# Patient Record
Sex: Male | Born: 1960
Health system: Southern US, Community
[De-identification: ages and names within clinical notes are randomized; demographics above are authoritative.]

## PROBLEM LIST (undated history)

## (undated) DIAGNOSIS — I10 Essential (primary) hypertension: Secondary | ICD-10-CM

## (undated) DIAGNOSIS — Z96649 Presence of unspecified artificial hip joint: Secondary | ICD-10-CM

## (undated) DIAGNOSIS — R0600 Dyspnea, unspecified: Secondary | ICD-10-CM

## (undated) DIAGNOSIS — I509 Heart failure, unspecified: Secondary | ICD-10-CM

## (undated) HISTORY — PX: TOTAL HIP ARTHROPLASTY: SHX124

---

## 2020-10-14 ENCOUNTER — Encounter (HOSPITAL_COMMUNITY): Payer: Self-pay | Admitting: Internal Medicine

## 2020-10-14 ENCOUNTER — Inpatient Hospital Stay (HOSPITAL_COMMUNITY)
Admission: EM | Admit: 2020-10-14 | Discharge: 2020-10-22 | DRG: 286 | Disposition: A | Discharge status: Home / Self Care | Payer: Medicaid Other | Attending: Internal Medicine | Admitting: Internal Medicine

## 2020-10-14 ENCOUNTER — Emergency Department (HOSPITAL_COMMUNITY): Payer: Medicaid Other

## 2020-10-14 ENCOUNTER — Inpatient Hospital Stay (HOSPITAL_COMMUNITY): Payer: Medicaid Other

## 2020-10-14 ENCOUNTER — Other Ambulatory Visit: Payer: Self-pay

## 2020-10-14 DIAGNOSIS — I509 Heart failure, unspecified: Secondary | ICD-10-CM | POA: Diagnosis not present

## 2020-10-14 DIAGNOSIS — Z79899 Other long term (current) drug therapy: Secondary | ICD-10-CM | POA: Diagnosis not present

## 2020-10-14 DIAGNOSIS — I493 Ventricular premature depolarization: Secondary | ICD-10-CM | POA: Diagnosis present

## 2020-10-14 DIAGNOSIS — I472 Ventricular tachycardia: Secondary | ICD-10-CM | POA: Diagnosis present

## 2020-10-14 DIAGNOSIS — Z96643 Presence of artificial hip joint, bilateral: Secondary | ICD-10-CM | POA: Diagnosis present

## 2020-10-14 DIAGNOSIS — I5021 Acute systolic (congestive) heart failure: Secondary | ICD-10-CM

## 2020-10-14 DIAGNOSIS — I952 Hypotension due to drugs: Secondary | ICD-10-CM | POA: Diagnosis present

## 2020-10-14 DIAGNOSIS — R7303 Prediabetes: Secondary | ICD-10-CM | POA: Diagnosis present

## 2020-10-14 DIAGNOSIS — I130 Hypertensive heart and chronic kidney disease with heart failure and stage 1 through stage 4 chronic kidney disease, or unspecified chronic kidney disease: Principal | ICD-10-CM | POA: Diagnosis present

## 2020-10-14 DIAGNOSIS — Z20822 Contact with and (suspected) exposure to covid-19: Secondary | ICD-10-CM | POA: Diagnosis present

## 2020-10-14 DIAGNOSIS — D509 Iron deficiency anemia, unspecified: Secondary | ICD-10-CM | POA: Diagnosis present

## 2020-10-14 DIAGNOSIS — G4733 Obstructive sleep apnea (adult) (pediatric): Secondary | ICD-10-CM | POA: Diagnosis present

## 2020-10-14 DIAGNOSIS — E869 Volume depletion, unspecified: Secondary | ICD-10-CM | POA: Diagnosis not present

## 2020-10-14 DIAGNOSIS — I513 Intracardiac thrombosis, not elsewhere classified: Secondary | ICD-10-CM | POA: Diagnosis present

## 2020-10-14 DIAGNOSIS — I5082 Biventricular heart failure: Secondary | ICD-10-CM | POA: Diagnosis present

## 2020-10-14 DIAGNOSIS — I10 Essential (primary) hypertension: Secondary | ICD-10-CM | POA: Diagnosis not present

## 2020-10-14 DIAGNOSIS — R0602 Shortness of breath: Secondary | ICD-10-CM | POA: Diagnosis present

## 2020-10-14 DIAGNOSIS — N170 Acute kidney failure with tubular necrosis: Secondary | ICD-10-CM | POA: Diagnosis present

## 2020-10-14 DIAGNOSIS — E669 Obesity, unspecified: Secondary | ICD-10-CM | POA: Diagnosis present

## 2020-10-14 DIAGNOSIS — Z6837 Body mass index (BMI) 37.0-37.9, adult: Secondary | ICD-10-CM | POA: Diagnosis not present

## 2020-10-14 DIAGNOSIS — I169 Hypertensive crisis, unspecified: Secondary | ICD-10-CM

## 2020-10-14 DIAGNOSIS — F1099 Alcohol use, unspecified with unspecified alcohol-induced disorder: Secondary | ICD-10-CM

## 2020-10-14 DIAGNOSIS — F1010 Alcohol abuse, uncomplicated: Secondary | ICD-10-CM | POA: Diagnosis present

## 2020-10-14 DIAGNOSIS — I2510 Atherosclerotic heart disease of native coronary artery without angina pectoris: Secondary | ICD-10-CM | POA: Diagnosis present

## 2020-10-14 DIAGNOSIS — I5023 Acute on chronic systolic (congestive) heart failure: Secondary | ICD-10-CM | POA: Diagnosis present

## 2020-10-14 DIAGNOSIS — N1831 Chronic kidney disease, stage 3a: Secondary | ICD-10-CM | POA: Diagnosis present

## 2020-10-14 DIAGNOSIS — T502X5A Adverse effect of carbonic-anhydrase inhibitors, benzothiadiazides and other diuretics, initial encounter: Secondary | ICD-10-CM | POA: Diagnosis present

## 2020-10-14 DIAGNOSIS — K7200 Acute and subacute hepatic failure without coma: Secondary | ICD-10-CM | POA: Diagnosis present

## 2020-10-14 DIAGNOSIS — E876 Hypokalemia: Secondary | ICD-10-CM | POA: Diagnosis present

## 2020-10-14 DIAGNOSIS — T501X5A Adverse effect of loop [high-ceiling] diuretics, initial encounter: Secondary | ICD-10-CM | POA: Diagnosis present

## 2020-10-14 DIAGNOSIS — N179 Acute kidney failure, unspecified: Secondary | ICD-10-CM | POA: Diagnosis not present

## 2020-10-14 DIAGNOSIS — F1020 Alcohol dependence, uncomplicated: Secondary | ICD-10-CM | POA: Diagnosis not present

## 2020-10-14 DIAGNOSIS — R7401 Elevation of levels of liver transaminase levels: Secondary | ICD-10-CM

## 2020-10-14 DIAGNOSIS — I428 Other cardiomyopathies: Secondary | ICD-10-CM | POA: Diagnosis present

## 2020-10-14 HISTORY — DX: Essential (primary) hypertension: I10

## 2020-10-14 HISTORY — DX: Heart failure, unspecified: I50.9

## 2020-10-14 HISTORY — DX: Dyspnea, unspecified: R06.00

## 2020-10-14 HISTORY — DX: Presence of unspecified artificial hip joint: Z96.649

## 2020-10-14 LAB — CBC
HCT: 37.6 % — ABNORMAL LOW (ref 39.0–52.0)
Hemoglobin: 11.6 g/dL — ABNORMAL LOW (ref 13.0–17.0)
MCH: 23.9 pg — ABNORMAL LOW (ref 26.0–34.0)
MCHC: 30.9 g/dL (ref 30.0–36.0)
MCV: 77.4 fL — ABNORMAL LOW (ref 80.0–100.0)
Platelets: 324 10*3/uL (ref 150–400)
RBC: 4.86 MIL/uL (ref 4.22–5.81)
RDW: 16.4 % — ABNORMAL HIGH (ref 11.5–15.5)
WBC: 5.2 10*3/uL (ref 4.0–10.5)
nRBC: 0 % (ref 0.0–0.2)

## 2020-10-14 LAB — TROPONIN I (HIGH SENSITIVITY)
Troponin I (High Sensitivity): 113 ng/L (ref <18)
Troponin I (High Sensitivity): 93 ng/L — ABNORMAL HIGH (ref <18)

## 2020-10-14 LAB — HEMOGLOBIN A1C
Hgb A1c MFr Bld: 6 % — ABNORMAL HIGH (ref 4.8–5.6)
Mean Plasma Glucose: 125.5 mg/dL

## 2020-10-14 LAB — CBC WITH DIFFERENTIAL/PLATELET
Abs Immature Granulocytes: 0.02 10*3/uL (ref 0.00–0.07)
Basophils Absolute: 0 10*3/uL (ref 0.0–0.1)
Basophils Relative: 1 %
Eosinophils Absolute: 0 10*3/uL (ref 0.0–0.5)
Eosinophils Relative: 1 %
HCT: 39.8 % (ref 39.0–52.0)
Hemoglobin: 12.2 g/dL — ABNORMAL LOW (ref 13.0–17.0)
Immature Granulocytes: 0 %
Lymphocytes Relative: 36 %
Lymphs Abs: 1.8 10*3/uL (ref 0.7–4.0)
MCH: 23.6 pg — ABNORMAL LOW (ref 26.0–34.0)
MCHC: 30.7 g/dL (ref 30.0–36.0)
MCV: 77.1 fL — ABNORMAL LOW (ref 80.0–100.0)
Monocytes Absolute: 0.6 10*3/uL (ref 0.1–1.0)
Monocytes Relative: 12 %
Neutro Abs: 2.6 10*3/uL (ref 1.7–7.7)
Neutrophils Relative %: 50 %
Platelets: 314 10*3/uL (ref 150–400)
RBC: 5.16 MIL/uL (ref 4.22–5.81)
RDW: 16.5 % — ABNORMAL HIGH (ref 11.5–15.5)
WBC: 5.1 10*3/uL (ref 4.0–10.5)
nRBC: 0 % (ref 0.0–0.2)

## 2020-10-14 LAB — LIPID PANEL
Cholesterol: 97 mg/dL (ref 0–200)
HDL: 31 mg/dL — ABNORMAL LOW (ref >40)
LDL Cholesterol: 56 mg/dL (ref 0–99)
Total CHOL/HDL Ratio: 3.1 RATIO
Triglycerides: 48 mg/dL (ref <150)
VLDL: 10 mg/dL (ref 0–40)

## 2020-10-14 LAB — BASIC METABOLIC PANEL
Anion gap: 11 (ref 5–15)
BUN: 18 mg/dL (ref 6–20)
CO2: 27 mmol/L (ref 22–32)
Calcium: 9 mg/dL (ref 8.9–10.3)
Chloride: 97 mmol/L — ABNORMAL LOW (ref 98–111)
Creatinine, Ser: 1.46 mg/dL — ABNORMAL HIGH (ref 0.61–1.24)
GFR, Estimated: 55 mL/min — ABNORMAL LOW (ref >60)
Glucose, Bld: 110 mg/dL — ABNORMAL HIGH (ref 70–99)
Potassium: 4.2 mmol/L (ref 3.5–5.1)
Sodium: 135 mmol/L (ref 135–145)

## 2020-10-14 LAB — ECHOCARDIOGRAM COMPLETE
Calc EF: 25.2 %
Height: 75 in
S' Lateral: 5.7 cm
Single Plane A2C EF: 22.5 %
Single Plane A4C EF: 32.1 %
Weight: 4560 oz

## 2020-10-14 LAB — URINALYSIS, ROUTINE W REFLEX MICROSCOPIC
Bilirubin Urine: NEGATIVE
Glucose, UA: NEGATIVE mg/dL
Hgb urine dipstick: NEGATIVE
Ketones, ur: NEGATIVE mg/dL
Leukocytes,Ua: NEGATIVE
Nitrite: NEGATIVE
Protein, ur: NEGATIVE mg/dL
Specific Gravity, Urine: 1.005 (ref 1.005–1.030)
pH: 5 (ref 5.0–8.0)

## 2020-10-14 LAB — TSH: TSH: 1.243 u[IU]/mL (ref 0.350–4.500)

## 2020-10-14 LAB — HIV ANTIBODY (ROUTINE TESTING W REFLEX): HIV Screen 4th Generation wRfx: NONREACTIVE

## 2020-10-14 LAB — RESP PANEL BY RT-PCR (FLU A&B, COVID) ARPGX2
Influenza A by PCR: NEGATIVE
Influenza B by PCR: NEGATIVE
SARS Coronavirus 2 by RT PCR: NEGATIVE

## 2020-10-14 LAB — HEPATIC FUNCTION PANEL
ALT: 34 U/L (ref 0–44)
AST: 28 U/L (ref 15–41)
Albumin: 3.1 g/dL — ABNORMAL LOW (ref 3.5–5.0)
Alkaline Phosphatase: 61 U/L (ref 38–126)
Bilirubin, Direct: 0.3 mg/dL — ABNORMAL HIGH (ref 0.0–0.2)
Indirect Bilirubin: 0.7 mg/dL (ref 0.3–0.9)
Total Bilirubin: 1 mg/dL (ref 0.3–1.2)
Total Protein: 7.7 g/dL (ref 6.5–8.1)

## 2020-10-14 LAB — HEPARIN LEVEL (UNFRACTIONATED): Heparin Unfractionated: 0.29 IU/mL — ABNORMAL LOW (ref 0.30–0.70)

## 2020-10-14 LAB — BRAIN NATRIURETIC PEPTIDE: B Natriuretic Peptide: 2938.5 pg/mL — ABNORMAL HIGH (ref 0.0–100.0)

## 2020-10-14 MED ORDER — FUROSEMIDE 10 MG/ML IJ SOLN
40.0000 mg | Freq: Three times a day (TID) | INTRAMUSCULAR | Status: DC
  Administered 2020-10-14 – 2020-10-15 (×3): 40 mg via INTRAVENOUS
  Filled 2020-10-14 (×3): qty 4

## 2020-10-14 MED ORDER — ACETAMINOPHEN 325 MG PO TABS: 650.0000 mg | ORAL_TABLET | ORAL | Status: DC | PRN

## 2020-10-14 MED ORDER — SODIUM CHLORIDE 0.9% FLUSH
3.0000 mL | Freq: Two times a day (BID) | INTRAVENOUS | Status: DC
  Administered 2020-10-14 – 2020-10-16 (×5): 3 mL via INTRAVENOUS

## 2020-10-14 MED ORDER — SODIUM CHLORIDE 0.9 % IV SOLN: 250.0000 mL | INTRAVENOUS | Status: DC | PRN

## 2020-10-14 MED ORDER — PERFLUTREN LIPID MICROSPHERE
1.0000 mL | INTRAVENOUS | Status: AC | PRN
  Administered 2020-10-14: 4 mL via INTRAVENOUS
  Filled 2020-10-14: qty 10

## 2020-10-14 MED ORDER — HEPARIN BOLUS VIA INFUSION
7000.0000 [IU] | Freq: Once | INTRAVENOUS | Status: AC
  Administered 2020-10-14: 7000 [IU] via INTRAVENOUS
  Filled 2020-10-14: qty 7000

## 2020-10-14 MED ORDER — FUROSEMIDE 10 MG/ML IJ SOLN: 40.0000 mg | Freq: Two times a day (BID) | INTRAMUSCULAR | Status: DC

## 2020-10-14 MED ORDER — ASPIRIN 81 MG PO CHEW
324.0000 mg | CHEWABLE_TABLET | Freq: Once | ORAL | Status: AC
  Administered 2020-10-14: 324 mg via ORAL
  Filled 2020-10-14: qty 4

## 2020-10-14 MED ORDER — HEPARIN (PORCINE) 25000 UT/250ML-% IV SOLN
2000.0000 [IU]/h | INTRAVENOUS | Status: DC
  Administered 2020-10-14: 1800 [IU]/h via INTRAVENOUS
  Administered 2020-10-15: 2000 [IU]/h via INTRAVENOUS
  Administered 2020-10-15: 2200 [IU]/h via INTRAVENOUS
  Administered 2020-10-16 – 2020-10-17 (×2): 2100 [IU]/h via INTRAVENOUS
  Filled 2020-10-14 (×6): qty 250

## 2020-10-14 MED ORDER — FOLIC ACID 1 MG PO TABS
1.0000 mg | ORAL_TABLET | Freq: Every day | ORAL | Status: DC
  Administered 2020-10-14 – 2020-10-22 (×9): 1 mg via ORAL
  Filled 2020-10-14 (×9): qty 1

## 2020-10-14 MED ORDER — SODIUM CHLORIDE 0.9% FLUSH: 3.0000 mL | INTRAVENOUS | Status: DC | PRN

## 2020-10-14 MED ORDER — FUROSEMIDE 10 MG/ML IJ SOLN
40.0000 mg | Freq: Once | INTRAMUSCULAR | Status: AC
  Administered 2020-10-14: 40 mg via INTRAVENOUS
  Filled 2020-10-14: qty 4

## 2020-10-14 MED ORDER — LOSARTAN POTASSIUM 50 MG PO TABS
25.0000 mg | ORAL_TABLET | Freq: Every day | ORAL | Status: DC
  Administered 2020-10-14: 25 mg via ORAL
  Filled 2020-10-14: qty 1

## 2020-10-14 MED ORDER — SACUBITRIL-VALSARTAN 49-51 MG PO TABS
1.0000 | ORAL_TABLET | Freq: Two times a day (BID) | ORAL | Status: DC
  Administered 2020-10-14 – 2020-10-18 (×8): 1 via ORAL
  Filled 2020-10-14 (×9): qty 1

## 2020-10-14 MED ORDER — THIAMINE HCL 100 MG PO TABS
100.0000 mg | ORAL_TABLET | Freq: Every day | ORAL | Status: DC
  Administered 2020-10-14 – 2020-10-22 (×9): 100 mg via ORAL
  Filled 2020-10-14 (×9): qty 1

## 2020-10-14 MED ORDER — ONDANSETRON HCL 4 MG/2ML IJ SOLN: 4.0000 mg | Freq: Four times a day (QID) | INTRAMUSCULAR | Status: DC | PRN

## 2020-10-14 MED ORDER — POLYETHYLENE GLYCOL 3350 17 G PO PACK: 17.0000 g | PACK | Freq: Every day | ORAL | Status: DC | PRN

## 2020-10-14 MED ORDER — THIAMINE HCL 100 MG/ML IJ SOLN
100.0000 mg | Freq: Every day | INTRAMUSCULAR | Status: DC
  Filled 2020-10-14: qty 2

## 2020-10-14 MED ORDER — ADULT MULTIVITAMIN W/MINERALS CH
1.0000 | ORAL_TABLET | Freq: Every day | ORAL | Status: DC
  Administered 2020-10-14 – 2020-10-22 (×9): 1 via ORAL
  Filled 2020-10-14 (×9): qty 1

## 2020-10-14 MED ORDER — DIGOXIN 125 MCG PO TABS
0.1250 mg | ORAL_TABLET | Freq: Every day | ORAL | Status: DC
  Administered 2020-10-14 – 2020-10-22 (×9): 0.125 mg via ORAL
  Filled 2020-10-14 (×9): qty 1

## 2020-10-14 MED ORDER — ENOXAPARIN SODIUM 40 MG/0.4ML ~~LOC~~ SOLN: 40.0000 mg | SUBCUTANEOUS | Status: DC

## 2020-10-14 NOTE — Progress Notes (Signed)
Echocardiogram 2D Echocardiogram has been performed.  Paul [name] 10/14/2020, 11:20 AM   Notified Dr. Bjorn Pippin of stat result at 11:20

## 2020-10-14 NOTE — Progress Notes (Signed)
Patient admitted to room 5C12 in NAD, VS stable and patient free from pain. Patient oriented to room and call bell in reach.

## 2020-10-14 NOTE — ED Provider Notes (Addendum)
[facility] EMERGENCY DEPARTMENT Provider Note   CSN: [id number] Arrival date & time: 10/14/20  4098     History Chief Complaint  Patient presents with  . Shortness of Breath    Paul [name] is a 59 y.o. male.  HPI      In June noticed some dyspnea when going fishing, started to noticing some dyspnea at night. Got to the point that walking around and breathing difficult. Over the last 2 weeks has had more mucus build up, swelling in the legs. Shortness of breath with minimal exertion.  Has hx of activity.  At night dyspnea, recurring cough at night, phlegm coming up last few nights clear mostly, slight yellow. After clearing it it improves.  Was taking OTC mucinex, alka seltzer expectorant which drove blood pressure up. Started eating right, BP improving but still high.  Not on medicine.  Has not had a doctor because lost insurance.  No medications but apple cider vinegar.   No smoking, with covid less active, having a drink or 2 each night but has not had one in 3 weeks.  No chest pain, when cough has some No fevers or chills, no lack of taste/smell, neg covid test     Past Medical History:  Diagnosis Date  . History of hip replacement   . Hypertension     Patient Active Problem List   Diagnosis Date Noted  . Acute heart failure (HCC) 10/14/2020    Past Surgical History:  Procedure Laterality Date  . TOTAL HIP ARTHROPLASTY         Family History  Problem Relation Age of Onset  . Pulmonary Hypertension Mother   . Hypertension Mother   . CAD Father        s/p CABG x 4  . Heart failure Father   . Heart failure Sister   . Hypertension Sister     Social History   Tobacco Use  . Smoking status: Never Smoker  . Smokeless tobacco: Never Used  Substance Use Topics  . Alcohol use: Yes  . Drug use: Not on file    Home Medications Prior to Admission medications   Medication Sig Start Date End Date Taking? Authorizing Provider   DM-APAP-CPM (CORICIDIN HBP PO) Take 1 tablet by mouth daily as needed (congestion).   Yes [provider]  Multiple Vitamin (MULTIVITAMIN WITH MINERALS) TABS tablet Take 1 tablet by mouth daily.   Yes [provider]  Multiple Vitamins-Minerals (ZINC PO) Take 1 tablet by mouth daily.   Yes [provider]  Phenyleph-Doxylamine-DM-APAP (ALKA-SELTZER PLS NIGHT CLD/FLU) 5-6.25-10-325 MG CAPS Take 1 tablet by mouth at bedtime as needed (congestion).   Yes [provider]  sodium chloride (OCEAN) 0.65 % SOLN nasal spray Place 1 spray into both nostrils as needed for congestion.   Yes [provider]    Allergies    Shellfish allergy  Review of Systems   Review of Systems  Constitutional: Negative for fatigue and fever.  HENT: Negative for congestion.   Respiratory: Positive for cough and shortness of breath.   Cardiovascular: Positive for leg swelling. Negative for chest pain.  Gastrointestinal: Positive for constipation. Negative for abdominal pain, diarrhea, nausea and vomiting.  Genitourinary: Negative for dysuria.  Musculoskeletal: Negative for back pain.  Skin: Negative for rash.  Neurological: Negative for headaches (when BP high will have some pressure).    Physical Exam Updated Vital Signs BP (!) 178/112 (BP Location: Right Arm)   Pulse (!) 104  Temp 99.2 F (37.3 C) (Oral)   Resp 16   Ht 6\' 3"  (1.905 m)   Wt 129.3 kg   SpO2 97%   BMI 35.62 kg/m   Physical Exam Vitals and nursing note reviewed.  Constitutional:      General: He is not in acute distress.    Appearance: He is well-developed. He is not diaphoretic.  HENT:     Head: Normocephalic and atraumatic.  Eyes:     Conjunctiva/sclera: Conjunctivae normal.  Neck:     Vascular: JVD present.  Cardiovascular:     Rate and Rhythm: Normal rate and regular rhythm.     Heart sounds: Normal heart sounds. No murmur heard.  No friction rub. No gallop.   Pulmonary:      Effort: Pulmonary effort is normal. No respiratory distress.     Breath sounds: Decreased breath sounds (bases bilaterally) present. No wheezing or rales.  Abdominal:     General: There is no distension.     Palpations: Abdomen is soft.     Tenderness: There is no abdominal tenderness. There is no guarding.  Musculoskeletal:     Cervical back: Normal range of motion.     Right lower leg: Edema present.     Left lower leg: Edema present.  Skin:    General: Skin is warm and dry.  Neurological:     Mental Status: He is alert and oriented to person, place, and time.     ED Results / Procedures / Treatments   Labs (all labs ordered are listed, but only abnormal results are displayed) Labs Reviewed  BASIC METABOLIC PANEL - Abnormal; Notable for the following components:      Result Value   Chloride 97 (*)    Glucose, Bld 110 (*)    Creatinine, Ser 1.46 (*)    GFR, Estimated 55 (*)    All other components within normal limits  CBC - Abnormal; Notable for the following components:   Hemoglobin 11.6 (*)    HCT 37.6 (*)    MCV 77.4 (*)    MCH 23.9 (*)    RDW 16.4 (*)    All other components within normal limits  HEPATIC FUNCTION PANEL - Abnormal; Notable for the following components:   Albumin 3.1 (*)    Bilirubin, Direct 0.3 (*)    All other components within normal limits  BRAIN NATRIURETIC PEPTIDE - Abnormal; Notable for the following components:   B Natriuretic Peptide 2,938.5 (*)    All other components within normal limits  CBC WITH DIFFERENTIAL/PLATELET - Abnormal; Notable for the following components:   Hemoglobin 12.2 (*)    MCV 77.1 (*)    MCH 23.6 (*)    RDW 16.5 (*)    All other components within normal limits  LIPID PANEL - Abnormal; Notable for the following components:   HDL 31 (*)    All other components within normal limits  HEMOGLOBIN A1C - Abnormal; Notable for the following components:   Hgb A1c MFr Bld 6.0 (*)    All other components within normal  limits  URINALYSIS, ROUTINE W REFLEX MICROSCOPIC - Abnormal; Notable for the following components:   Color, Urine STRAW (*)    All other components within normal limits  TROPONIN I (HIGH SENSITIVITY) - Abnormal; Notable for the following components:   Troponin I (High Sensitivity) 113 (*)    All other components within normal limits  TROPONIN I (HIGH SENSITIVITY) - Abnormal; Notable for the following components:  Troponin I (High Sensitivity) 93 (*)    All other components within normal limits  RESP PANEL BY RT-PCR (FLU A&B, COVID) ARPGX2  HIV ANTIBODY (ROUTINE TESTING W REFLEX)  TSH  HEPARIN LEVEL (UNFRACTIONATED)  BASIC METABOLIC PANEL  MAGNESIUM  HEPARIN LEVEL (UNFRACTIONATED)  CBC    EKG EKG Interpretation  Date/Time:  Monday October 14 2020 07:25:51 EST Ventricular Rate:  112 PR Interval:  176 QRS Duration: 82 QT Interval:  348 QTC Calculation: 475 R Axis:   -95 Text Interpretation: Sinus tachycardia with Premature atrial complexes Right superior axis deviation Inferior infarct , age undetermined Anterior infarct , age undetermined Abnormal ECG No previous ECGs available Confirmed by Alvira Monday ([id number]) on 10/14/2020 8:12:10 AM   Radiology DG Chest 2 View  Result Date: 10/14/2020 CLINICAL DATA:  Shortness of breath EXAM: CHEST - 2 VIEW COMPARISON:  None. FINDINGS: Pulmonary vascular congestion. Enlargement of the main pulmonary artery suggesting pulmonary arterial hypertension. Bilateral hilar prominence is likely related to this. No pleural effusion or pneumothorax. Cardiomegaly. No acute osseous abnormality IMPRESSION: Pulmonary vascular congestion and cardiomegaly. Likely pulmonary arterial hypertension. Electronically Signed   By: Guadlupe Spanish M.D.   On: 10/14/2020 07:58   ECHOCARDIOGRAM COMPLETE  Result Date: 10/14/2020    ECHOCARDIOGRAM REPORT   Patient Name:   JULLIEN GRANQUIST Date of Exam: 10/14/2020 Medical Rec #:  [id number]     Height:       75.0 in  Accession #:    [id number]    Weight:       285.0 lb Date of Birth:  [birth year]     BSA:          2.551 m Patient Age:    59 years      BP:           152/132 mmHg Patient Gender: M             HR:           76 bpm. Exam Location:  Inpatient Procedure: 2D Echo, 3D Echo, Color Doppler, Cardiac Doppler and Intracardiac            Opacification Agent STAT ECHO  Results reported to Dr Nedra Hai and Dr Mayford Knife at 11:37. Indications:    I50.21 Acute systolic (congestive) heart failure  History:        Patient has no prior history of Echocardiogram examinations.                 Risk Factors:Hypertension.  Sonographer:    Irving Burton Senior RDCS Referring Phys: [id number] Tamarra Geiselman IMPRESSIONS  1. Left ventricular ejection fraction, by estimation, is 20 to 25%. The left ventricle has severely decreased function. The left ventricle demonstrates global hypokinesis. The left ventricular internal cavity size was mildly dilated. There is mild left ventricular hypertrophy. Left ventricular diastolic parameters are indeterminate.  2. LV apical thrombus measuring 2.4 cm x 1.3 cm  3. Right ventricular systolic function is moderately reduced. The right ventricular size is mildly enlarged. Tricuspid regurgitation signal is inadequate for assessing PA pressure.  4. Left atrial size was moderately dilated.  5. Right atrial size was moderately dilated.  6. The mitral valve is normal in structure. Mild mitral valve regurgitation.  7. The aortic valve is tricuspid. Aortic valve regurgitation is not visualized. No aortic stenosis is present.  8. Aortic dilatation noted. There is mild dilatation of the ascending aorta, measuring 37 mm.  9. The inferior vena cava is dilated in size with <50% respiratory variability,  suggesting right atrial pressure of 15 mmHg. FINDINGS  Left Ventricle: Left ventricular ejection fraction, by estimation, is 20 to 25%. The left ventricle has severely decreased function. The left ventricle demonstrates global hypokinesis.  Definity contrast agent was given IV to delineate the left ventricular endocardial borders. The left ventricular internal cavity size was mildly dilated. There is mild left ventricular hypertrophy. Left ventricular diastolic parameters are indeterminate. Right Ventricle: The right ventricular size is mildly enlarged. Right vetricular wall thickness was not assessed. Right ventricular systolic function is moderately reduced. Tricuspid regurgitation signal is inadequate for assessing PA pressure. Left Atrium: Left atrial size was moderately dilated. Right Atrium: Right atrial size was moderately dilated. Pericardium: There is no evidence of pericardial effusion. Mitral Valve: The mitral valve is normal in structure. Mild mitral valve regurgitation. Tricuspid Valve: The tricuspid valve is normal in structure. Tricuspid valve regurgitation is trivial. Aortic Valve: The aortic valve is tricuspid. Aortic valve regurgitation is not visualized. No aortic stenosis is present. Pulmonic Valve: The pulmonic valve was not well visualized. Pulmonic valve regurgitation is not visualized. Aorta: The aortic root is normal in size and structure and aortic dilatation noted. There is mild dilatation of the ascending aorta, measuring 37 mm. Venous: The inferior vena cava is dilated in size with less than 50% respiratory variability, suggesting right atrial pressure of 15 mmHg. IAS/Shunts: The interatrial septum was not well visualized.  LEFT VENTRICLE PLAX 2D LVIDd:         5.90 cm LVIDs:         5.70 cm LV PW:         1.30 cm LV IVS:        1.10 cm LVOT diam:     2.10 cm      3D Volume EF: LV SV:         29           3D EF:        24 % LV SV Index:   11           LV EDV:       344 ml LVOT Area:     3.46 cm     LV ESV:       263 ml                             LV SV:        82 ml  LV Volumes (MOD) LV vol d, MOD A2C: 182.0 ml LV vol d, MOD A4C: 196.0 ml LV vol s, MOD A2C: 141.0 ml LV vol s, MOD A4C: 133.0 ml LV SV MOD A2C:     41.0 ml LV  SV MOD A4C:     196.0 ml LV SV MOD BP:      48.7 ml RIGHT VENTRICLE RV S prime:     7.07 cm/s TAPSE (M-mode): 0.9 cm LEFT ATRIUM              Index       RIGHT ATRIUM           Index LA diam:        5.60 cm  2.20 cm/m  RA Area:     29.50 cm LA Vol (A2C):   141.0 ml 55.28 ml/m RA Volume:   99.50 ml  39.01 ml/m LA Vol (A4C):   86.6 ml  33.95 ml/m LA Biplane Vol: 116.0 ml 45.48 ml/m  AORTIC VALVE  LVOT Vmax:   55.90 cm/s LVOT Vmean:  39.667 cm/s LVOT VTI:    0.085 m  AORTA Ao Root diam: 3.80 cm Ao Asc diam:  3.70 cm  SHUNTS Systemic VTI:  0.08 m Systemic Diam: 2.10 cm Epifanio Lesches MD Electronically signed by Epifanio Lesches MD Signature Date/Time: 10/14/2020/11:38:11 AM    Final     Procedures .Critical Care Performed by: Alvira Monday, MD Authorized by: Alvira Monday, MD   Critical care provider statement:    Critical care time (minutes):  30   Critical care was time spent personally by me on the following activities:  Examination of patient, ordering and performing treatments and interventions, ordering and review of laboratory studies, ordering and review of radiographic studies, pulse oximetry, re-evaluation of patient's condition, obtaining history from patient or surrogate and review of old charts   (including critical care time)  Medications Ordered in ED Medications  sodium chloride flush (NS) 0.9 % injection 3 mL (3 mLs Intravenous Given 10/14/20 2057)  sodium chloride flush (NS) 0.9 % injection 3 mL (has no administration in time range)  0.9 %  sodium chloride infusion (has no administration in time range)  acetaminophen (TYLENOL) tablet 650 mg (has no administration in time range)  ondansetron (ZOFRAN) injection 4 mg (has no administration in time range)  thiamine tablet 100 mg (100 mg Oral Given 10/14/20 1336)    Or  thiamine (B-1) injection 100 mg ( Intravenous See Alternative 10/14/20 1336)  folic acid (FOLVITE) tablet 1 mg (1 mg Oral Given 10/14/20 1336)   multivitamin with minerals tablet 1 tablet (1 tablet Oral Given 10/14/20 1336)  polyethylene glycol (MIRALAX / GLYCOLAX) packet 17 g (has no administration in time range)  perflutren lipid microspheres (DEFINITY) IV suspension (4 mLs Intravenous Given 10/14/20 1122)  furosemide (LASIX) injection 40 mg (40 mg Intravenous Given 10/14/20 2056)  heparin ADULT infusion 100 units/mL (25000 units/250mL sodium chloride 0.45%) (1,800 Units/hr Intravenous Rate/Dose Verify 10/14/20 2000)  sacubitril-valsartan (ENTRESTO) 49-51 mg per tablet (1 tablet Oral Given 10/14/20 2143)  digoxin (LANOXIN) tablet 0.125 mg (0.125 mg Oral Given 10/14/20 2056)  furosemide (LASIX) injection 40 mg (40 mg Intravenous Given 10/14/20 0923)  aspirin chewable tablet 324 mg (324 mg Oral Given 10/14/20 1027)  heparin bolus via infusion 7,000 Units (7,000 Units Intravenous Bolus from Bag 10/14/20 1442)    ED Course  I have reviewed the triage vital signs and the nursing notes.  Pertinent labs & imaging results that were available during my care of the patient were reviewed by me and considered in my medical decision making (see chart for details).    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  Differential diagnosis for dyspnea includes ACS, PE, COPD exacerbation, CHF exacerbation, anemia, pneumonia, viral etiology such as COVID 19 infection, metabolic abnormality.  Chest x-ray was done which showed signs consistent with pulmonary hypertensoin. EKG was evaluated by me which showed nonspecific findings.  BNP pending at time of transfer of care.  Doubt PE given slow progressive of symptoms, no known risk factors.  Suspect CHF or pulmonary hypertension related to untreated  hypertension. Has signs of volume overload on exam, and given  significant dyspnea with minimal exertion will admit for continued care and evaluation. Placed on O2 for comfort in the ED.  Troponin elevated to 113, however given history feel this is likely secondary to heart strain in setting of htn/CHF and unlikely ACS.  Given asa, lasix and admitted for further care.   Final Clinical Impression(s) / ED Diagnoses Final diagnoses:  Acute on chronic congestive heart failure, unspecified heart failure type (HCC)  Shortness of breath    Rx / DC Orders ED Discharge Orders    None       Alvira Monday, MD 10/14/20 2152    Alvira Monday, MD 10/26/20 1519

## 2020-10-14 NOTE — ED Notes (Signed)
Pt sitting up for respiratory assessment. During which pt became increasing more labored with breathing and SpO2 dropped to 90%. Placed on 2 liters [place] for comfort and more ease of breathing. SpO2 is 100% with 2 liters

## 2020-10-14 NOTE — Progress Notes (Signed)
ANTICOAGULATION CONSULT NOTE  Pharmacy Consult for Heparin Indication: LV Thrombus   Not on File  Patient Measurements: Height: 6\' 3"  (190.5 cm) Weight: 129.3 kg (285 lb) IBW/kg (Calculated) : 84.5 Heparin Dosing Weight: 112.7 kg  Vital Signs: Temp: 98 F (36.7 C) (11/29 1100) Temp Source: Oral (11/29 1100) BP: 166/102 (11/29 1345) Pulse Rate: 101 (11/29 1345)  Labs: Recent Labs    10/14/20 0732 10/14/20 0834 10/14/20 1133  HGB 11.6* 12.2*  --   HCT 37.6* 39.8  --   PLT 324 314  --   CREATININE 1.46*  --   --   TROPONINIHS  --  113* 93*    Estimated Creatinine Clearance: 77.9 mL/min (A) (by C-G formula based on SCr of 1.46 mg/dL (H)).   Medical History: No past medical history on file.  Medications:  Scheduled:  . folic acid  1 mg Oral Daily  . furosemide  40 mg Intravenous TID  . heparin  7,000 Units Intravenous Once  . multivitamin with minerals  1 tablet Oral Daily  . sodium chloride flush  3 mL Intravenous Q12H  . thiamine  100 mg Oral Daily   Or  . thiamine  100 mg Intravenous Daily    Assessment: Patient is a 59 yom that is being admitted for AHF, and an LV thrombus. The patient was found also have elevated trop levels. Pharmacy has been asked to dose heparin at this time for the patients LV thrombus. The patient does not appear to be on any anticoagulation PTA.  Goal of Therapy:  Heparin level 0.3-0.7 units/ml Monitor platelets by anticoagulation protocol: Yes   Plan:  - Heparin bolus 7000 units IV x 1 dose - Heparin drip @ 1800  units/hr - Heparin level in ~ 8hours  - Monitor patient for s/s of bleeding and CBC while on heparin   10/16/20 PharmD. BCPS  10/14/2020,2:01 PM

## 2020-10-14 NOTE — Consult Note (Addendum)
Advanced Heart Failure Team Consult Note   Primary Physician: Patient, No Pcp Per  PCP-Cardiologist:  New Reason for Consultation: Acute systolic heart failure and LV thrombus   HPI:    Paul [name] is seen today for evaluation of acute systolic heart failure + LV Thrombus at the request of Dr. Mayford Knife, Internal Medicine.  Has family h/o significant for CHF (father and sister), CAD (father had CABG x 4 in his 27s) and PH (mother, she is still living, 12 y/o and retired Char[employer] fundraiser)  59 y/o male w/ h/o systemic HTN but not currently on medication as well as h/o ETOH use (2-3 drinks/day), presenting to the [facility] ED w/ progressive dyspnea, LEE, abdominal distention, orthopnea and BNP and found to be in acute CHF and markedly hypertensive, BP 190/169.  Symptoms started ~2 weeks ago and progressive. Also notes increased exertional fatigue, gets SOB and tired walking to mail box. Denies CP.   In ED, COVID negative (completed COVID vaccination 3/21). BNP elevated at 2,938. CXR shows cardiomegaly + pulmonary vascular congestion. EKG w/ sinus tach 112 bpm + PACs, ? Remote inferior/ anterior infarcts.  Hs trop 113>>93. Hgb 12. SCr 1.46. K 4.2. HFTs WNL. TSH normal. HIV NR. Hgb A1c 6.0. LDL 56.   He is being admitted by IM and has been started on IV Lasix 40 mg bid. Good UOP thus far.   Echo shows biventricular heart failure. LVEF 20-25% w/ global hypokinesis, mild LVH. RV systolic function moderately reduced. There is an LV apical thrombus measuring 2.4 cm x 1.3 cm. He has been started on IV heparin.   His girlfriend also reports s/s concerning for OSA, history of snoring, apneic spells during sleep and daytime fatigue.    2D Echo 10/14/20 Left ventricular ejection fraction, by estimation, is 20 to 25%. The left ventricle has severely decreased function. The left ventricle demonstrates global hypokinesis. The left ventricular internal cavity size was mildly dilated. There is mild left ventricular  hypertrophy. Left ventricular diastolic parameters are indeterminate. 2. LV apical thrombus measuring 2.4 cm x 1.3 cm 3. Right ventricular systolic function is moderately reduced. The right ventricular size is mildly enlarged. Tricuspid regurgitation signal is inadequate for assessing PA pressure. 4. Left atrial size was moderately dilated. 5. Right atrial size was moderately dilated. 6. The mitral valve is normal in structure. Mild mitral valve regurgitation. 7. The aortic valve is tricuspid. Aortic valve regurgitation is not visualized. No aortic stenosis is present. 8. Aortic dilatation noted. There is mild dilatation of the ascending aorta, measuring 37 mm. 9. The inferior vena cava is dilated in size with <50% respiratory variability, suggesting right atrial pressure of 15 mmHg.  Review of Systems: [y] = yes, [ ]  = no   . General: Weight gain [ ] ; Weight loss [ ] ; Anorexia [ ] ; Fatigue [Y ]; Fever [ ] ; Chills [ ] ; Weakness [ Y]  . Cardiac: Chest pain/pressure [ ] ; Resting SOB [ Y]; Exertional SOB [ Y]; Orthopnea [ Y]; Pedal Edema [ Y]; Palpitations [ ] ; Syncope [ ] ; Presyncope [ ] ; Paroxysmal nocturnal dyspnea[Y ]  . Pulmonary: Cough [Y ]; Wheezing[Y ]; Hemoptysis[ ] ; Sputum [ ] ; Snoring [ Y]  . GI: Vomiting[ ] ; Dysphagia[ ] ; Melena[ ] ; Hematochezia [ ] ; Heartburn[ ] ; Abdominal pain [ ] ; Constipation [Y ]; Diarrhea [ ] ; BRBPR [ ]   . GU: Hematuria[ ] ; Dysuria [ ] ; Nocturia[ ]   . Vascular: Pain in legs with walking [ ] ; Pain in feet with lying flat [ ] ;  Non-healing sores [ ] ; Stroke [ ] ; TIA [ ] ; Slurred speech [ ] ;  . Neuro: Headaches[ ] ; Vertigo[ ] ; Seizures[ ] ; Paresthesias[ ] ;Blurred vision [ ] ; Diplopia [ ] ; Vision changes [ ]   . Ortho/Skin: Arthritis [ ] ; Joint pain [ ] ; Muscle pain [ ] ; Joint swelling [ ] ; Back Pain [ ] ; Rash [ ]   . Psych: Depression[ ] ; Anxiety[ ]   . Heme: Bleeding problems [ ] ; Clotting disorders [ ] ; Anemia [ ]   . Endocrine: Diabetes [ ] ; Thyroid dysfunction[  ]  Home Medications Prior to Admission medications   Not on File    Past Medical History: Past Medical History:  Diagnosis Date  . History of hip replacement   . Hypertension     Past Surgical History: Past Surgical History:  Procedure Laterality Date  . TOTAL HIP ARTHROPLASTY      Family History: Family History  Problem Relation Age of Onset  . Pulmonary Hypertension Mother   . Hypertension Mother   . CAD Father        s/p CABG x 4  . Heart failure Father   . Heart failure Sister   . Hypertension Sister     Social History: Social History   Socioeconomic History  . Marital status: Divorced    Spouse name: Not on file  . Number of children: Not on file  . Years of education: Not on file  . Highest education level: Not on file  Occupational History  . Not on file  Tobacco Use  . Smoking status: Never Smoker  . Smokeless tobacco: Never Used  Substance and Sexual Activity  . Alcohol use: Yes  . Drug use: Not on file  . Sexual activity: Not on file  Other Topics Concern  . Not on file  Social History Narrative  . Not on file   Social Determinants of Health   Financial Resource Strain:   . Difficulty of Paying Living Expenses: Not on file  Food Insecurity:   . Worried About in the Last Year: Not on file  . Ran Out of Food in the Last Year: Not on file  Transportation Needs:   . Lack of Transportation (Medical): Not on file  . Lack of Transportation (Non-Medical): Not on file  Physical Activity:   . Days of Exercise per Week: Not on file  . Minutes of Exercise per Session: Not on file  Stress:   . Feeling of Stress : Not on file  Social Connections:   . Frequency of Communication with Friends and Family: Not on file  . Frequency of Social Gatherings with Friends and Family: Not on file  . Attends Religious Services: Not on file  . Active Member of Clubs or Organizations: Not on file  . Attends Meetings: Not  on file  . Marital Status: Not on file    Allergies:  Not on File  Objective:    Vital Signs:   Temp:  [98 F (36.7 C)-98.3 F (36.8 C)] 98 F (36.7 C) (11/29 1100) Pulse Rate:  [55-108] 103 (11/29 1500) Resp:  [16-32] 27 (11/29 1500) BP: (138-190)/(91-169) 151/99 (11/29 1500) SpO2:  [96 %-100 %] 99 % (11/29 1500) Weight:  [129.3 kg] 129.3 kg (11/29 0721)    Weight change: Filed Weights   10/14/20 0721  Weight: 129.3 kg    Intake/Output:   Intake/Output Summary (Last 24 hours) at 10/14/2020 1536 Last data filed at 10/14/2020 1300 Gross per 24 hour  Intake 540 ml  Output 800 ml  Net -260 ml      Physical Exam    General:  Well appearing, moderately obese. No resp difficulty HEENT: normal Neck: supple. JVP elevated to ear . Carotids 2+ bilat; no bruits. No lymphadenopathy or thyromegaly appreciated. Cor: PMI nondisplaced. Regular rate & rhythm. No rubs, gallops or murmurs. Lungs: decreased BS at the bases bilaterally  Abdomen: obese, mildly distended, nontender. No hepatosplenomegaly. No bruits or masses. Good bowel sounds. Extremities: no cyanosis, clubbing, rash, 2+ bilateral LEE, L>R  Neuro: alert & orientedx3, cranial nerves grossly intact. moves all 4 extremities w/o difficulty. Affect pleasant   Telemetry   NSR- sinus tach 90s-low 100s,   EKG    sinus tach 112 bpm + PACs, ? Remote inferior/ anterior infarcts.  Labs   Basic Metabolic Panel: Recent Labs  Lab 10/14/20 0732  NA 135  K 4.2  CL 97*  CO2 27  GLUCOSE 110*  BUN 18  CREATININE 1.46*  CALCIUM 9.0    Liver Function Tests: Recent Labs  Lab 10/14/20 0834  AST 28  ALT 34  ALKPHOS 61  BILITOT 1.0  PROT 7.7  ALBUMIN 3.1*   No results for input(s): LIPASE, AMYLASE in the last 168 hours. No results for input(s): AMMONIA in the last 168 hours.  CBC: Recent Labs  Lab 10/14/20 0732 10/14/20 0834  WBC 5.2 5.1  NEUTROABS  --  2.6  HGB 11.6* 12.2*  HCT 37.6* 39.8  MCV 77.4*  77.1*  PLT 324 314    Cardiac Enzymes: No results for input(s): CKTOTAL, CKMB, CKMBINDEX, TROPONINI in the last 168 hours.  BNP: BNP (last 3 results) Recent Labs    10/14/20 0834  BNP 2,938.5*    ProBNP (last 3 results) No results for input(s): PROBNP in the last 8760 hours.   CBG: No results for input(s): GLUCAP in the last 168 hours.  Coagulation Studies: No results for input(s): LABPROT, INR in the last 72 hours.   Imaging   DG Chest 2 View  Result Date: 10/14/2020 CLINICAL DATA:  Shortness of breath EXAM: CHEST - 2 VIEW COMPARISON:  None. FINDINGS: Pulmonary vascular congestion. Enlargement of the main pulmonary artery suggesting pulmonary arterial hypertension. Bilateral hilar prominence is likely related to this. No pleural effusion or pneumothorax. Cardiomegaly. No acute osseous abnormality IMPRESSION: Pulmonary vascular congestion and cardiomegaly. Likely pulmonary arterial hypertension. Electronically Signed   By: Guadlupe Spanish M.D.   On: 10/14/2020 07:58   ECHOCARDIOGRAM COMPLETE  Result Date: 10/14/2020    ECHOCARDIOGRAM REPORT   Patient Name:   Paul [name] Date of Exam: 10/14/2020 Medical Rec #:  [id number]     Height:       75.0 in Accession #:    [id number]    Weight:       285.0 lb Date of Birth:  04-08-1961     BSA:          2.551 m Patient Age:    59 years      BP:           152/132 mmHg Patient Gender: M             HR:           76 bpm. Exam Location:  Inpatient Procedure: 2D Echo, 3D Echo, Color Doppler, Cardiac Doppler and Intracardiac            Opacification Agent STAT ECHO  Results reported to Dr Nedra Hai and Dr Mayford Knife at 11:37.  Indications:    I50.21 Acute systolic (congestive) heart failure  History:        Patient has no prior history of Echocardiogram examinations.                 Risk Factors:Hypertension.  Sonographer:    Irving Burton Senior RDCS Referring Phys: [id number] ERIN SCHLOSSMAN IMPRESSIONS  1. Left ventricular ejection fraction, by estimation, is 20  to 25%. The left ventricle has severely decreased function. The left ventricle demonstrates global hypokinesis. The left ventricular internal cavity size was mildly dilated. There is mild left ventricular hypertrophy. Left ventricular diastolic parameters are indeterminate.  2. LV apical thrombus measuring 2.4 cm x 1.3 cm  3. Right ventricular systolic function is moderately reduced. The right ventricular size is mildly enlarged. Tricuspid regurgitation signal is inadequate for assessing PA pressure.  4. Left atrial size was moderately dilated.  5. Right atrial size was moderately dilated.  6. The mitral valve is normal in structure. Mild mitral valve regurgitation.  7. The aortic valve is tricuspid. Aortic valve regurgitation is not visualized. No aortic stenosis is present.  8. Aortic dilatation noted. There is mild dilatation of the ascending aorta, measuring 37 mm.  9. The inferior vena cava is dilated in size with <50% respiratory variability, suggesting right atrial pressure of 15 mmHg. FINDINGS  Left Ventricle: Left ventricular ejection fraction, by estimation, is 20 to 25%. The left ventricle has severely decreased function. The left ventricle demonstrates global hypokinesis. Definity contrast agent was given IV to delineate the left ventricular endocardial borders. The left ventricular internal cavity size was mildly dilated. There is mild left ventricular hypertrophy. Left ventricular diastolic parameters are indeterminate. Right Ventricle: The right ventricular size is mildly enlarged. Right vetricular wall thickness was not assessed. Right ventricular systolic function is moderately reduced. Tricuspid regurgitation signal is inadequate for assessing PA pressure. Left Atrium: Left atrial size was moderately dilated. Right Atrium: Right atrial size was moderately dilated. Pericardium: There is no evidence of pericardial effusion. Mitral Valve: The mitral valve is normal in structure. Mild mitral valve  regurgitation. Tricuspid Valve: The tricuspid valve is normal in structure. Tricuspid valve regurgitation is trivial. Aortic Valve: The aortic valve is tricuspid. Aortic valve regurgitation is not visualized. No aortic stenosis is present. Pulmonic Valve: The pulmonic valve was not well visualized. Pulmonic valve regurgitation is not visualized. Aorta: The aortic root is normal in size and structure and aortic dilatation noted. There is mild dilatation of the ascending aorta, measuring 37 mm. Venous: The inferior vena cava is dilated in size with less than 50% respiratory variability, suggesting right atrial pressure of 15 mmHg. IAS/Shunts: The interatrial septum was not well visualized.  LEFT VENTRICLE PLAX 2D LVIDd:         5.90 cm LVIDs:         5.70 cm LV PW:         1.30 cm LV IVS:        1.10 cm LVOT diam:     2.10 cm      3D Volume EF: LV SV:         29           3D EF:        24 % LV SV Index:   11           LV EDV:       344 ml LVOT Area:     3.46 cm     LV ESV:  263 ml                             LV SV:        82 ml  LV Volumes (MOD) LV vol d, MOD A2C: 182.0 ml LV vol d, MOD A4C: 196.0 ml LV vol s, MOD A2C: 141.0 ml LV vol s, MOD A4C: 133.0 ml LV SV MOD A2C:     41.0 ml LV SV MOD A4C:     196.0 ml LV SV MOD BP:      48.7 ml RIGHT VENTRICLE RV S prime:     7.07 cm/s TAPSE (M-mode): 0.9 cm LEFT ATRIUM              Index       RIGHT ATRIUM           Index LA diam:        5.60 cm  2.20 cm/m  RA Area:     29.50 cm LA Vol (A2C):   141.0 ml 55.28 ml/m RA Volume:   99.50 ml  39.01 ml/m LA Vol (A4C):   86.6 ml  33.95 ml/m LA Biplane Vol: 116.0 ml 45.48 ml/m  AORTIC VALVE LVOT Vmax:   55.90 cm/s LVOT Vmean:  39.667 cm/s LVOT VTI:    0.085 m  AORTA Ao Root diam: 3.80 cm Ao Asc diam:  3.70 cm  SHUNTS Systemic VTI:  0.08 m Systemic Diam: 2.10 cm Epifanio Lesches MD Electronically signed by Epifanio Lesches MD Signature Date/Time: 10/14/2020/11:38:11 AM    Final      Medications:     Current  Medications: . folic acid  1 mg Oral Daily  . furosemide  40 mg Intravenous TID  . multivitamin with minerals  1 tablet Oral Daily  . sodium chloride flush  3 mL Intravenous Q12H  . thiamine  100 mg Oral Daily   Or  . thiamine  100 mg Intravenous Daily    Infusions: . sodium chloride    . heparin 1,800 Units/hr (10/14/20 1429)     Assessment/Plan   1. Acute Systolic Heart Failure - admitted w/ NYHA Class III symptoms + volume overload. BNP 2,900. CXR w/ cardiomegaly + pulmonary edema. HIV NR. TSH normal. Markedly hypertensive in ED. HS trop 113>>93. EKG sinus tach w/ PACs - Echo shows BiVentricular dysfunction. LVEF 20-25% w/ global HK, mild LVH and moderately reduced RV (no prior study for comparison)  - Admit for IV diuresis, 40 mg bid. Follow BMP  - Add unna boots  - needs BP control, add losartan 25 mg. If renal function remains stable, plan to transition to [employer] 12.5 mg daily  - hold  blocker initiation until euvolemic  - eventual addition of SGLT2i +/- Bidil  - plan R/LHC after diuresis  - cMRI if cath unrevealing  - concern for underlying OSA based on history of snoring, apenic spells during sleep, daytime fatigue and HTN. Will need outpatient sleep study  - ETOH may also contribute. Needs to reduce intake   2. LV Thrombus  - seen on echo, measuring 2.4 cm x 1.3 cm. In setting of severe LV dysfunction  - start IV heparin, will need transition to oral anticoagulant after all  invasive procedures are completed.   3. Hypertension  - was diagnosed w/ HTN remotely but not on any medications PTA - markedly elevated in the ED 190/169 - diuresis per above + addition of  losartan and spiro - follow BP closely  - plan outpatient sleep study per above  4. Renal Insuffiencey - Scr 1.46 on admit - unsure if acute or chronic, baseline SCr unknown - follow BMP closely w/ diuresis   5. ETOH Abuse   - consumes 2-3 drinks of liquor/ day  -  CIWA protocol per  primary team   6. Elevated Troponin  - Hs trop 113>>93, denies ischemic CP - trend not c/w ACS. Suspect likely 2/2 demand ischemia from acute CHF + HTN. However given LV dysfunction and FH, will need eventual LHC to exclude underlying coronary disease     Length of Stay: 0  Robbie Lis, PA-C  10/14/2020, 3:36 PM  Advanced Heart Failure Team Pager [phone] (M-F; 7a - 4p)  Please contact [facility] for night-coverage after hours (4p -7a ) and weekends on [email or web address]   Patient seen and examined with the above-signed Advanced Practice Provider and/or Housestaff. I personally reviewed laboratory data, imaging studies and relevant notes. I independently examined the patient and formulated the important aspects of the plan. I have edited the note to reflect any of my changes or salient points. I have personally discussed the plan with the patient and/or family.  59 y/o male with poorly controlled HTN. CKD 3a, probable OSA and ETOH use admitted with several month h/o progressive HF symptoms.   On arrival BP markedly elevated with evidence of significant volume overload  Echo EF 20-25% with LV thrombus. Moderate RV dysfunction .  On exam General: Sitting up in bed  No resp difficulty HEENT: normal Neck: supple. JVP to ear Carotids 2+ bilat; no bruits. No lymphadenopathy or thryomegaly appreciated. Cor: PMI nondisplaced. Regular tachy + s3 Lungs: mild crackles  Abdomen: obese soft, nontender, + distended. No hepatosplenomegaly. No bruits or masses. Good bowel sounds. Extremities: no cyanosis, clubbing, rash, 3+ edema Neuro: alert & orientedx3, cranial nerves grossly intact. moves all 4 extremities w/o difficulty. Affect pleasant  He has severe biventricular HF with marked overload. 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 OSA, ETOH and PVCs may also be contributing.  Plan - IV diuresis and BP control - start spiro, Entresto and dig.  No b-blocker yet - follow electrolytes and renal function closely - continue heparin for LV thrombus - place compression hose - will need R/L cath once diuresed (probablt Thursday) - quantify PVCs on tele - will need outpatient sleep study  Arvilla Meres, MD  6:46 PM

## 2020-10-14 NOTE — ED Triage Notes (Signed)
Pt reports worsening sob for the past month, denies chest pain. Hx of HTN but states he manages it with home remedies. No other history. RR labored in triage. No wheezing noted. Pt has noticed increased swelling in both his legs at times. Pt a.o, 94% on room air. Pt is fully vaccinated for COVID.

## 2020-10-14 NOTE — ED Notes (Signed)
Attempted to call report, RN will call back.

## 2020-10-14 NOTE — ED Notes (Signed)
Heart Failure team at bedside.

## 2020-10-14 NOTE — ED Notes (Signed)
Lunch Tray Ordered @ 1027. °

## 2020-10-14 NOTE — Progress Notes (Signed)
ANTICOAGULATION CONSULT NOTE - Follow Up Consult  Pharmacy Consult for heparin Indication: LV thrombus   Labs: Recent Labs    10/14/20 0732 10/14/20 0834 10/14/20 1133 10/14/20 2219  HGB 11.6* 12.2*  --   --   HCT 37.6* 39.8  --   --   PLT 324 314  --   --   HEPARINUNFRC  --   --   --  0.29*  CREATININE 1.46*  --   --   --   TROPONINIHS  --  113* 93*  --     Assessment: 59yo male subtherapeutic on heparin with initial dosing for LV thrombus, would prefer higher end of goal with acute thrombus; no gtt issues or signs of bleeding per RN.  Goal of Therapy:  Heparin level 0.3-0.7 units/ml   Plan:  Will increase heparin gtt by 10% to 2000 units/hr and check level in 6 hours.    Vernard Gambles, PharmD, BCPS  10/14/2020,10:59 PM

## 2020-10-14 NOTE — H&P (Signed)
Date: 10/14/2020               Patient Name:  Paul [name] MRN: [id number]  DOB: Nov 13, 1961 Age / Sex: 59 y.o., male   PCP: Patient, No Pcp Per         Medical Service: Internal Medicine Teaching Service         Attending Physician: Paul [name]    Paul [name] Pager: [phone]  Paul [name] Pager: [phone]       After Hours (After 5p/  Paul Contact Pager: [phone]  weekends / holidays): Paul Contact Pager: [phone]   Chief Complaint: Shortness of breath, Leg swelling  History of Present Illness:  Paul [name] is a 59 yo M w/ PMH of HTN and DJD s/p bilateral hip replacements presenting to Paul [name] with shortness of breath, cough, and leg swelling. He mentions that he was in his usual state of health until July of this year when he noticed progressively worsening shortness of breath. He mentions he noticed feeling more and more short of breath and requiring prolonged period of rest while fishing. Since then, he mentions also noticing worsening night time cough, 2-3 pillow orthopnea, worsening leg and abdominal swelling. His girlfriend also noticed worsening day time somnolence with frequent night time awakening events after pause in his breathing. Despite progressing severity of these symptoms he did not seek out medical care due to lack of insurance. However, over the last 2 weeks he has noticed significant worsening of his lower extremity edema and began to have more frequent night time cough which has been keeping him and his significant other up all night so he finally came to ED for evaluation.   He mentions significant social stressors with recent death of his father as well as losing his housing and health coverage during the COVID pandemic. He is currently vaccinated.  On review of systems, he denies any chest pain, palpitations, nausea, vomiting, fevers, chills, constipation. Denies any dysuria, urgency or frequency. Denies any  light-headedness, dizziness, blurry vision or headaches. Mentions that he is able to ambulate up flight of stairs and walk around his house without needing to rest.  Meds:  Used to be on hydrochlorothiazide many years ago but currently takes no chronic medications.  Allergies: Allergies as of 10/14/2020  . (Not on File)   No past medical history on file.  Family History:  Both father and sister had heart failure around 63-60s. Father passed away last year from cardiac complications.  Social History:  Used to work as Advertising account planner. Currently lives with friend, Paul [name]. Denies any tobacco, illicit substance use. Does drink about 2-3x 3oz of hard liquor daily. Denies any hx of withdrawals. Mentions eating lot of bacon, sausage, eggs.  Review of Systems: A complete ROS was negative except as per HPI.   Physical Exam: Blood pressure (!) 171/138, pulse (!) 107, temperature 98.3 F (36.8 C), temperature source Oral, resp. rate (!) 22, height 6\' 3"  (1.905 m), weight 129.3 kg, SpO2 99 %.  Gen: Well-developed, well nourished, NAD HEENT: NCAT head, hearing intact, EOMI, MMM Neck: supple, ROM intact, + JVD CV: Tachycardic, regular rhythm, S1, S2 normal, No rubs, no murmurs, no gallops Pulm: Bibasilar rales Abd: Soft, BS+, Non-tender, Distended with abdominal wall edema Extm: ROM intact, Peripheral pulses intact, 2+ pitting edema up to upper thigh Skin: Dry, Warm, normal turgor Neuro: AAOx3  EKG: personally reviewed my interpretation is sinus tachycardia, + motion artifact, no  ST changes, no prior EKG to compare  CXR: personally reviewed my interpretation is + cardiomegaly, + pulmonary edema, no lobar opacity, no pleural effusion  Assessment & Plan by Problem: Active Problems:   * No active hospital problems. *  Paul [name] is a 59 yo M w/ PMH of HTN and DJD s/p bilateral hip replacements presenting to Paul [name] with shortness of breath, cough, and leg swelling likely due to acute on  chronic systolic vs diastolic heart failure.  Acute congestive heart failure (systolic vs diastolic) Present w/ progreissive dyspnea, swelling, orthopnea. Significantly hypervolemic on exam with BNP @ 2900 and pulmonary edema on X-ray. Concerning for new diagnosis of heart failure. Likely etiology multi-factorial: untreated hypertension, STOP-BANG of 7 for High risk of OSA, Hx of significant alcohol use, family hx of cardiac disease, obesity. Need further work-up as well as admission for inpatient diuresis. Unknown dry weight. - Echocardiogram - Check TSH, Lipid panel, a1c - Start IV furosemide 40mg  BID - Trend BMP, Mag level - Strict I&Os - Daily Weights - Fluid restriction - Keep O2 sat >88 - Replenish K as needed >4.0  Acute Kidney Injury Admit BUN 18, Creatinine 1.46. No prior record for baseline. Significant hypervolemia on exam. Likely due to CHF exacerbation. Should improve with diuresis but may have pre-existing renal dz. Denies hesistancy, urgency, dribbling - UA - Trend renal function - Avoid nephrotoxic meds when able  Hypertension Admit bp 171/138. Previously on hctz 12.5mg  - Should improve with diuresis - Can start losartan vs entresto depending on Echo results  Alcohol Use Drink 2-3 standard drinks of liquor daily. Denies prior hx of withdrawals - CIWA w/o Ativan - Folate, thiamine  DVT prophx: Lovenox Diet: Low salt Bowel: Miralax Code: Full  Prior to Admission Living Arrangement: Home Anticipated Discharge Location: Home Barriers to Discharge: Medical tx  Dispo: Admit patient to Inpatient with expected length of stay greater than 2 midnights.  Signed: , [name] 10/14/2020, 10:44 AM Pager: [phone] After 5pm on weekdays and 1pm on weekends: On Call Pager: [phone]

## 2020-10-14 NOTE — ED Notes (Signed)
Dinner Tray Ordered @ 1716. 

## 2020-10-15 ENCOUNTER — Encounter (HOSPITAL_COMMUNITY): Payer: Self-pay | Admitting: Internal Medicine

## 2020-10-15 LAB — CBC
HCT: 37.9 % — ABNORMAL LOW (ref 39.0–52.0)
Hemoglobin: 11.8 g/dL — ABNORMAL LOW (ref 13.0–17.0)
MCH: 23.7 pg — ABNORMAL LOW (ref 26.0–34.0)
MCHC: 31.1 g/dL (ref 30.0–36.0)
MCV: 76.1 fL — ABNORMAL LOW (ref 80.0–100.0)
Platelets: 272 10*3/uL (ref 150–400)
RBC: 4.98 MIL/uL (ref 4.22–5.81)
RDW: 16.4 % — ABNORMAL HIGH (ref 11.5–15.5)
WBC: 5.7 10*3/uL (ref 4.0–10.5)
nRBC: 0 % (ref 0.0–0.2)

## 2020-10-15 LAB — BASIC METABOLIC PANEL
Anion gap: 10 (ref 5–15)
BUN: 15 mg/dL (ref 6–20)
CO2: 29 mmol/L (ref 22–32)
Calcium: 8.5 mg/dL — ABNORMAL LOW (ref 8.9–10.3)
Chloride: 96 mmol/L — ABNORMAL LOW (ref 98–111)
Creatinine, Ser: 1.2 mg/dL (ref 0.61–1.24)
GFR, Estimated: >60 mL/min (ref >60)
Glucose, Bld: 86 mg/dL (ref 70–99)
Potassium: 3.1 mmol/L — ABNORMAL LOW (ref 3.5–5.1)
Sodium: 135 mmol/L (ref 135–145)

## 2020-10-15 LAB — HEPARIN LEVEL (UNFRACTIONATED)
Heparin Unfractionated: 0.24 IU/mL — ABNORMAL LOW (ref 0.30–0.70)
Heparin Unfractionated: 0.59 IU/mL (ref 0.30–0.70)
Heparin Unfractionated: 0.77 IU/mL — ABNORMAL HIGH (ref 0.30–0.70)

## 2020-10-15 LAB — MAGNESIUM: Magnesium: 1.4 mg/dL — ABNORMAL LOW (ref 1.7–2.4)

## 2020-10-15 MED ORDER — SPIRONOLACTONE 12.5 MG HALF TABLET
12.5000 mg | ORAL_TABLET | Freq: Every day | ORAL | Status: DC
  Administered 2020-10-15 – 2020-10-16 (×2): 12.5 mg via ORAL
  Filled 2020-10-15 (×2): qty 1

## 2020-10-15 MED ORDER — MAGNESIUM SULFATE 4 GM/100ML IV SOLN
4.0000 g | Freq: Once | INTRAVENOUS | Status: AC
  Administered 2020-10-15: 4 g via INTRAVENOUS
  Filled 2020-10-15: qty 100

## 2020-10-15 MED ORDER — FUROSEMIDE 10 MG/ML IJ SOLN
40.0000 mg | Freq: Two times a day (BID) | INTRAMUSCULAR | Status: DC
  Administered 2020-10-15 – 2020-10-16 (×3): 40 mg via INTRAVENOUS
  Filled 2020-10-15 (×4): qty 4

## 2020-10-15 MED ORDER — HEPARIN BOLUS VIA INFUSION
2000.0000 [IU] | Freq: Once | INTRAVENOUS | Status: AC
  Administered 2020-10-15: 2000 [IU] via INTRAVENOUS
  Filled 2020-10-15: qty 2000

## 2020-10-15 MED ORDER — POTASSIUM CHLORIDE CRYS ER 20 MEQ PO TBCR
40.0000 meq | EXTENDED_RELEASE_TABLET | ORAL | Status: AC
  Administered 2020-10-15 (×3): 40 meq via ORAL
  Filled 2020-10-15 (×3): qty 2

## 2020-10-15 NOTE — Progress Notes (Signed)
Subjective: Paul [name] is evaluated at bedside this AM.  States feels much better this morning and slept great first time in last 2 months. Dyspnea also much improved today. Also notes he is able to wear his shoes today (previously too swollen). He also endorses ability to lay flat overnight. Patient motivated to work on lifestyle modifications.  Objective:  Vital signs in last 24 hours: Vitals:   10/14/20 2349 10/15/20 0209 10/15/20 0444 10/15/20 0505  BP: (!) 133/96  122/70   Pulse: 98 95 78   Resp: 18 20 19    Temp: 98.9 F (37.2 C)  98.3 F (36.8 C)   TempSrc: Oral  Oral   SpO2: 98% 97% 99%   Weight:    135.4 kg  Height:       Physical Exam: General: Well-appearing male. No acute distress CV: Tachycardic, regular rate, rhythm. No m/r/g appreciated. JVD+ Pulm: Clear to auscultation bilaterally. No rales, rhonchi appreciated. MSK: 2+ pitting edema bilaterally to knees  CBC Latest Ref Rng & Units 10/15/2020 10/14/2020 10/14/2020  WBC 4.0 - 10.5 K/uL 5.7 5.1 5.2  Hemoglobin 13.0 - 17.0 g/dL 11.8(L) 12.2(L) 11.6(L)  Hematocrit 39 - 52 % 37.9(L) 39.8 37.6(L)  Platelets 150 - 400 K/uL 272 314 324   BMP Latest Ref Rng & Units 10/15/2020 10/14/2020  Glucose 70 - 99 mg/dL 86 10/16/2020)  BUN 6 - 20 mg/dL 15 18  Creatinine 865(H - 1.24 mg/dL 8.46 9.62)  Sodium 9.52(W - 145 mmol/L 135 135  Potassium 3.5 - 5.1 mmol/L 3.1(L) 4.2  Chloride 98 - 111 mmol/L 96(L) 97(L)  CO2 22 - 32 mmol/L 29 27  Calcium 8.9 - 10.3 mg/dL 413) 9.0   Assessment/Plan: Paul [name] is 59yo male (he/him) with hypertension and DJD s/p bilateral hip replacements admitted 11/28 for newly-diagnosed acute systolic heart failure, now symptomatically improved after initial diuresis.  Active Problems:   Acute heart failure (HCC)  #Acute systolic heart failure #Left ventricular apical thrombus Patient this morning endorsies feeling much improved. States he actually slept well and has been able to breathe today. Denies  chest pain this morning as well. TTE yesterday revealed 20-25% EF, LV global hypokinesis, LV apical thrombus, dilated RV, LA, RA. Mild MR. Heart Failure team following, appreciate assistance in this patient's care. Multiple possible etiologies for heart failure, including hypertension, OSA, alcohol. A1c 6.0, lipid panel unremarkable, TSH normal. Plan for initial diuresis then cath for ischemic work-up per HF team. C/w heparin for now, further [facility] after invasive procedures. - IV diuresis per HF team - C/w Entresto, spironolactone, digoxin - Holding beta blocker in setting of acute exacerbation - R/L HC s/p diuresis - IV heparin until invasive procedures are complete  #Hypertension On arrival, systolic blood pressure 190. BP much improved this AM, 122/70 after diuresis, entresto. Will be important to have close control of BP moving forward. Continue with diuresis per HF team and ARB. - C/w Entresto, spiro  #Acute kidney injury sCr 1.46 > 1.20 s/p diuresis. UOP >4L. Unclear on patient's baseline, no prior records. Will continue to trend RFP, especially during diuresis. - Trend RFP - Strict I&O's - Avoid nephrotoxic medications  #Hypokalemia #Hypomagnesia  This AM, K 3.1, Mg 1.4, most likely due to IV lasix. Repleting this AM, will continue to monitor.  - Mg 4g - K [facility] - F/u Mg, BMP tomorrow AM  #Microcytic anemia Hgb 11.8, MCV 76, RDW 16 on CBC. Unclear if this is new or chronic. Will follow-up with iron studies. -  F/u iron studies  DIET: HH IVF: none DVT PPX: heparin BOWEL: Miralax CODE: FULL FAM COM:  Prior to Admission Living Arrangement: Home Anticipated Discharge Location: Home Barriers to Discharge: medical management Dispo: Anticipated discharge in approximately 3-4 day(s).   Evlyn Kanner, MD 10/15/2020, 10:12 AM Pager: [phone] After 5pm on weekdays and 1pm on weekends: On Call pager [phone]

## 2020-10-15 NOTE — Progress Notes (Signed)
OT Cancellation Note  Patient Details Name: Paul [name] MRN: [id number] DOB: 09-05-1961   Cancelled Treatment:    Reason Eval/Treat Not Completed: OT screened, no needs identified, will sign off. Per chart review and conversations with RN and pt, pt is tolerating activity much better today, at baseline with ADLs. Walked 200' x2 independently during PT session. Will sign off.   Raynald Kemp, OT Acute Rehabilitation Services Pager: [phone] Office: [phone]  10/15/2020, 11:02 AM

## 2020-10-15 NOTE — Progress Notes (Addendum)
Advanced Heart Failure Rounding Note  PCP-Cardiologist: No primary care provider on file.   Subjective:    Brisk diuresis w/ IV Lasix. - 7.5L out.  Scr improved w/ diuresis, down from 1.46>>1.20. K 3.1. Mg 1.4   BP better this morning 122/70.   Feels much better. Slept well last night w/o orthopnea, PND.   Remains fluid overloaded.    Objective:   Weight Range: 135.4 kg Body mass index is 37.31 kg/m.   Vital Signs:   Temp:  [98.3 F (36.8 C)-99.2 F (37.3 C)] 98.3 F (36.8 C) (11/30 0444) Pulse Rate:  [78-108] 78 (11/30 0444) Resp:  [16-32] 19 (11/30 0444) BP: (122-190)/(70-169) 122/70 (11/30 0444) SpO2:  [94 %-100 %] 99 % (11/30 0444) Weight:  [135.4 kg] 135.4 kg (11/30 0505)    Weight change: Filed Weights   10/14/20 0721 10/15/20 0505  Weight: 129.3 kg 135.4 kg    Intake/Output:   Intake/Output Summary (Last 24 hours) at 10/15/2020 1152 Last data filed at 10/15/2020 1136 Gross per 24 hour  Intake 1269.82 ml  Output 9000 ml  Net -7730.18 ml      Physical Exam    General:  Well appearing, moderately obese. No resp difficulty HEENT: Normal Neck: Supple. JVP elevated to jaw . Carotids 2+ bilat; no bruits. No lymphadenopathy or thyromegaly appreciated. Cor: PMI nondisplaced. Regular rate & rhythm. No rubs, gallops or murmurs. Lungs: decreased BS at the bases bilaterally  Abdomen: obese and edematous, nontender, nondistended. No hepatosplenomegaly. No bruits or masses. Good bowel sounds. Extremities: No cyanosis, clubbing, rash, 2+ bilateral LEE edema Neuro: Alert & orientedx3, cranial nerves grossly intact. moves all 4 extremities w/o difficulty. Affect pleasant   Telemetry   NSR 90s   EKG    No new EKG to review   Labs    CBC Recent Labs    10/14/20 0834 10/15/20 0736  WBC 5.1 5.7  NEUTROABS 2.6  --   HGB 12.2* 11.8*  HCT 39.8 37.9*  MCV 77.1* 76.1*  PLT 314 272   Basic Metabolic Panel Recent Labs    54/00/86 0732  10/15/20 0736  NA 135 135  K 4.2 3.1*  CL 97* 96*  CO2 27 29  GLUCOSE 110* 86  BUN 18 15  CREATININE 1.46* 1.20  CALCIUM 9.0 8.5*  MG  --  1.4*   Liver Function Tests Recent Labs    10/14/20 0834  AST 28  ALT 34  ALKPHOS 61  BILITOT 1.0  PROT 7.7  ALBUMIN 3.1*   No results for input(s): LIPASE, AMYLASE in the last 72 hours. Cardiac Enzymes No results for input(s): CKTOTAL, CKMB, CKMBINDEX, TROPONINI in the last 72 hours.  BNP: BNP (last 3 results) Recent Labs    10/14/20 0834  BNP 2,938.5*    ProBNP (last 3 results) No results for input(s): PROBNP in the last 8760 hours.   D-Dimer No results for input(s): DDIMER in the last 72 hours. Hemoglobin A1C Recent Labs    10/14/20 1133  HGBA1C 6.0*   Fasting Lipid Panel Recent Labs    10/14/20 1133  CHOL 97  HDL 31*  LDLCALC 56  TRIG 48  CHOLHDL 3.1   Thyroid Function Tests Recent Labs    10/14/20 1133  TSH 1.243    Other results:   Imaging     No results found.   Medications:     Scheduled Medications: . digoxin  0.125 mg Oral Daily  . folic acid  1 mg Oral  Daily  . furosemide  40 mg Intravenous TID  . multivitamin with minerals  1 tablet Oral Daily  . potassium chloride  40 mEq Oral Q2H  . sacubitril-valsartan  1 tablet Oral BID  . sodium chloride flush  3 mL Intravenous Q12H  . thiamine  100 mg Oral Daily   Or  . thiamine  100 mg Intravenous Daily     Infusions: . sodium chloride    . heparin 2,200 Units/hr (10/15/20 1054)  . magnesium sulfate bolus IVPB 4 g (10/15/20 1133)     PRN Medications:  sodium chloride, acetaminophen, ondansetron (ZOFRAN) IV, polyethylene glycol, sodium chloride flush   Assessment/Plan   1. Acute Systolic Heart Failure - admitted w/ NYHA Class III symptoms + volume overload. BNP 2,900. CXR w/ cardiomegaly + pulmonary edema. HIV NR. TSH normal. Markedly hypertensive in ED. HS trop 113>>93. EKG sinus tach w/ PACs - Echo shows BiVentricular  dysfunction. LVEF 20-25% w/ global HK, mild LVH and moderately reduced RV (no prior study for comparison)  - responding well to IV diuresis but remains fluid overloaded - Continue IV Lasix, reduce to 40 mg bid  - Add TED Hoses   - Continue Entresto 49-51 mg bid  - Start Spiro 12.5 mg daily  - hold ? blocker initiation until euvolemic  - continue digoxin 0.125 mg  - eventual addition of SGLT2i +/- Bidil  - plan R/LHC after diuresis (likely Thursday)  - cMRI if cath unrevealing  - concern for underlying OSA based on history of snoring, apenic spells during sleep, daytime fatigue and HTN. Will need outpatient sleep study  - ETOH may also contribute. Needs to reduce intake   2. LV Thrombus  - seen on echo, measuring 2.4 cm x 1.3 cm. In setting of severe LV dysfunction  - Continue IV heparin, will need transition to oral anticoagulant after all  invasive procedures are completed.   3. Hypertension  - was diagnosed w/ HTN remotely but not on any medications PTA - markedly elevated in the ED 190/169 - improved today w/ diuresis and Entresto  - cont diuresis per above + entresto, Add spiro today  - follow BP closely  - plan outpatient sleep study per above  4. Renal Insuffiencey - Scr 1.46 on admit - unsure if acute or chronic, baseline SCr unknown - improving w/ diuresis, 1.20 today  - follow BMP closely     5. ETOH Abuse   - consumes 2-3 drinks of liquor/ day  -  CIWA protocol per primary team   6. Elevated Troponin  - Hs trop 113>>93, denies ischemic CP - trend not c/w ACS. Suspect likely 2/2 demand ischemia from acute CHF + HTN. However given LV dysfunction and FH, will need eventual LHC to exclude underlying coronary disease   7. Hypokalemia - K 3.1 today w/ diuresis - supp w/ KCl - Add Spiro 12.5  - f/u BMP   Length of Stay: 1  Brittainy Simmons, PA-C  10/15/2020, 11:52 AM  Advanced Heart Failure Team Pager [phone] (M-F; 7a - 4p)  Please contact [facility] for night-coverage after hours (4p -7a ) and weekends on [email or web address]  Patient seen and examined with the above-signed Advanced Practice Provider and/or Housestaff. I personally reviewed laboratory data, imaging studies and relevant notes. I independently examined the patient and formulated the important aspects of the plan. I have edited the note to reflect any of my changes or salient points. I have personally discussed the plan with the patient  and/or family.  Diuresing well on IV lasix. Breathing much better. Still still volume overloaded. Renal function stable. On heparin for LV thrombus. No bleeding.  General:  Sitting up in bed. No resp difficulty HEENT: normal Neck: supple. JVP to jaw  Carotids 2+ bilat; no bruits. No lymphadenopathy or thryomegaly appreciated. Cor: PMI nondisplaced. Regular tachy + s3 Lungs: clear Abdomen: soft, nontender, nondistended. No hepatosplenomegaly. No bruits or masses. Good bowel sounds. Extremities: no cyanosis, clubbing, rash, 2+ edema Neuro: alert & orientedx3, cranial nerves grossly intact. moves all 4 extremities w/o difficulty. Affect pleasant  Continue IV diuresis. R/L cath when full diuresed. Watch renal function. Supp lytes Continue heparin .Discussed dosing with PharmD personally.  Arvilla Meres, MD  4:59 PM

## 2020-10-15 NOTE — Evaluation (Signed)
Physical Therapy Evaluation Patient Details Name: Paul [name] MRN: [id number] DOB: Sep 14, 1961 Today's Date: 10/15/2020   History of Present Illness  Paul [name] is a 59 yo M w/ PMH of HTN and DJD s/p bilateral hip replacements admitted with shortness of breath, cough, and leg swelling likely due to acute on chronic systolic vs diastolic heart failure.  Also found to be hypertensive with AKI.  Clinical Impression  Patient presents with mobility reportedly better than his baseline due to decreased SOB after diuresis.  Patient with low risk for falls per DGI (scored 23/24 with scores less than 19 demonstrating fall risk.)  Did educate about fall prevention as will be maintained on anticoagulation.  Patient with no further skilled PT needs.  PT to sign off.     Follow Up Recommendations No PT follow up    Equipment Recommendations  None recommended by PT    Recommendations for Other Services       Precautions / Restrictions Precautions Precautions: None      Mobility  Bed Mobility Overal bed mobility: Independent                  Transfers Overall transfer level: Independent                  Ambulation/Gait Ambulation/Gait assistance: Independent Gait Distance (Feet): 200 Feet (x 2) Assistive device: None Gait Pattern/deviations: WFL(Within Functional Limits)        Stairs Stairs: Yes Stairs assistance: Modified independent (Device/Increase time) Stair Management: One rail Paul [name];Alternating pattern;Forwards Number of Stairs: 4    Wheelchair Mobility    Modified Rankin (Stroke Patients Only)       Balance                                 Standardized Balance Assessment Standardized Balance Assessment : Dynamic Gait Index   Dynamic Gait Index Level Surface: Normal Change in Gait Speed: Normal Gait with Horizontal Head Turns: Normal Gait with Vertical Head Turns: Normal Gait and Pivot Turn: Normal Step Over Obstacle: Normal Step  Around Obstacles: Normal Steps: Mild Impairment Total Score: 23       Pertinent Vitals/Pain Pain Assessment: No/denies pain    Home Living Family/patient expects to be discharged to:: Private residence Living Arrangements: Spouse/significant other Available Help at Discharge: Friend(s) Type of Home: House Home Access: Stairs to enter Entrance Stairs-Rails: None Paul [name] of Steps: 4 Home Layout: One level Home Equipment: Grab bars - tub/shower Additional Comments: Paul [name] has shower seat and cane    Prior Function Level of Independence: Independent         Comments: driving prior to admission, not working since [employer], owned an Equities trader Paul [name]   Paul [name]: Paul [name]    Extremity/Trunk Assessment   Upper Extremity Assessment Upper Extremity Assessment: Overall WFL for tasks assessed    Lower Extremity Assessment Lower Extremity Assessment: Overall WFL for tasks assessed    Cervical / Trunk Assessment Cervical / Trunk Assessment: Normal  Communication   Communication: No difficulties  Cognition Arousal/Alertness: Awake/alert Behavior During Therapy: WFL for tasks assessed/performed Overall Cognitive Status: Within Functional Limits for tasks assessed                                        General Comments General comments (skin integrity, edema,  etc.): HR up to 124 with ambulation, mild SOB, but still able to talk while walking, noted diaphoresis with ambulation, but pt denies symptoms.  Education on fall prevention given due to pt will be maintained on anticoagulation.    Exercises     Assessment/Plan    PT Assessment Patent does not need any further PT services  PT Problem List         PT Treatment Interventions      PT Goals (Current goals can be found in the Care Plan section)  Acute Rehab PT Goals Patient Stated Goal: to be able to walk without SOB PT Goal Formulation: All assessment and education  complete, DC therapy    Frequency     Barriers to discharge        Co-evaluation               AM-PAC PT "6 Clicks" Mobility  Outcome Measure Help needed turning from your back to your side while in a flat bed without using bedrails?: None Help needed moving from lying on your back to sitting on the side of a flat bed without using bedrails?: None Help needed moving to and from a bed to a chair (including a wheelchair)?: None Help needed standing up from a chair using your arms (e.g., wheelchair or bedside chair)?: None Help needed to walk in hospital room?: None Help needed climbing 3-5 steps with a railing? : None 6 Click Score: 24    End of Session   Activity Tolerance: Patient tolerated treatment well Patient left: in bed;with call bell/phone within reach   PT Visit Diagnosis: Difficulty in walking, not elsewhere classified (R26.2)    Time: 1610-9604 PT Time Calculation (min) (ACUTE ONLY): 32 min   Charges:   PT Evaluation $PT Eval Low Complexity: 1 Low PT Treatments $Self Care/Home Management: 8-22        Sheran Lawless, PT Acute Rehabilitation Services Pager:[phone] Office:[phone] 10/15/2020   Elray Mcgregor 10/15/2020, 10:08 AM

## 2020-10-15 NOTE — Progress Notes (Signed)
Placed patient on CPAP per order via nasal mask (pt does not wear at home) 10.0 cm H20. Tolerating well at this time. RN aware.

## 2020-10-15 NOTE — Progress Notes (Signed)
ANTICOAGULATION CONSULT NOTE  Pharmacy Consult for Heparin Indication: LV Thrombus   Allergies  Allergen Reactions  . Shellfish Allergy Shortness Of Breath and Swelling    Throat swelling    Patient Measurements: Height: 6\' 3"  (190.5 cm) Weight: 135.4 kg (298 lb 8 oz) IBW/kg (Calculated) : 84.5 Heparin Dosing Weight: 112.7 kg  Vital Signs: Temp: 98.3 F (36.8 C) (11/30 0444) Temp Source: Oral (11/30 0444) BP: 122/70 (11/30 0444) Pulse Rate: 78 (11/30 0444)  Labs: Recent Labs    10/14/20 0732 10/14/20 0732 10/14/20 0834 10/14/20 1133 10/14/20 2219 10/15/20 0736  HGB 11.6*   < > 12.2*  --   --  11.8*  HCT 37.6*  --  39.8  --   --  37.9*  PLT 324  --  314  --   --  272  HEPARINUNFRC  --   --   --   --  0.29* 0.24*  CREATININE 1.46*  --   --   --   --  1.20  TROPONINIHS  --   --  113* 93*  --   --    < > = values in this interval not displayed.    Estimated Creatinine Clearance: 97.1 mL/min (by C-G formula based on SCr of 1.2 mg/dL).   Medical History: Past Medical History:  Diagnosis Date  . History of hip replacement   . Hypertension     Medications:  Scheduled:  . digoxin  0.125 mg Oral Daily  . folic acid  1 mg Oral Daily  . furosemide  40 mg Intravenous TID  . heparin  2,000 Units Intravenous Once  . multivitamin with minerals  1 tablet Oral Daily  . potassium chloride  40 mEq Oral Q2H  . sacubitril-valsartan  1 tablet Oral BID  . sodium chloride flush  3 mL Intravenous Q12H  . thiamine  100 mg Oral Daily   Or  . thiamine  100 mg Intravenous Daily    Assessment: Patient is a 59 yom that is being admitted for AHF, and an LV thrombus, pt started on IV heparin. Heparin level subtherapeutic this morning, CBC stable.  Goal of Therapy:  Heparin level 0.3-0.7 units/ml Monitor platelets by anticoagulation protocol: Yes   Plan:  -Heparin 2000 unit bolus -Increase heparin to 2200 units/h -Recheck heparin level in 6h   10/17/20, PharmD,  [place], [facility] Clinical Pharmacist [phone] Please check AMION for all [facility] numbers 10/15/2020

## 2020-10-15 NOTE — Progress Notes (Signed)
ANTICOAGULATION CONSULT NOTE  Pharmacy Consult for Heparin Indication: LV Thrombus   Allergies  Allergen Reactions  . Shellfish Allergy Shortness Of Breath and Swelling    Throat swelling    Patient Measurements: Height: 6\' 3"  (190.5 cm) Weight: 135.4 kg (298 lb 8 oz) IBW/kg (Calculated) : 84.5 Heparin Dosing Weight: 112.7 kg  Vital Signs: Temp: 98.1 F (36.7 C) (11/30 1801) Temp Source: Oral (11/30 1801) BP: 130/92 (11/30 1801) Pulse Rate: 102 (11/30 1801)  Labs: Recent Labs    10/14/20 0732 10/14/20 0732 10/14/20 0834 10/14/20 1133 10/14/20 2219 10/15/20 0736 10/15/20 1542 10/15/20 1837  HGB 11.6*   < > 12.2*  --   --  11.8*  --   --   HCT 37.6*  --  39.8  --   --  37.9*  --   --   PLT 324  --  314  --   --  272  --   --   HEPARINUNFRC  --   --   --   --    < > 0.24* 0.59 0.77*  CREATININE 1.46*  --   --   --   --  1.20  --   --   TROPONINIHS  --   --  113* 93*  --   --   --   --    < > = values in this interval not displayed.    Estimated Creatinine Clearance: 97.1 mL/min (by C-G formula based on SCr of 1.2 mg/dL).   Medical History: Past Medical History:  Diagnosis Date  . CHF (congestive heart failure) (HCC)   . Dyspnea   . History of hip replacement   . Hypertension     Medications:  Scheduled:  . digoxin  0.125 mg Oral Daily  . folic acid  1 mg Oral Daily  . furosemide  40 mg Intravenous BID  . multivitamin with minerals  1 tablet Oral Daily  . sacubitril-valsartan  1 tablet Oral BID  . sodium chloride flush  3 mL Intravenous Q12H  . spironolactone  12.5 mg Oral Daily  . thiamine  100 mg Oral Daily   Or  . thiamine  100 mg Intravenous Daily    Assessment: Patient is a 59 yom that is being admitted for AHF, and an LV thrombus, pt started on IV heparin. Heparin level supratherapeutic this evening (0.77), CBC stable.  Goal of Therapy:  Heparin level 0.3-0.7 units/ml Monitor platelets by anticoagulation protocol: Yes   Plan:  -Decrease  heparin to 2100 units/h -Recheck heparin level in AM   Vannah Nadal A. 10/17/20, PharmD, BCPS, FNK[facility] Clinical Pharmacist [place] Please utilize Amion for appropriate phone number to reach the unit pharmacist [facility])   10/15/2020

## 2020-10-16 DIAGNOSIS — R7303 Prediabetes: Secondary | ICD-10-CM

## 2020-10-16 DIAGNOSIS — G4733 Obstructive sleep apnea (adult) (pediatric): Secondary | ICD-10-CM

## 2020-10-16 LAB — CBC
HCT: 37.1 % — ABNORMAL LOW (ref 39.0–52.0)
Hemoglobin: 11.6 g/dL — ABNORMAL LOW (ref 13.0–17.0)
MCH: 23.8 pg — ABNORMAL LOW (ref 26.0–34.0)
MCHC: 31.3 g/dL (ref 30.0–36.0)
MCV: 76.2 fL — ABNORMAL LOW (ref 80.0–100.0)
Platelets: 298 10*3/uL (ref 150–400)
RBC: 4.87 MIL/uL (ref 4.22–5.81)
RDW: 16.3 % — ABNORMAL HIGH (ref 11.5–15.5)
WBC: 5.3 10*3/uL (ref 4.0–10.5)
nRBC: 0 % (ref 0.0–0.2)

## 2020-10-16 LAB — BASIC METABOLIC PANEL
Anion gap: 10 (ref 5–15)
BUN: 13 mg/dL (ref 6–20)
CO2: 33 mmol/L — ABNORMAL HIGH (ref 22–32)
Calcium: 8.7 mg/dL — ABNORMAL LOW (ref 8.9–10.3)
Chloride: 97 mmol/L — ABNORMAL LOW (ref 98–111)
Creatinine, Ser: 1.33 mg/dL — ABNORMAL HIGH (ref 0.61–1.24)
GFR, Estimated: >60 mL/min (ref >60)
Glucose, Bld: 135 mg/dL — ABNORMAL HIGH (ref 70–99)
Potassium: 3.6 mmol/L (ref 3.5–5.1)
Sodium: 140 mmol/L (ref 135–145)

## 2020-10-16 LAB — MAGNESIUM: Magnesium: 1.8 mg/dL (ref 1.7–2.4)

## 2020-10-16 LAB — IRON AND TIBC
Iron: 22 ug/dL — ABNORMAL LOW (ref 45–182)
Saturation Ratios: 6 % — ABNORMAL LOW (ref 17.9–39.5)
TIBC: 392 ug/dL (ref 250–450)
UIBC: 370 ug/dL

## 2020-10-16 LAB — FERRITIN: Ferritin: 25 ng/mL (ref 24–336)

## 2020-10-16 LAB — HEPARIN LEVEL (UNFRACTIONATED): Heparin Unfractionated: 0.58 IU/mL (ref 0.30–0.70)

## 2020-10-16 MED ORDER — POTASSIUM CHLORIDE CRYS ER 20 MEQ PO TBCR
40.0000 meq | EXTENDED_RELEASE_TABLET | ORAL | Status: AC
  Administered 2020-10-16 (×2): 40 meq via ORAL
  Filled 2020-10-16 (×2): qty 2

## 2020-10-16 MED ORDER — SPIRONOLACTONE 12.5 MG HALF TABLET
12.5000 mg | ORAL_TABLET | Freq: Once | ORAL | Status: AC
  Administered 2020-10-16: 12.5 mg via ORAL
  Filled 2020-10-16: qty 1

## 2020-10-16 MED ORDER — SPIRONOLACTONE 25 MG PO TABS
25.0000 mg | ORAL_TABLET | Freq: Every day | ORAL | Status: DC
  Administered 2020-10-17 – 2020-10-22 (×6): 25 mg via ORAL
  Filled 2020-10-16 (×6): qty 1

## 2020-10-16 MED ORDER — SODIUM CHLORIDE 0.9 % IV SOLN
510.0000 mg | Freq: Once | INTRAVENOUS | Status: AC
  Administered 2020-10-16: 510 mg via INTRAVENOUS
  Filled 2020-10-16: qty 17

## 2020-10-16 MED ORDER — SODIUM CHLORIDE 0.9% FLUSH
3.0000 mL | Freq: Two times a day (BID) | INTRAVENOUS | Status: DC
  Administered 2020-10-16 – 2020-10-17 (×2): 3 mL via INTRAVENOUS

## 2020-10-16 MED ORDER — MAGNESIUM SULFATE 2 GM/50ML IV SOLN
2.0000 g | Freq: Once | INTRAVENOUS | Status: AC
  Administered 2020-10-16: 2 g via INTRAVENOUS
  Filled 2020-10-16: qty 50

## 2020-10-16 MED ORDER — ASPIRIN 81 MG PO CHEW
81.0000 mg | CHEWABLE_TABLET | ORAL | Status: AC
  Administered 2020-10-17: 81 mg via ORAL
  Filled 2020-10-16: qty 1

## 2020-10-16 NOTE — Progress Notes (Signed)
ANTICOAGULATION CONSULT NOTE  Pharmacy Consult for Heparin Indication: LV Thrombus   Allergies  Allergen Reactions  . Shellfish Allergy Shortness Of Breath and Swelling    Throat swelling    Patient Measurements: Height: 6\' 3"  (190.5 cm) Weight: 132.1 kg (291 lb 3.2 oz) IBW/kg (Calculated) : 84.5 Heparin Dosing Weight: 112.7 kg  Vital Signs: Temp: 98.7 F (37.1 C) (12/01 0509) Temp Source: Oral (12/01 0509) BP: 160/113 (12/01 0509) Pulse Rate: 104 (12/01 0509)  Labs: Recent Labs     0000 10/14/20 0732 10/14/20 0732 10/14/20 0834 10/14/20 1133 10/14/20 2219 10/15/20 0736 10/15/20 0736 10/15/20 1542 10/15/20 1837 10/16/20 0312  HGB   < > 11.6*   < > 12.2*  --   --  11.8*  --   --   --  11.6*  HCT  --  37.6*   < > 39.8  --   --  37.9*  --   --   --  37.1*  PLT  --  324   < > 314  --   --  272  --   --   --  298  HEPARINUNFRC  --   --   --   --   --    < > 0.24*   < > 0.59 0.77* 0.58  CREATININE  --  1.46*  --   --   --   --  1.20  --   --   --  1.33*  TROPONINIHS  --   --   --  113* 93*  --   --   --   --   --   --    < > = values in this interval not displayed.    Estimated Creatinine Clearance: 86.5 mL/min (A) (by C-G formula based on SCr of 1.33 mg/dL (H)).   Medical History: Past Medical History:  Diagnosis Date  . CHF (congestive heart failure) (HCC)   . Dyspnea   . History of hip replacement   . Hypertension     Medications:  Scheduled:  . digoxin  0.125 mg Oral Daily  . folic acid  1 mg Oral Daily  . furosemide  40 mg Intravenous BID  . multivitamin with minerals  1 tablet Oral Daily  . sacubitril-valsartan  1 tablet Oral BID  . sodium chloride flush  3 mL Intravenous Q12H  . spironolactone  12.5 mg Oral Daily  . thiamine  100 mg Oral Daily   Or  . thiamine  100 mg Intravenous Daily    Assessment: Patient is a 59 yom that is being admitted for AHF, and an LV thrombus, pt started on IV heparin. Heparin level therapeutic, CBC stable.  Goal  of Therapy:  Heparin level 0.3-0.7 units/ml Monitor platelets by anticoagulation protocol: Yes   Plan:  -Continue heparin 2100 units/h -Daily heparin level and CBC   14/01/21, PharmD, BCPS, [facility] Clinical Pharmacist [phone] Please check AMION for all [facility] numbers 10/16/2020

## 2020-10-16 NOTE — H&P (View-Only) (Signed)
Advanced Heart Failure Rounding Note  PCP-Cardiologist: No primary care provider on file.   Subjective:    Continues to diurese well. 5.7L in UOP yesterday. Wt down another 7 lb.   SCr stable at 1.33. K 3.6.   Anemia panel c/w IDA.  Hbg 11.6 Ferritin 25 Iron 22 Sats Ratio 6%   He is feeling better. No resting dyspnea. Denies CP.     Objective:   Weight Range: 132.1 kg Body mass index is 36.4 kg/m.   Vital Signs:   Temp:  [98.1 F (36.7 C)-98.7 F (37.1 C)] 98.7 F (37.1 C) (12/01 0509) Pulse Rate:  [85-105] 104 (12/01 0509) Resp:  [18-20] 18 (12/01 0509) BP: (130-160)/(78-113) 160/113 (12/01 0509) SpO2:  [93 %-97 %] 95 % (12/01 0509) Weight:  [132.1 kg] 132.1 kg (12/01 0500)    Weight change: Filed Weights   10/14/20 0721 10/15/20 0505 10/16/20 0500  Weight: 129.3 kg 135.4 kg 132.1 kg    Intake/Output:   Intake/Output Summary (Last 24 hours) at 10/16/2020 0946 Last data filed at 10/16/2020 0600 Gross per 24 hour  Intake 1765.16 ml  Output 4850 ml  Net -3084.84 ml      Physical Exam    General:  Well appearing, moderately obese. No resp difficulty HEENT: Normal Neck: Supple. JVP ~8 cm . Carotids 2+ bilat; no bruits. No lymphadenopathy or thyromegaly appreciated. Cor: PMI nondisplaced. Regular rate & rhythm. No rubs, gallops or murmurs. Lungs: clear, no wheezing  Abdomen: obese and edematous, nontender, nondistended. No hepatosplenomegaly. No bruits or masses. Good bowel sounds. Extremities: No cyanosis, clubbing, rash, trace- 1+  bilateral LEE  Neuro: Alert & orientedx3, cranial nerves grossly intact. moves all 4 extremities w/o difficulty. Affect pleasant   Telemetry   NSR/ST 90s-low 100s. 5 beats NSVT   EKG    No new EKG to review   Labs    CBC Recent Labs    10/14/20 0834 10/14/20 0834 10/15/20 0736 10/16/20 0312  WBC 5.1   < > 5.7 5.3  NEUTROABS 2.6  --   --   --   HGB 12.2*   < > 11.8* 11.6*  HCT 39.8   < > 37.9* 37.1*   MCV 77.1*   < > 76.1* 76.2*  PLT 314   < > 272 298   < > = values in this interval not displayed.   Basic Metabolic Panel Recent Labs    76/28/31 0736 10/16/20 0312  NA 135 140  K 3.1* 3.6  CL 96* 97*  CO2 29 33*  GLUCOSE 86 135*  BUN 15 13  CREATININE 1.20 1.33*  CALCIUM 8.5* 8.7*  MG 1.4* 1.8   Liver Function Tests Recent Labs    10/14/20 0834  AST 28  ALT 34  ALKPHOS 61  BILITOT 1.0  PROT 7.7  ALBUMIN 3.1*   No results for input(s): LIPASE, AMYLASE in the last 72 hours. Cardiac Enzymes No results for input(s): CKTOTAL, CKMB, CKMBINDEX, TROPONINI in the last 72 hours.  BNP: BNP (last 3 results) Recent Labs    10/14/20 0834  BNP 2,938.5*    ProBNP (last 3 results) No results for input(s): PROBNP in the last 8760 hours.   D-Dimer No results for input(s): DDIMER in the last 72 hours. Hemoglobin A1C Recent Labs    10/14/20 1133  HGBA1C 6.0*   Fasting Lipid Panel Recent Labs    10/14/20 1133  CHOL 97  HDL 31*  LDLCALC 56  TRIG 48  CHOLHDL  3.1   Thyroid Function Tests Recent Labs    10/14/20 1133  TSH 1.243    Other results:   Imaging    No results found.   Medications:     Scheduled Medications: . digoxin  0.125 mg Oral Daily  . folic acid  1 mg Oral Daily  . furosemide  40 mg Intravenous BID  . multivitamin with minerals  1 tablet Oral Daily  . sacubitril-valsartan  1 tablet Oral BID  . sodium chloride flush  3 mL Intravenous Q12H  . spironolactone  12.5 mg Oral Daily  . thiamine  100 mg Oral Daily   Or  . thiamine  100 mg Intravenous Daily    Infusions: . sodium chloride    . heparin 2,100 Units/hr (10/15/20 2005)    PRN Medications: sodium chloride, acetaminophen, ondansetron (ZOFRAN) IV, polyethylene glycol, sodium chloride flush   Assessment/Plan   1. Acute Systolic Heart Failure - admitted w/ NYHA Class III symptoms + volume overload. BNP 2,900. CXR w/ cardiomegaly + pulmonary edema. HIV NR. TSH normal.  Markedly hypertensive in ED. HS trop 113>>93. EKG sinus tach w/ PACs - Echo shows BiVentricular dysfunction. LVEF 20-25% w/ global HK, mild LVH and moderately reduced RV (no prior study for comparison)  - responding well to IV diuresis but remains mildly fluid overloaded - Continue IV Lasix 40 mg bid through today, likely transition to PO diuretics tomorrow  - Continue Entresto 49-51 mg bid  - Increase Spiro to 25 mg daily  - will add low dose Coreg next if CO ok on RHC  - continue digoxin 0.125 mg  - eventual addition of SGLT2i (research following for potential DAPA trial)  - eventual addition of Bidil if BP tolerates  - plan R/LHC tomorrow - cMRI if cath unrevealing  - concern for underlying OSA based on history of snoring, apenic spells during sleep, daytime fatigue and HTN. Will need outpatient sleep study  - ETOH may also contribute. Needs to reduce intake   2. LV Thrombus  - seen on echo, measuring 2.4 cm x 1.3 cm. In setting of severe LV dysfunction  - Continue IV heparin, will need transition to oral anticoagulant after all  invasive procedures are completed.   3. Hypertension  - was diagnosed w/ HTN remotely but not on any medications PTA - markedly elevated in the ED 190/169 - improved today w/ diuresis and Entresto  - cont diuresis per above + entresto and spiro - follow BP closely  - plan outpatient sleep study per above  4. Renal Insuffiencey - Scr 1.46 on admit - unsure if acute or chronic, baseline SCr unknown - improving w/ diuresis, 1.3 today  - follow BMP closely     5. ETOH Abuse   - consumes 2-3 drinks of liquor/ day  -  CIWA protocol per primary team   6. Elevated Troponin  - Hs trop 113>>93, denies ischemic CP - trend not c/w ACS. Suspect likely 2/2 demand ischemia from acute CHF + HTN. However given LV dysfunction and FH, will need eventual LHC to exclude underlying coronary disease   7. Hypokalemia - K 3.6 today w/ diuresis - supp w/ KCl -  Continue Spiro  - f/u BMP   8. IDA - Anemia panel c/w IDA.  Hbg 11.6 Ferritin 25 Iron 22 Sats Ratio 6%  - give dose of feraheme - will need f/u w/ PCP post d/c    Length of Stay: 2  Robbie Lis, PA-C  10/16/2020, 9:46  AM  Advanced Heart Failure Team Pager [phone] (M-F; 7a - 4p)  Please contact [facility] for night-coverage after hours (4p -7a ) and weekends on [email or web address]   Patient seen and examined with the above-signed Advanced Practice Provider and/or Housestaff. I personally reviewed laboratory data, imaging studies and relevant notes. I independently examined the patient and formulated the important aspects of the plan. I have edited the note to reflect any of my changes or salient points. I have personally discussed the plan with the patient and/or family.  Diuresing well. Still volume overloaded. Breathing better but still with hacking cough. BP with improved controlled. Renal function stable.   General:  Sitting up in bed No resp difficulty HEENT: normal Neck: supple. JVP to jaw  Carotids 2+ bilat; no bruits. No lymphadenopathy or thryomegaly appreciated. Cor: PMI nondisplaced. Regular tachy + s3 Lungs: clear Abdomen: soft, nontender, nondistended. No hepatosplenomegaly. No bruits or masses. Good bowel sounds. Extremities: no cyanosis, clubbing, rash, 2+  edema Neuro: alert & orientedx3, cranial nerves grossly intact. moves all 4 extremities w/o difficulty. Affect pleasant  Continue diuresis. Will need R/L cath tomorrow. Supp lytes. Eventual sleep study.   Arvilla Meres, MD  6:00 PM

## 2020-10-16 NOTE — Progress Notes (Addendum)
  Advanced Heart Failure Rounding Note  PCP-Cardiologist: No primary care provider on file.   Subjective:    Continues to diurese well. 5.7L in UOP yesterday. Wt down another 7 lb.   SCr stable at 1.33. K 3.6.   Anemia panel c/w IDA.  Hbg 11.6 Ferritin 25 Iron 22 Sats Ratio 6%   He is feeling better. No resting dyspnea. Denies CP.     Objective:   Weight Range: 132.1 kg Body mass index is 36.4 kg/m.   Vital Signs:   Temp:  [98.1 F (36.7 C)-98.7 F (37.1 C)] 98.7 F (37.1 C) (12/01 0509) Pulse Rate:  [85-105] 104 (12/01 0509) Resp:  [18-20] 18 (12/01 0509) BP: (130-160)/(78-113) 160/113 (12/01 0509) SpO2:  [93 %-97 %] 95 % (12/01 0509) Weight:  [132.1 kg] 132.1 kg (12/01 0500)    Weight change: Filed Weights   10/14/20 0721 10/15/20 0505 10/16/20 0500  Weight: 129.3 kg 135.4 kg 132.1 kg    Intake/Output:   Intake/Output Summary (Last 24 hours) at 10/16/2020 0946 Last data filed at 10/16/2020 0600 Gross per 24 hour  Intake 1765.16 ml  Output 4850 ml  Net -3084.84 ml      Physical Exam    General:  Well appearing, moderately obese. No resp difficulty HEENT: Normal Neck: Supple. JVP ~8 cm . Carotids 2+ bilat; no bruits. No lymphadenopathy or thyromegaly appreciated. Cor: PMI nondisplaced. Regular rate & rhythm. No rubs, gallops or murmurs. Lungs: clear, no wheezing  Abdomen: obese and edematous, nontender, nondistended. No hepatosplenomegaly. No bruits or masses. Good bowel sounds. Extremities: No cyanosis, clubbing, rash, trace- 1+  bilateral LEE  Neuro: Alert & orientedx3, cranial nerves grossly intact. moves all 4 extremities w/o difficulty. Affect pleasant   Telemetry   NSR/ST 90s-low 100s. 5 beats NSVT   EKG    No new EKG to review   Labs    CBC Recent Labs    10/14/20 0834 10/14/20 0834 10/15/20 0736 10/16/20 0312  WBC 5.1   < > 5.7 5.3  NEUTROABS 2.6  --   --   --   HGB 12.2*   < > 11.8* 11.6*  HCT 39.8   < > 37.9* 37.1*   MCV 77.1*   < > 76.1* 76.2*  PLT 314   < > 272 298   < > = values in this interval not displayed.   Basic Metabolic Panel Recent Labs    10/15/20 0736 10/16/20 0312  NA 135 140  K 3.1* 3.6  CL 96* 97*  CO2 29 33*  GLUCOSE 86 135*  BUN 15 13  CREATININE 1.20 1.33*  CALCIUM 8.5* 8.7*  MG 1.4* 1.8   Liver Function Tests Recent Labs    10/14/20 0834  AST 28  ALT 34  ALKPHOS 61  BILITOT 1.0  PROT 7.7  ALBUMIN 3.1*   No results for input(s): LIPASE, AMYLASE in the last 72 hours. Cardiac Enzymes No results for input(s): CKTOTAL, CKMB, CKMBINDEX, TROPONINI in the last 72 hours.  BNP: BNP (last 3 results) Recent Labs    10/14/20 0834  BNP 2,938.5*    ProBNP (last 3 results) No results for input(s): PROBNP in the last 8760 hours.   D-Dimer No results for input(s): DDIMER in the last 72 hours. Hemoglobin A1C Recent Labs    10/14/20 1133  HGBA1C 6.0*   Fasting Lipid Panel Recent Labs    10/14/20 1133  CHOL 97  HDL 31*  LDLCALC 56  TRIG 48  CHOLHDL   3.1   Thyroid Function Tests Recent Labs    10/14/20 1133  TSH 1.243    Other results:   Imaging    No results found.   Medications:     Scheduled Medications: . digoxin  0.125 mg Oral Daily  . folic acid  1 mg Oral Daily  . furosemide  40 mg Intravenous BID  . multivitamin with minerals  1 tablet Oral Daily  . sacubitril-valsartan  1 tablet Oral BID  . sodium chloride flush  3 mL Intravenous Q12H  . spironolactone  12.5 mg Oral Daily  . thiamine  100 mg Oral Daily   Or  . thiamine  100 mg Intravenous Daily    Infusions: . sodium chloride    . heparin 2,100 Units/hr (10/15/20 2005)    PRN Medications: sodium chloride, acetaminophen, ondansetron (ZOFRAN) IV, polyethylene glycol, sodium chloride flush   Assessment/Plan   1. Acute Systolic Heart Failure - admitted w/ NYHA Class III symptoms + volume overload. BNP 2,900. CXR w/ cardiomegaly + pulmonary edema. HIV NR. TSH normal.  Markedly hypertensive in ED. HS trop 113>>93. EKG sinus tach w/ PACs - Echo shows BiVentricular dysfunction. LVEF 20-25% w/ global HK, mild LVH and moderately reduced RV (no prior study for comparison)  - responding well to IV diuresis but remains mildly fluid overloaded - Continue IV Lasix 40 mg bid through today, likely transition to PO diuretics tomorrow  - Continue Entresto 49-51 mg bid  - Increase Spiro to 25 mg daily  - will add low dose Coreg next if CO ok on RHC  - continue digoxin 0.125 mg  - eventual addition of SGLT2i (research following for potential DAPA trial)  - eventual addition of Bidil if BP tolerates  - plan R/LHC tomorrow - cMRI if cath unrevealing  - concern for underlying OSA based on history of snoring, apenic spells during sleep, daytime fatigue and HTN. Will need outpatient sleep study  - ETOH may also contribute. Needs to reduce intake   2. LV Thrombus  - seen on echo, measuring 2.4 cm x 1.3 cm. In setting of severe LV dysfunction  - Continue IV heparin, will need transition to oral anticoagulant after all  invasive procedures are completed.   3. Hypertension  - was diagnosed w/ HTN remotely but not on any medications PTA - markedly elevated in the ED 190/169 - improved today w/ diuresis and Entresto  - cont diuresis per above + entresto and spiro - follow BP closely  - plan outpatient sleep study per above  4. Renal Insuffiencey - Scr 1.46 on admit - unsure if acute or chronic, baseline SCr unknown - improving w/ diuresis, 1.3 today  - follow BMP closely     5. ETOH Abuse   - consumes 2-3 drinks of liquor/ day  -  CIWA protocol per primary team   6. Elevated Troponin  - Hs trop 113>>93, denies ischemic CP - trend not c/w ACS. Suspect likely 2/2 demand ischemia from acute CHF + HTN. However given LV dysfunction and FH, will need eventual LHC to exclude underlying coronary disease   7. Hypokalemia - K 3.6 today w/ diuresis - supp w/ KCl -  Continue Spiro  - f/u BMP   8. IDA - Anemia panel c/w IDA.  Hbg 11.6 Ferritin 25 Iron 22 Sats Ratio 6%  - give dose of feraheme - will need f/u w/ PCP post d/c    Length of Stay: 2  Brittainy Simmons, PA-C  10/16/2020, 9:46   AM  Advanced Heart Failure Team Pager [phone] (M-F; 7a - 4p)  Please contact [facility] for night-coverage after hours (4p -7a ) and weekends on [email or web address]   Patient seen and examined with the above-signed Advanced Practice Provider and/or Housestaff. I personally reviewed laboratory data, imaging studies and relevant notes. I independently examined the patient and formulated the important aspects of the plan. I have edited the note to reflect any of my changes or salient points. I have personally discussed the plan with the patient and/or family.  Diuresing well. Still volume overloaded. Breathing better but still with hacking cough. BP with improved controlled. Renal function stable.   General:  Sitting up in bed No resp difficulty HEENT: normal Neck: supple. JVP to jaw  Carotids 2+ bilat; no bruits. No lymphadenopathy or thryomegaly appreciated. Cor: PMI nondisplaced. Regular tachy + s3 Lungs: clear Abdomen: soft, nontender, nondistended. No hepatosplenomegaly. No bruits or masses. Good bowel sounds. Extremities: no cyanosis, clubbing, rash, 2+  edema Neuro: alert & orientedx3, cranial nerves grossly intact. moves all 4 extremities w/o difficulty. Affect pleasant  Continue diuresis. Will need R/L cath tomorrow. Supp lytes. Eventual sleep study.   Havoc Sanluis, MD  6:00 PM    

## 2020-10-16 NOTE — TOC Initial Note (Addendum)
Transition of Care [facility]) - Initial/Assessment Note    Patient Details  Name: Paul [name] MRN: [id number] Date of Birth: [birth year]  Transition of Care [facility]) CM/SW Contact:    Kingsley Plan, RN Phone Number: 10/16/2020, 10:41 AM  Clinical Narrative:                 Patient from home. Just moved here recently with his friend.   Patient lost his job, home and insurance due to covid.   NCM discussed PCP , patient does not have PCP. Discussed [facility]. Patient will discuss with his friend when she visits today. He is unfamiliar with the area.   NCM will return later today to discuss which clinic he prefers. Once patient decides NCM will call to schedule an appointment.    Discussed TOC pharmacy and MATCH program to assist with prescriptions at discharge. Patient voiced understanding and in agreement.  Pharmacy changed to [facility].  Patient has no DME at home.   1630 followed up with patient. He prefers follow up appointment at Patient [facility], however their first available is not until December 30, 2020. NCM scheduled a follow up appointment at [facility] October 30, 2020 at 1:30 pm. Patient aware and information on AVS   Expected Discharge Plan: Home/Self Care Barriers to Discharge: Continued Medical Work up   Patient Goals and CMS Choice Patient states their goals for this hospitalization and ongoing recovery are:: to return to home CMS Medicare.gov Compare Post Acute Care list provided to:: Patient Choice offered to / list presented to : Patient  Expected Discharge Plan and Services Expected Discharge Plan: Home/Self Care In-house Referral: Financial Counselor Discharge Planning Services: Medication Assistance, MATCH Program, CM Consult, Indigent Health Clinic   Living arrangements for the past 2 months: Single Family Home                   DME Agency: NA       HH Arranged: NA          Prior Living  Arrangements/Services Living arrangements for the past 2 months: Single Family Home Lives with:: Significant Other Patient language and need for interpreter reviewed:: Yes Do you feel safe going back to the place where you live?: Yes      Need for Family Participation in Patient Care: Yes (Comment) Care giver support system in place?: Yes (comment)   Criminal Activity/Legal Involvement Pertinent to Current Situation/Hospitalization: No - Comment as needed  Activities of Daily Living Home Assistive Devices/Equipment: None ADL Screening (condition at time of admission) Patient's cognitive ability adequate to safely complete daily activities?: Yes Is the patient deaf or have difficulty hearing?: No Does the patient have difficulty seeing, even when wearing glasses/contacts?: No Does the patient have difficulty concentrating, remembering, or making decisions?: No Patient able to express need for assistance with ADLs?: Yes Does the patient have difficulty dressing or bathing?: No Independently performs ADLs?: Yes (appropriate for developmental age) Does the patient have difficulty walking or climbing stairs?: No Weakness of Legs: None Weakness of Arms/Hands: None  Permission Sought/Granted   Permission granted to share information with : No              Emotional Assessment Appearance:: Appears stated age Attitude/Demeanor/Rapport: Engaged Affect (typically observed): Accepting Orientation: : Oriented to Self, Oriented to Place, Oriented to  Time, Oriented to Situation Alcohol / Substance Use: Not Applicable Psych Involvement: No (comment)  Admission diagnosis:  Shortness of breath [R06.02] Acute  heart failure (HCC) [I50.9] Acute on chronic congestive heart failure, unspecified heart failure type [facility]) [I50.9] Patient Active Problem List   Diagnosis Date Noted  . Acute heart failure (HCC) 10/14/2020   PCP:  Patient, No Pcp Per Pharmacy:   [facility] [address]  [place] - [address] [place] [address] Phone: [phone] Fax: [phone]  [facility] [place], [place] - [address] [place] [address] Phone: [phone] Fax: [phone]     Social Determinants of Health (SDOH) Interventions    Readmission Risk Interventions No flowsheet data found.

## 2020-10-16 NOTE — Plan of Care (Signed)
  Problem: Education: Goal: Knowledge of General Education information will improve Description: Including pain rating scale, medication(s)/side effects and non-pharmacologic comfort measures Outcome: Completed/Met

## 2020-10-16 NOTE — Progress Notes (Signed)
° °  Subjective:   Paul [name] had no acute events overnight. He states his SOB, cough, LE and abdominal swelling and fatigue have significantly improved with diuresis. He denies any CP, palpitations, or other symptoms. He notes hesitancy and discomfort using CPAP at night.   Objective:  Vital signs in last 24 hours: Vitals:   10/16/20 0352 10/16/20 0500 10/16/20 0509 10/16/20 1234  BP: 133/90  (!) 160/113 (!) 168/109  Pulse: (!) 101  (!) 104 (!) 105  Resp: 20  18 16   Temp: 98.7 F (37.1 C)  98.7 F (37.1 C) 98.6 F (37 C)  TempSrc: Oral  Oral Oral  SpO2: 95%  95% 94%  Weight:  132.1 kg    Height:       General: Patient is obese. He appears well. No acute distress. Eyes: Sclera non-icteric. No conjunctival injection.  Respiratory: There are bibasilar crackles, but no rhonchi or wheezing. Only mildly increased respiratory effort. He is stable on room air.  Cardiovascular: Regular rate and rhythm. No murmurs, rubs, or gallops. No lower extremity edema. Abdominal: Distended and obese but non-tender to palpation. Bowel sounds intact. No rebound or guarding. Skin: No lesions. No rashes.  Psych: Normal affect. Normal tone of voice.   Assessment/Plan:  Active Problems:   Acute heart failure (HCC)  # Acute Systolic Heart Failure Exacerbation  Initial BNP 2900. Patient has no known history of CHF but does have prolonged history of uncontrolled HTN. ECHO showed biventricular dysfunction with EF of 20-25%, global hypokinesis, mild LVH, LV thrombus 2.4x1.3cm and moderately decreased RV function. TSH, Lipids and A1c were unremarkable. - Heart Failure team is on board; appreciate their recommendations - Continue IV Heparin - Continue IV Lasix 40mg  twice daily  - Continue Entresto 49-51mg  twice daily - Continue Spironolactone 12.5mg  daily  - Continue Digoxin 0.125mg  daily  - Patient will get [facility] tomorrow and cMRI if unrevealing   # Likely OSA  Patient has characteristic symptoms of  nocturnal awakenings, snoring and fatigue with obesity. STOP-BANG score 7, high risk for OSA. - Continue CPAP at night - Patient will require outpatient sleep study   # AKI vs. CKD Patient has elevated but stable creatinine 1.46 > 1.33, with unknown baseline. Urinalysis was unremarkable.  - Continue fluid restriction given CHF exacerbation - Continue daily RFP/BMP  # Hypertension Patient's blood pressures remain significantly elevated. He had previously been on home HCTZ 12.5mg  years ago but has not taken any medications recently.  - Continue spironolactone and Entresto per HF team - Will likely improve with further diuresis   # Heavy Alcohol Use Patient endorses drinking > 2 alcoholic beverages per night but denies hx of withdrawal.  - Continue CIWA without Ativan - Folate, Thiamine   Prior to Admission Living Arrangement: Friend's home  Anticipated Discharge Location: Friend's home  Barriers to Discharge: Continued Diuresis / heart catheterization  06-20-1983, MD 10/16/2020, 3:18 PM Pager: [phone] After 5pm on weekdays and 1pm on weekends: On Call pager [phone]

## 2020-10-17 ENCOUNTER — Encounter: Payer: Self-pay | Admitting: *Deleted

## 2020-10-17 ENCOUNTER — Encounter (HOSPITAL_COMMUNITY): Payer: Self-pay | Admitting: Internal Medicine

## 2020-10-17 ENCOUNTER — Encounter (HOSPITAL_COMMUNITY)
Admission: EM | Disposition: A | Discharge status: Home / Self Care | Payer: Self-pay | Source: Home / Self Care | Attending: Internal Medicine

## 2020-10-17 DIAGNOSIS — I2510 Atherosclerotic heart disease of native coronary artery without angina pectoris: Secondary | ICD-10-CM

## 2020-10-17 DIAGNOSIS — F1020 Alcohol dependence, uncomplicated: Secondary | ICD-10-CM

## 2020-10-17 DIAGNOSIS — Z006 Encounter for examination for normal comparison and control in clinical research program: Secondary | ICD-10-CM

## 2020-10-17 HISTORY — PX: RIGHT/LEFT HEART CATH AND CORONARY ANGIOGRAPHY: CATH118266

## 2020-10-17 LAB — CBC
HCT: 38.7 % — ABNORMAL LOW (ref 39.0–52.0)
Hemoglobin: 11.8 g/dL — ABNORMAL LOW (ref 13.0–17.0)
MCH: 23.5 pg — ABNORMAL LOW (ref 26.0–34.0)
MCHC: 30.5 g/dL (ref 30.0–36.0)
MCV: 77.1 fL — ABNORMAL LOW (ref 80.0–100.0)
Platelets: 285 10*3/uL (ref 150–400)
RBC: 5.02 MIL/uL (ref 4.22–5.81)
RDW: 16.7 % — ABNORMAL HIGH (ref 11.5–15.5)
WBC: 5.2 10*3/uL (ref 4.0–10.5)
nRBC: 0 % (ref 0.0–0.2)

## 2020-10-17 LAB — POCT I-STAT 7, (LYTES, BLD GAS, ICA,H+H)
Acid-Base Excess: 7 mmol/L — ABNORMAL HIGH (ref 0.0–2.0)
Bicarbonate: 33.7 mmol/L — ABNORMAL HIGH (ref 20.0–28.0)
Calcium, Ion: 1.15 mmol/L (ref 1.15–1.40)
HCT: 37 % — ABNORMAL LOW (ref 39.0–52.0)
Hemoglobin: 12.6 g/dL — ABNORMAL LOW (ref 13.0–17.0)
O2 Saturation: 90 %
Potassium: 3.9 mmol/L (ref 3.5–5.1)
Sodium: 142 mmol/L (ref 135–145)
TCO2: 35 mmol/L — ABNORMAL HIGH (ref 22–32)
pCO2 arterial: 57.5 mmHg — ABNORMAL HIGH (ref 32.0–48.0)
pH, Arterial: 7.376 (ref 7.350–7.450)
pO2, Arterial: 62 mmHg — ABNORMAL LOW (ref 83.0–108.0)

## 2020-10-17 LAB — HEPARIN LEVEL (UNFRACTIONATED): Heparin Unfractionated: 0.78 IU/mL — ABNORMAL HIGH (ref 0.30–0.70)

## 2020-10-17 LAB — POCT I-STAT EG7
Acid-Base Excess: 8 mmol/L — ABNORMAL HIGH (ref 0.0–2.0)
Acid-Base Excess: 7 mmol/L — ABNORMAL HIGH (ref 0.0–2.0)
Bicarbonate: 34.2 mmol/L — ABNORMAL HIGH (ref 20.0–28.0)
Bicarbonate: 33.7 mmol/L — ABNORMAL HIGH (ref 20.0–28.0)
Calcium, Ion: 1.17 mmol/L (ref 1.15–1.40)
Calcium, Ion: 1.15 mmol/L (ref 1.15–1.40)
HCT: 37 % — ABNORMAL LOW (ref 39.0–52.0)
HCT: 38 % — ABNORMAL LOW (ref 39.0–52.0)
Hemoglobin: 12.6 g/dL — ABNORMAL LOW (ref 13.0–17.0)
Hemoglobin: 12.9 g/dL — ABNORMAL LOW (ref 13.0–17.0)
O2 Saturation: 60 %
O2 Saturation: 58 %
Potassium: 3.9 mmol/L (ref 3.5–5.1)
Potassium: 3.8 mmol/L (ref 3.5–5.1)
Sodium: 143 mmol/L (ref 135–145)
Sodium: 143 mmol/L (ref 135–145)
TCO2: 36 mmol/L — ABNORMAL HIGH (ref 22–32)
TCO2: 35 mmol/L — ABNORMAL HIGH (ref 22–32)
pCO2, Ven: 54.7 mmHg (ref 44.0–60.0)
pCO2, Ven: 53.9 mmHg (ref 44.0–60.0)
pH, Ven: 7.405 (ref 7.250–7.430)
pH, Ven: 7.404 (ref 7.250–7.430)
pO2, Ven: 32 mmHg (ref 32.0–45.0)
pO2, Ven: 31 mmHg — CL (ref 32.0–45.0)

## 2020-10-17 LAB — BASIC METABOLIC PANEL
Anion gap: 11 (ref 5–15)
BUN: 12 mg/dL (ref 6–20)
CO2: 29 mmol/L (ref 22–32)
Calcium: 9 mg/dL (ref 8.9–10.3)
Chloride: 99 mmol/L (ref 98–111)
Creatinine, Ser: 1.21 mg/dL (ref 0.61–1.24)
GFR, Estimated: >60 mL/min (ref >60)
Glucose, Bld: 104 mg/dL — ABNORMAL HIGH (ref 70–99)
Potassium: 4.3 mmol/L (ref 3.5–5.1)
Sodium: 139 mmol/L (ref 135–145)

## 2020-10-17 LAB — MAGNESIUM: Magnesium: 2 mg/dL (ref 1.7–2.4)

## 2020-10-17 SURGERY — RIGHT/LEFT HEART CATH AND CORONARY ANGIOGRAPHY
Anesthesia: LOCAL

## 2020-10-17 MED ORDER — CARVEDILOL 3.125 MG PO TABS
3.1250 mg | ORAL_TABLET | Freq: Two times a day (BID) | ORAL | Status: DC
  Administered 2020-10-17 – 2020-10-18 (×3): 3.125 mg via ORAL
  Filled 2020-10-17 (×3): qty 1

## 2020-10-17 MED ORDER — IOHEXOL 350 MG/ML SOLN
INTRAVENOUS | Status: DC | PRN
  Administered 2020-10-17: 75 mL

## 2020-10-17 MED ORDER — FENTANYL CITRATE (PF) 100 MCG/2ML IJ SOLN
INTRAMUSCULAR | Status: AC
  Filled 2020-10-17: qty 2

## 2020-10-17 MED ORDER — HEPARIN SODIUM (PORCINE) 1000 UNIT/ML IJ SOLN
INTRAMUSCULAR | Status: DC | PRN
  Administered 2020-10-17: 6500 [IU] via INTRAVENOUS

## 2020-10-17 MED ORDER — LIDOCAINE HCL (PF) 1 % IJ SOLN
INTRAMUSCULAR | Status: DC | PRN
  Administered 2020-10-17 (×2): 2 mL

## 2020-10-17 MED ORDER — MIDAZOLAM HCL 2 MG/2ML IJ SOLN
INTRAMUSCULAR | Status: AC
  Filled 2020-10-17: qty 2

## 2020-10-17 MED ORDER — FUROSEMIDE 10 MG/ML IJ SOLN
40.0000 mg | Freq: Two times a day (BID) | INTRAMUSCULAR | Status: DC
  Administered 2020-10-17 – 2020-10-19 (×4): 40 mg via INTRAVENOUS
  Filled 2020-10-17 (×3): qty 4

## 2020-10-17 MED ORDER — HEPARIN SODIUM (PORCINE) 1000 UNIT/ML IJ SOLN
INTRAMUSCULAR | Status: AC
  Filled 2020-10-17: qty 1

## 2020-10-17 MED ORDER — HYDRALAZINE HCL 20 MG/ML IJ SOLN: 10.0000 mg | INTRAMUSCULAR | Status: AC | PRN

## 2020-10-17 MED ORDER — STUDY - DAPA TIMI 68 - DAPAGLIFLOZIN (FARXIGA) 10 MG OR PLACEBO TABLET (PI-DALTON MCLEAN)
1.0000 | ORAL_TABLET | Freq: Every day | ORAL | Status: DC
  Administered 2020-10-17 – 2020-10-22 (×6): 1 via ORAL
  Filled 2020-10-17 (×6): qty 1

## 2020-10-17 MED ORDER — SODIUM CHLORIDE 0.9% FLUSH: 3.0000 mL | INTRAVENOUS | Status: DC | PRN

## 2020-10-17 MED ORDER — SODIUM CHLORIDE 0.9 % IV SOLN: 250.0000 mL | INTRAVENOUS | Status: DC | PRN

## 2020-10-17 MED ORDER — SODIUM CHLORIDE 0.9% FLUSH
3.0000 mL | Freq: Two times a day (BID) | INTRAVENOUS | Status: DC
  Administered 2020-10-17 – 2020-10-19 (×5): 3 mL via INTRAVENOUS

## 2020-10-17 MED ORDER — SODIUM CHLORIDE 0.9% FLUSH
3.0000 mL | INTRAVENOUS | Status: DC | PRN
  Administered 2020-10-19: 3 mL via INTRAVENOUS

## 2020-10-17 MED ORDER — SODIUM CHLORIDE 0.9 % IV SOLN: INTRAVENOUS | Status: AC

## 2020-10-17 MED ORDER — VERAPAMIL HCL 2.5 MG/ML IV SOLN
INTRAVENOUS | Status: DC | PRN
  Administered 2020-10-17: 10 mL via INTRA_ARTERIAL

## 2020-10-17 MED ORDER — LABETALOL HCL 5 MG/ML IV SOLN: 10.0000 mg | INTRAVENOUS | Status: AC | PRN

## 2020-10-17 MED ORDER — ACETAMINOPHEN 325 MG PO TABS: 650.0000 mg | ORAL_TABLET | ORAL | Status: DC | PRN

## 2020-10-17 MED ORDER — APIXABAN 5 MG PO TABS
5.0000 mg | ORAL_TABLET | Freq: Two times a day (BID) | ORAL | Status: DC
  Administered 2020-10-17 – 2020-10-22 (×10): 5 mg via ORAL
  Filled 2020-10-17 (×11): qty 1

## 2020-10-17 MED ORDER — HEPARIN (PORCINE) IN NACL 1000-0.9 UT/500ML-% IV SOLN
INTRAVENOUS | Status: AC
  Filled 2020-10-17: qty 1000

## 2020-10-17 MED ORDER — FENTANYL CITRATE (PF) 100 MCG/2ML IJ SOLN
INTRAMUSCULAR | Status: DC | PRN
  Administered 2020-10-17: 25 ug via INTRAVENOUS

## 2020-10-17 MED ORDER — LIDOCAINE HCL (PF) 1 % IJ SOLN
INTRAMUSCULAR | Status: AC
  Filled 2020-10-17: qty 30

## 2020-10-17 MED ORDER — SODIUM CHLORIDE 0.9 % IV SOLN: INTRAVENOUS | Status: DC

## 2020-10-17 MED ORDER — HEPARIN (PORCINE) IN NACL 1000-0.9 UT/500ML-% IV SOLN
INTRAVENOUS | Status: DC | PRN
  Administered 2020-10-17 (×2): 500 mL

## 2020-10-17 MED ORDER — MIDAZOLAM HCL 2 MG/2ML IJ SOLN
INTRAMUSCULAR | Status: DC | PRN
  Administered 2020-10-17 (×2): 1 mg via INTRAVENOUS

## 2020-10-17 MED ORDER — ISOSORB DINITRATE-HYDRALAZINE 20-37.5 MG PO TABS
1.0000 | ORAL_TABLET | Freq: Three times a day (TID) | ORAL | Status: DC
  Administered 2020-10-17 – 2020-10-18 (×4): 1 via ORAL
  Filled 2020-10-17 (×4): qty 1

## 2020-10-17 MED ORDER — ONDANSETRON HCL 4 MG/2ML IJ SOLN: 4.0000 mg | Freq: Four times a day (QID) | INTRAMUSCULAR | Status: DC | PRN

## 2020-10-17 MED ORDER — VERAPAMIL HCL 2.5 MG/ML IV SOLN
INTRAVENOUS | Status: AC
  Filled 2020-10-17: qty 2

## 2020-10-17 SURGICAL SUPPLY — 10 items
CATH 5FR JL3.5 JR4 ANG PIG MP (CATHETERS) ×1 IMPLANT
CATH SWAN GANZ 7F STRAIGHT (CATHETERS) ×1 IMPLANT
CATH VISTA GUIDE 6FR JL4 (CATHETERS) ×1 IMPLANT
DEVICE RAD COMP TR BAND LRG (VASCULAR PRODUCTS) ×1 IMPLANT
GLIDESHEATH SLEND SS 6F .021 (SHEATH) ×1 IMPLANT
GLIDESHEATH SLENDER 7FR .021G (SHEATH) ×1 IMPLANT
GUIDEWIRE INQWIRE 1.5J.035X260 (WIRE) IMPLANT
INQWIRE 1.5J .035X260CM (WIRE) ×2
PACK CARDIAC CATHETERIZATION (CUSTOM PROCEDURE TRAY) ×2 IMPLANT
TRANSDUCER W/STOPCOCK (MISCELLANEOUS) ×2 IMPLANT

## 2020-10-17 NOTE — Progress Notes (Addendum)
Advanced Heart Failure Rounding Note  PCP-Cardiologist: No primary care provider on file.   Subjective:   Yesterday diruesed with IV lasix and received dose of feraheme. Weight down another 13 pounds.   Feels good today. Denies chest pain/shortness of breath.    Objective:   Weight Range: 129.8 kg Body mass index is 35.77 kg/m.   Vital Signs:   Temp:  [97.8 F (36.6 C)-98.6 F (37 C)] 98.1 F (36.7 C) (12/02 0351) Pulse Rate:  [88-105] 98 (12/02 0351) Resp:  [16-20] 18 (12/02 0351) BP: (141-168)/(102-115) 160/115 (12/02 0351) SpO2:  [94 %-100 %] 95 % (12/02 0351) Weight:  [129.8 kg-135.9 kg] 129.8 kg (12/02 0736) Last BM Date: 10/16/20  Weight change: Filed Weights   10/16/20 0500 10/17/20 0351 10/17/20 0736  Weight: 132.1 kg 135.9 kg 129.8 kg    Intake/Output:   Intake/Output Summary (Last 24 hours) at 10/17/2020 0744 Last data filed at 10/16/2020 2220 Gross per 24 hour  Intake 1339.56 ml  Output 4625 ml  Net -3285.44 ml      Physical Exam    General: . No resp difficulty HEENT: normal Neck: supple. JVP 5-6  Carotids 2+ bilat; no bruits. No lymphadenopathy or thryomegaly appreciated. Cor: PMI nondisplaced. Regular rate & rhythm. No rubs, gallops or murmurs. Lungs: clear Abdomen: soft, nontender, nondistended. No hepatosplenomegaly. No bruits or masses. Good bowel sounds. Extremities: no cyanosis, clubbing, rash, edema Neuro: alert & orientedx3, cranial nerves grossly intact. moves all 4 extremities w/o difficulty. Affect pleasant   Telemetry   ST low 100s.   EKG    No new EKG to review   Labs    CBC Recent Labs    10/14/20 0834 10/15/20 0736 10/16/20 0312 10/17/20 0452  WBC 5.1   < > 5.3 5.2  NEUTROABS 2.6  --   --   --   HGB 12.2*   < > 11.6* 11.8*  HCT 39.8   < > 37.1* 38.7*  MCV 77.1*   < > 76.2* 77.1*  PLT 314   < > 298 285   < > = values in this interval not displayed.   Basic Metabolic Panel Recent Labs    96/04/54 0736  10/15/20 0736 10/16/20 0312 10/17/20 0452  NA 135   < > 140 139  K 3.1*   < > 3.6 4.3  CL 96*   < > 97* 99  CO2 29   < > 33* 29  GLUCOSE 86   < > 135* 104*  BUN 15   < > 13 12  CREATININE 1.20   < > 1.33* 1.21  CALCIUM 8.5*   < > 8.7* 9.0  MG 1.4*  --  1.8  --    < > = values in this interval not displayed.   Liver Function Tests Recent Labs    10/14/20 0834  AST 28  ALT 34  ALKPHOS 61  BILITOT 1.0  PROT 7.7  ALBUMIN 3.1*   No results for input(s): LIPASE, AMYLASE in the last 72 hours. Cardiac Enzymes No results for input(s): CKTOTAL, CKMB, CKMBINDEX, TROPONINI in the last 72 hours.  BNP: BNP (last 3 results) Recent Labs    10/14/20 0834  BNP 2,938.5*    ProBNP (last 3 results) No results for input(s): PROBNP in the last 8760 hours.   D-Dimer No results for input(s): DDIMER in the last 72 hours. Hemoglobin A1C Recent Labs    10/14/20 1133  HGBA1C 6.0*   Fasting Lipid Panel  Recent Labs    10/14/20 1133  CHOL 97  HDL 31*  LDLCALC 56  TRIG 48  CHOLHDL 3.1   Thyroid Function Tests Recent Labs    10/14/20 1133  TSH 1.243    Other results:   Imaging    No results found.   Medications:     Scheduled Medications: . digoxin  0.125 mg Oral Daily  . folic acid  1 mg Oral Daily  . furosemide  40 mg Intravenous BID  . multivitamin with minerals  1 tablet Oral Daily  . sacubitril-valsartan  1 tablet Oral BID  . sodium chloride flush  3 mL Intravenous Q12H  . sodium chloride flush  3 mL Intravenous Q12H  . spironolactone  25 mg Oral Daily  . thiamine  100 mg Oral Daily   Or  . thiamine  100 mg Intravenous Daily    Infusions: . sodium chloride    . sodium chloride 10 mL/hr at 10/17/20 0741  . heparin 2,000 Units/hr (10/17/20 0742)    PRN Medications: sodium chloride, acetaminophen, ondansetron (ZOFRAN) IV, polyethylene glycol, sodium chloride flush   Assessment/Plan   1. Acute Systolic Heart Failure - admitted w/ NYHA Class III  symptoms + volume overload. BNP 2,900. CXR w/ cardiomegaly + pulmonary edema. HIV NR. TSH normal. Markedly hypertensive in ED. HS trop 113>>93. EKG sinus tach w/ PACs - Echo shows BiVentricular dysfunction. LVEF 20-25% w/ global HK, mild LVH and moderately reduced RV (no prior study for comparison)  - Volume status improved and appears euvolemic. Hold IV lasix and adjust diuretics after cath.  -Continue Entresto 49-51 mg bid  - Continue Spiro to 25 mg daily  - will add low dose Coreg next if CO ok on RHC  - continue digoxin 0.125 mg  - eventual addition of SGLT2i (research following for potential DAPA trial)  - Add bidil 1 tab tid.  - Renal function stable.  - cMRI if cath unrevealing  - concern for underlying OSA based on history of snoring, apenic spells during sleep, daytime fatigue and HTN. Will need outpatient sleep study  - ETOH may also contribute. Needs to reduce intake   2. LV Thrombus  - seen on echo, measuring 2.4 cm x 1.3 cm. In setting of severe LV dysfunction  - Continue IV heparin, will need transition to oral anticoagulant after all  invasive procedures are completed.   3. Hypertension  - was diagnosed w/ HTN remotely but not on any medications PTA - Remains elevated. Add bidil today.   4. Renal Insuffiencey - Scr 1.46 on admit - unsure if acute or chronic, baseline SCr unknown - Creatinine 1.22 today.  - follow BMP closely     5. ETOH Abuse   - consumes 2-3 drinks of liquor/ day  -  CIWA protocol per primary team   6. Elevated Troponin  - Hs trop 113>>93, denies ischemic CP - trend not c/w ACS. Suspect likely 2/2 demand ischemia from acute CHF + HTN. However given LV dysfunction and FH will need cath later today.   7. Hypokalemia - Stable today.   8. IDA -Iron sats low.  - Received feraheme 12/1    Plan for cath later today . Consult cardiac rehab.   Length of Stay: 3  Amy Clegg, NP  10/17/2020, 7:44 AM  Advanced Heart Failure Team Pager  [phone] (M-F; 7a - 4p)  Please contact [facility] for night-coverage after hours (4p -7a ) and weekends on [email or web address]  Patient seen and  examined with the above-signed Advanced Practice Provider and/or Housestaff. I personally reviewed laboratory data, imaging studies and relevant notes. I independently examined the patient and formulated the important aspects of the plan. I have edited the note to reflect any of my changes or salient points. I have personally discussed the plan with the patient and/or family.  Continues to diurese well. Breathing much better. Remains tachy. Creatinine stable. On heparin. No bleeding.   General:  Lying in bed . No resp difficulty HEENT: normal Neck: supple. JVP 6. Carotids 2+ bilat; no bruits. No lymphadenopathy or thryomegaly appreciated. Cor: PMI nondisplaced. Mildly tachy  No rubs, gallops or murmurs. Lungs: clear Abdomen: soft, nontender, nondistended. No hepatosplenomegaly. No bruits or masses. Good bowel sounds. Extremities: no cyanosis, clubbing, rash, edema Neuro: alert & orientedx3, cranial nerves grossly intact. moves all 4 extremities w/o difficulty. Affect pleasant  He has diuresed well. Feeling better. Still tachycardic. Will plan for R/L cath today. No v-gram with LV clot. Switch to Eliquis after cath.   Arvilla Meres, MD  8:52 AM

## 2020-10-17 NOTE — Research (Signed)
   Randomization Visit Worksheet    PATIENT ID: [id number]   DATE OF VISIT:  December 2nd, 2021  1.  Confirmed all Inclusion Criteria have been met   [x]  Yes      []  No  2. Confirmed none of the Exclusion Criteria are present [x]  Yes       []  No   3. Vital Signs  Pulse: 92 bpm  Height: 75  []   cm  [x]  in (xxx.xx)  B/P: 134/87 mmHg  Body Weight: 130.4 [x]   kg  []  lbs  (xxx.xx)   4. HF Signs and Symptoms Assessed by: Preston Fleeting, RN  5.    Concomitant medications  Enter current (i.e., at randomization) heart failure and diabetes medications in the eCRF Request details of vasoactive medications given during index hospitalization.    6.     Local Labs Performed Creatinine: 1.21 mg/dL units mg/dL  Date: 10/17/2020  Potassium: 4.3 mmol/L units mmol/L  Date: 10/17/2020  7.      Was KCCQ questionnaire completed?  [x]  Yes  []  No   8.       Was patient successfully randomized?  [x]  Yes  []  No     9.      Study Drug Dispensed  [x]  Dispense Study Drug in [employer] EDC  Bottle number 1:  [id number]   Bottle number 2:  [id number]        [x]  Give first dose of study drug day of randomization   10.    Other Items  []  Schedule Week 1 Clinic Visit ___________________________        []  Schedule Month 1 Clinic Visit __________________________  [x]  Provide patient a copy of the signed ICF, patient ID card, patient medication instructions and          any other patient material  [x]  Remind patient of importance of complying with study medication        [x]  Remind patient to bring both study medication bottles (even if empty) to next clinic visit        [x]  Remind patient of importance of complying with study medication (1 pill per day)        [x]  Review Patient Contact Information Sheet, update if necessary             []  Complete Discharge Pages in eCRF after patient is discharged from Index Hospitalization   DAPA ACT HF-TIMI 68 Randomization Visit Worksheet Version 2.0 Date: 20 Jun 2020

## 2020-10-17 NOTE — Progress Notes (Signed)
ANTICOAGULATION CONSULT NOTE  Pharmacy Consult for Heparin Indication: LV Thrombus   Allergies  Allergen Reactions  . Shellfish Allergy Shortness Of Breath and Swelling    Throat swelling    Patient Measurements: Height: 6\' 3"  (190.5 cm) Weight: 135.9 kg (299 lb 8.6 oz) IBW/kg (Calculated) : 84.5 Heparin Dosing Weight: 112.7 kg  Vital Signs: Temp: 98.1 F (36.7 C) (12/02 0351) Temp Source: Oral (12/02 0351) BP: 160/115 (12/02 0351) Pulse Rate: 98 (12/02 0351)  Labs: Recent Labs     0000 10/14/20 0834 10/14/20 1133 10/14/20 2219 10/15/20 0736 10/15/20 0736 10/15/20 1542 10/15/20 1837 10/16/20 0312 10/17/20 0452  HGB   < > 12.2*  --   --  11.8*   < >  --   --  11.6* 11.8*  HCT   < > 39.8  --   --  37.9*  --   --   --  37.1* 38.7*  PLT   < > 314  --   --  272  --   --   --  298 285  HEPARINUNFRC  --   --   --    < > 0.24*  --    < > 0.77* 0.58 0.78*  CREATININE   < >  --   --   --  1.20  --   --   --  1.33* 1.21  TROPONINIHS  --  113* 93*  --   --   --   --   --   --   --    < > = values in this interval not displayed.    Estimated Creatinine Clearance: 96.5 mL/min (by C-G formula based on SCr of 1.21 mg/dL).   Medical History: Past Medical History:  Diagnosis Date  . CHF (congestive heart failure) (HCC)   . Dyspnea   . History of hip replacement   . Hypertension     Medications:  Scheduled:  . digoxin  0.125 mg Oral Daily  . folic acid  1 mg Oral Daily  . furosemide  40 mg Intravenous BID  . multivitamin with minerals  1 tablet Oral Daily  . sacubitril-valsartan  1 tablet Oral BID  . sodium chloride flush  3 mL Intravenous Q12H  . sodium chloride flush  3 mL Intravenous Q12H  . spironolactone  25 mg Oral Daily  . thiamine  100 mg Oral Daily   Or  . thiamine  100 mg Intravenous Daily    Assessment: Patient is a 59 yom that is being admitted for AHF, and an LV thrombus, pt started on IV heparin. Heparin level slightly above goal at 0.78, cath  planned today, CBC stable and wnl.  Goal of Therapy:  Heparin level 0.3-0.7 units/ml Monitor platelets by anticoagulation protocol: Yes   Plan:  -Reduce heparin 2000 units/h -F/U [facility] plans after cath   [facility], PharmD, BCPS, [facility] Clinical Pharmacist [phone] Please check AMION for all [facility] Pharmacy numbers 10/17/2020

## 2020-10-17 NOTE — Progress Notes (Signed)
Subjective:   Paul [name] had no acute events overnight. He did have one 25 beat run of VT following his heart catheterization and endorsed feeling light-headed and weak during this episode. His symptoms spontaneously resolved. He states he continues to have a cough mildly productive of clear sputum. His swelling continues to improve and he states he is only about 7 lbs up from his baseline. He no longer has dyspnea on exertion and feels much better without CP, fevers, chills, or any other symptoms.   Objective:  Vital signs in last 24 hours: Vitals:   10/17/20 1055 10/17/20 1105 10/17/20 1222 10/17/20 1508  BP: (!) 147/85 (!) 168/98 134/87   Pulse: 97 100 97   Resp: (!) 21 19 17 17   Temp:   98.4 F (36.9 C) 98.1 F (36.7 C)  TempSrc:   Oral Oral  SpO2: 96% 98% 95%   Weight:   130.4 kg   Height:   6\' 3"  (1.905 m)    General: Patient is obese. He appears well. No acute distress. Eyes: Sclera non-icteric. No conjunctival injection.  Respiratory: Lungs are CTA, bilaterally without increased work of breathing on room air.  Cardiovascular: Regular rate and rhythm. No murmurs, rubs, or gallops. There is trace bilateral lower extremity pitting edema below the knees.  Abdominal: Distended and obese but non-tender to palpation. Bowel sounds intact. No rebound or guarding. Skin: No lesions. No rashes.  Psych: Normal affect. Normal tone of voice.   Assessment/Plan:  Active Problems:   Acute heart failure (HCC)   OSA (obstructive sleep apnea)  # Acute Systolic Heart Failure Exacerbation  Initial BNP 2900. Patient has no known history of CHF but does have prolonged history of uncontrolled HTN. ECHO showed biventricular dysfunction with EF of 20-25%, global hypokinesis, mild LVH, LV thrombus 2.4x1.3cm and moderately decreased RV function. TSH, Lipids and A1c were unremarkable. R/LHC was performed today, which showed mild nonobstructive CAD, elevated filling pressures, and preserved CO. His EF  was 20%. - Heart Failure team is on board; appreciate their recommendations - IV Heparin will be switched to Eliquis 5mg  twice daily - Continue IV Lasix 40mg  twice daily (likely switch to PO tomorrow)  - Continue Entresto 49-51mg  twice daily - Continue Digoxin 0.125mg  daily  - Spironolactone increased to 25mg  daily - Coreg 3.125mg  twice daily with meals started as CO was okay on RHC - Eventual addition of SGLT2i (DAPA trial initiated) and Bidil if BP allows  - Continue telemetry monitoring / move to progressive care unit - Patient may require LifeVest at discharge  # Likely OSA  Patient has characteristic symptoms of nocturnal awakenings, snoring and fatigue with obesity. STOP-BANG score 7, high risk for OSA; mixed venous gas showed pH 7.405, pCO2 54.7, bicarb 34.2. - Continue CPAP at night - Patient will require outpatient sleep study   # AKI vs. CKD Patient has elevated but stable creatinine 1.46 > 1.21, with unknown baseline. Urinalysis was unremarkable. GFR > 60  - Continue fluid restriction given CHF exacerbation - Continue daily renal monitoring   # Hypertension Patient's blood pressures remain significantly elevated. He had previously been on home HCTZ 12.5mg  years ago but has not taken any medications recently.  - Continue spironolactone, Entresto, Coreg per HF team - Will likely improve with further diuresis   # Heavy Alcohol Use Patient endorses drinking > 2 alcoholic beverages per night but denies hx of withdrawal. May be contributing to heart disease. - Continue CIWA without Ativan - Folate, Thiamine  Prior to Admission Living Arrangement: Friend's home  Anticipated Discharge Location: Friend's home  Barriers to Discharge: Continued Diuresis / medication management  Glenford Bayley, MD 10/17/2020, 3:55 PM Pager: [phone] After 5pm on weekdays and 1pm on weekends: On Call pager [phone]

## 2020-10-17 NOTE — Interval H&P Note (Signed)
History and Physical Interval Note:  10/17/2020 8:26 AM  Paul [name]  has presented today for surgery, with the diagnosis of heart failure.  The various methods of treatment have been discussed with the patient and family. After consideration of risks, benefits and other options for treatment, the patient has consented to  Procedure(s): RIGHT/LEFT HEART CATH AND CORONARY ANGIOGRAPHY (N/A) and possible coronary angiography as a surgical intervention.  The patient's history has been reviewed, patient examined, no change in status, stable for surgery.  I have reviewed the patient's chart and labs.  Questions were answered to the patient's satisfaction.     Jailah Willis

## 2020-10-17 NOTE — Plan of Care (Signed)
  Problem: Clinical Measurements: Goal: Ability to maintain clinical measurements within normal limits will improve Outcome: Progressing Goal: Will remain free from infection Outcome: Progressing Goal: Diagnostic test results will improve Outcome: Progressing Goal: Respiratory complications will improve Outcome: Progressing Goal: Cardiovascular complication will be avoided Outcome: Progressing   Problem: Activity: Goal: Risk for activity intolerance will decrease Outcome: Progressing   Problem: Nutrition: Goal: Adequate nutrition will be maintained Outcome: Progressing   Problem: Coping: Goal: Level of anxiety will decrease Outcome: Progressing   Problem: Elimination: Goal: Will not experience complications related to bowel motility Outcome: Progressing Goal: Will not experience complications related to urinary retention Outcome: Progressing   Problem: Pain Managment: Goal: General experience of comfort will improve Outcome: Progressing   Problem: Safety: Goal: Ability to remain free from injury will improve Outcome: Progressing   Problem: Skin Integrity: Goal: Risk for impaired skin integrity will decrease Outcome: Progressing   Problem: Education: Goal: Ability to demonstrate management of disease process will improve Outcome: Progressing Goal: Ability to verbalize understanding of medication therapies will improve Outcome: Progressing Goal: Individualized Educational Video(s) Outcome: Progressing   Problem: Activity: Goal: Capacity to carry out activities will improve Outcome: Progressing   Problem: Cardiac: Goal: Ability to achieve and maintain adequate cardiopulmonary perfusion will improve Outcome: Progressing   

## 2020-10-17 NOTE — Progress Notes (Addendum)
Was CTB in post cath holding for 25 beat run on tele. Pt was symptomatic w/ palpitations.   R/LHC earlier today showed mild nonobstructive CAD, elevated filling pressures and preserved CO.   On my exam, he is A&Ox3. NSR on tele and asymptomatic.   EF 20%  K 4.3 on am labs.  Will check Stat Mg and will repleat if < 2.0  Given normal CO and HTN will start Coreg 3.25 mg bid Move to progressive care unit   Will d/w Dr. Gala Romney possible LifeVest at time of d/c   Robbie Lis, PA-C

## 2020-10-17 NOTE — Progress Notes (Signed)
Pt going for cardiac cath, asked to place cell phone, gold necklace, and watch in bag and placed that bag inside of pt's personal large black bag that's in the closet.

## 2020-10-17 NOTE — Progress Notes (Signed)
Order placed for ZOll Life Vest.  Aniesa Boback NP-C  8:04 PM

## 2020-10-17 NOTE — Research (Signed)
Subject Name: Paul [name]  Subject met inclusion and exclusion criteria.  The informed consent form, study requirements and expectations were reviewed with the subject and questions and concerns were addressed prior to the signing of the consent form.  The subject verbalized understanding of the trial requirements.  The subject agreed to participate in the [place] ACT-HF trial and signed the informed consent at 1351 on 10/17/20  The informed consent was obtained prior to performance of any protocol-specific procedures for the subject.  A copy of the signed informed consent was given to the subject and a copy was placed in the subject's medical record.   [name]

## 2020-10-17 NOTE — Progress Notes (Addendum)
Patient was sitting up watching T.V. when he went into a 25 beat run of monomorphic V-tach. Patient converted back to baseline rhythm on his own. Stated he did feel light-headed while in V-tach, states no other symptoms. Notified Boyce Medici, [place], who then came by to examine the patient.

## 2020-10-18 ENCOUNTER — Other Ambulatory Visit (HOSPITAL_COMMUNITY): Payer: Self-pay | Admitting: Adult Health

## 2020-10-18 LAB — CBC WITH DIFFERENTIAL/PLATELET
Abs Immature Granulocytes: 0.01 10*3/uL (ref 0.00–0.07)
Basophils Absolute: 0 10*3/uL (ref 0.0–0.1)
Basophils Relative: 1 %
Eosinophils Absolute: 0.4 10*3/uL (ref 0.0–0.5)
Eosinophils Relative: 8 %
HCT: 35.6 % — ABNORMAL LOW (ref 39.0–52.0)
Hemoglobin: 11.3 g/dL — ABNORMAL LOW (ref 13.0–17.0)
Immature Granulocytes: 0 %
Lymphocytes Relative: 35 %
Lymphs Abs: 1.8 10*3/uL (ref 0.7–4.0)
MCH: 23.9 pg — ABNORMAL LOW (ref 26.0–34.0)
MCHC: 31.7 g/dL (ref 30.0–36.0)
MCV: 75.4 fL — ABNORMAL LOW (ref 80.0–100.0)
Monocytes Absolute: 1.1 10*3/uL — ABNORMAL HIGH (ref 0.1–1.0)
Monocytes Relative: 22 %
Neutro Abs: 1.7 10*3/uL (ref 1.7–7.7)
Neutrophils Relative %: 34 %
Platelets: 301 10*3/uL (ref 150–400)
RBC: 4.72 MIL/uL (ref 4.22–5.81)
RDW: 16.5 % — ABNORMAL HIGH (ref 11.5–15.5)
WBC: 5 10*3/uL (ref 4.0–10.5)
nRBC: 0 % (ref 0.0–0.2)

## 2020-10-18 LAB — BASIC METABOLIC PANEL
Anion gap: 12 (ref 5–15)
BUN: 16 mg/dL (ref 6–20)
CO2: 27 mmol/L (ref 22–32)
Calcium: 9.2 mg/dL (ref 8.9–10.3)
Chloride: 100 mmol/L (ref 98–111)
Creatinine, Ser: 1.18 mg/dL (ref 0.61–1.24)
GFR, Estimated: >60 mL/min (ref >60)
Glucose, Bld: 104 mg/dL — ABNORMAL HIGH (ref 70–99)
Potassium: 4.4 mmol/L (ref 3.5–5.1)
Sodium: 139 mmol/L (ref 135–145)

## 2020-10-18 MED ORDER — FUROSEMIDE 40 MG PO TABS: 40.0000 mg | ORAL_TABLET | Freq: Two times a day (BID) | ORAL | 11 refills | Status: DC

## 2020-10-18 MED ORDER — SPIRONOLACTONE 25 MG PO TABS
25.0000 mg | ORAL_TABLET | Freq: Every day | ORAL | 11 refills | Status: DC
Start: 2020-10-19 — End: 2020-11-28

## 2020-10-18 MED ORDER — HYDRALAZINE HCL 25 MG PO TABS
37.5000 mg | ORAL_TABLET | Freq: Three times a day (TID) | ORAL | 11 refills | Status: DC
Start: 2020-10-18 — End: 2020-10-22

## 2020-10-18 MED ORDER — ATORVASTATIN CALCIUM 40 MG PO TABS: 40.0000 mg | ORAL_TABLET | Freq: Every day | ORAL | 11 refills | Status: DC

## 2020-10-18 MED ORDER — HYDRALAZINE HCL 25 MG PO TABS
37.5000 mg | ORAL_TABLET | Freq: Three times a day (TID) | ORAL | Status: DC
  Administered 2020-10-18 – 2020-10-20 (×4): 37.5 mg via ORAL
  Filled 2020-10-18 (×4): qty 2

## 2020-10-18 MED ORDER — ISOSORBIDE MONONITRATE ER 60 MG PO TB24
60.0000 mg | ORAL_TABLET | Freq: Every day | ORAL | 11 refills | Status: DC
Start: 2020-10-19 — End: 2020-10-22

## 2020-10-18 MED ORDER — CARVEDILOL 6.25 MG PO TABS
6.2500 mg | ORAL_TABLET | Freq: Two times a day (BID) | ORAL | Status: DC
  Administered 2020-10-18 – 2020-10-22 (×7): 6.25 mg via ORAL
  Filled 2020-10-18 (×7): qty 1

## 2020-10-18 MED ORDER — ISOSORB DINITRATE-HYDRALAZINE 20-37.5 MG PO TABS
1.0000 | ORAL_TABLET | Freq: Three times a day (TID) | ORAL | 11 refills | Status: DC
Start: 2020-10-18 — End: 2020-10-18

## 2020-10-18 MED ORDER — ISOSORBIDE MONONITRATE ER 60 MG PO TB24
60.0000 mg | ORAL_TABLET | Freq: Every day | ORAL | Status: DC
  Administered 2020-10-19: 60 mg via ORAL
  Filled 2020-10-18: qty 1

## 2020-10-18 MED ORDER — CARVEDILOL 6.25 MG PO TABS
6.2500 mg | ORAL_TABLET | Freq: Two times a day (BID) | ORAL | 11 refills | Status: DC
Start: 2020-10-18 — End: 2020-10-28

## 2020-10-18 MED ORDER — APIXABAN 5 MG PO TABS
5.0000 mg | ORAL_TABLET | Freq: Two times a day (BID) | ORAL | 11 refills | Status: DC
Start: 2020-10-18 — End: 2020-12-20

## 2020-10-18 MED ORDER — ATORVASTATIN CALCIUM 40 MG PO TABS
40.0000 mg | ORAL_TABLET | Freq: Every day | ORAL | Status: DC
  Administered 2020-10-18 – 2020-10-22 (×5): 40 mg via ORAL
  Filled 2020-10-18 (×5): qty 1

## 2020-10-18 MED ORDER — DIGOXIN 125 MCG PO TABS
0.1250 mg | ORAL_TABLET | Freq: Every day | ORAL | 11 refills | Status: DC
Start: 2020-10-19 — End: 2023-01-04

## 2020-10-18 MED ORDER — SACUBITRIL-VALSARTAN 97-103 MG PO TABS
1.0000 | ORAL_TABLET | Freq: Two times a day (BID) | ORAL | Status: DC
  Administered 2020-10-18 – 2020-10-22 (×7): 1 via ORAL
  Filled 2020-10-18 (×10): qty 1

## 2020-10-18 MED ORDER — SACUBITRIL-VALSARTAN 97-103 MG PO TABS
1.0000 | ORAL_TABLET | Freq: Two times a day (BID) | ORAL | 11 refills | Status: DC
Start: 2020-10-18 — End: 2020-12-17

## 2020-10-18 MED FILL — ELIQUIS 5 MG TABLET: 5 | 30 days supply | Qty: 60 | Fill #0

## 2020-10-18 MED FILL — hydrALAZINE HCL 25 MG TABS: 25 | 30 days supply | Qty: 135 | Fill #0

## 2020-10-18 MED FILL — FUROSEMIDE 40 MG TABLET: 40 | 30 days supply | Qty: 60 | Fill #0

## 2020-10-18 MED FILL — ATORVASTATIN CALCIUM 40 MG: 40 | 30 days supply | Qty: 30 | Fill #0

## 2020-10-18 MED FILL — CARVEDILOL 6.25 MG TABLET: 6.25 | 30 days supply | Qty: 60 | Fill #0

## 2020-10-18 MED FILL — SPIRONOLACTONE 25 MG TABLET: 25 | 30 days supply | Qty: 30 | Fill #0

## 2020-10-18 MED FILL — ISOSORBIDE MN ER 60 MG TAB: 60 | 30 days supply | Qty: 30 | Fill #0

## 2020-10-18 MED FILL — ENTRESTO 97 MG-103 MG TAB: 97-103 | 30 days supply | Qty: 60 | Fill #0

## 2020-10-18 MED FILL — DIGOXIN 0.125 MG TABLET: 125 | 30 days supply | Qty: 30 | Fill #0

## 2020-10-18 NOTE — Research (Signed)
Patient is enrolled in DAPA trial. When discharged, RN must pick up two bottles of study medication from pharmacy and give to patient to take home.        Annamaria Boots, RN  [phone]

## 2020-10-18 NOTE — Progress Notes (Signed)
Subjective:   Paul [name] did not have any acute events reported overnight. He states that he feels well this morning. He understands he will be going home with a Technical sales engineer. He states that his DOE has significantly improved as he's been diuresed and walking with PT. His swelling continues to improve. He continues to have a mild cough. He says he does not tolerate wearing his CPAP at night well as it is "noisy and uncomfortable". He is interested in a nose only piece. He notes he has been given a pill box and scale and is interested in taking ownership of his health. He would like to follow up in [facility] clinic. Denies any fevers, chills, CP or any other symptoms.   Objective:  Vital signs in last 24 hours: Vitals:   10/17/20 2132 10/18/20 0040 10/18/20 0457 10/18/20 0500  BP: 121/84 131/84  133/77  Pulse: 98 96 99   Resp: 16 17 (!) 22   Temp: 98.9 F (37.2 C) 98.6 F (37 C) 98.4 F (36.9 C)   TempSrc: Axillary Oral Oral   SpO2: 100% 100% 95%   Weight:   129.8 kg   Height:       General: Patient is obese. He appears well. No acute distress. Eyes: Sclera non-icteric. No conjunctival injection.  Respiratory: Lungs are CTA, bilaterally without increased work of breathing on room air. Mild tachypnea. Cardiovascular: Rate is borderline tachycardic. Rhythm is regular. No murmurs, rubs, or gallops. There is very minimal lower extremity pitting edema, R > L.  Neurological: Patient is alert and oriented x 3.  Abdominal: Distended and obese. Skin: No lesions. No rashes.  Psych: Normal affect. Normal tone of voice.   Assessment/Plan:  Active Problems:   Acute heart failure (HCC)   OSA (obstructive sleep apnea)  # Acute Systolic Heart Failure Exacerbation  Initial BNP 2900. Patient has no known history of CHF but does have prolonged history of uncontrolled HTN. ECHO showed biventricular dysfunction with EF of 20-25%, global hypokinesis, mild LVH, LV thrombus 2.4x1.3cm and moderately decreased RV  function. TSH, Lipids and A1c were unremarkable. R/LHC was performed, which showed mild nonobstructive CAD, elevated filling pressures, and preserved CO consistent with NICM, likely due to prolonged uncontrolled HTN, OSA, or heavy alcohol use. His EF was 20%. He did have 25 beats of symptomatic VT yesterday after heart cath but has only had infrequent beats of VT since yesterday, symptoms resolved aside from mild cough. - Heart Failure team is on board; appreciate their recommendations - Continue Eliquis 5mg  twice daily - Continue IV Lasix 40mg  twice daily for now; anticipate switch to Lasix 80mg  PO daily - Increased Coreg to 6.25mg  twice daily and Entresto to 97-103mg  twice daily - Continue Digoxin 0.125mg  daily  - Continue Spironolactone to 25mg  daily - Eventual addition of SGLT2i (research following for DAPA trial)  - Start Bidil 1 tab three times daily - Will get cMRI - timing per Dr.  - Continue telemetry monitoring  - Order placed for ZOII Life Vest - Schedule outpatient visit for patient to follow up with [facility] December 13th @ 1:15pm  # Likely OSA  Patient has characteristic symptoms of nocturnal awakenings, snoring and fatigue with obesity. STOP-BANG score 7, high risk for OSA; Initial venous gas showed pH 7.405, pCO2 54.7, bicarb 34.2. - Continue CPAP at night although patient will likely require nose-only mask due to intolerance  - Patient will require outpatient sleep study   # AKI vs. CKD Patient has elevated  but improving Creatinine 1.46 > 1.18, with unknown baseline. Urinalysis was unremarkable. GFR > 60  - Continue fluid restriction given CHF exacerbation - Continue daily renal monitoring   # Hypertension Patient's blood pressure has significantly improved with diuresis today. He had previously been on home HCTZ 12.5mg  years ago but has not taken any medications recently.  - Continue spironolactone, Entresto, Coreg per HF team - Will likely continue to improve with  further diuresis   # Heavy Alcohol Use Patient endorses drinking > 2 alcoholic beverages per night but denies hx of withdrawal. May be contributing to heart disease. - Continue CIWA without Ativan - Folate, Thiamine  - Educated on importance of alcohol cessation in controlling disease process  Prior to Admission Living Arrangement: Friend's home  Anticipated Discharge Location: Friend's home  Barriers to Discharge: Continued Diuresis / CMRI / medication management  Glenford Bayley, MD 10/18/2020, 7:46 AM Pager: [phone] After 5pm on weekdays and 1pm on weekends: On Call pager [phone]

## 2020-10-18 NOTE — Progress Notes (Addendum)
Advanced Heart Failure Rounding Note  PCP-Cardiologist: No primary care provider on file.   Subjective:   Yesterday had cath with non obs CAD and elevated filling pressures. Started back on IV lasix. Negative 2.3 liters.   Denies SOB.    Prox LAD to Mid LAD lesion is 30% stenosed.  Mid LAD lesion is 30% stenosed.  Dist LAD lesion is 30% stenosed.  1st Diag lesion is 70% stenosed.  Findings: Ao = 131/95 (112) LV = 131/26 RA = 14 RV = 39/8 PA = 45/28 (33) PCW = 25 Fick cardiac output/index = 7.0/2.7 Thermo CO/CI = 7.6/2.9 PVR = 1.1 PApi = 1.2 FA sat = 90% PA sat = 59%, 60% Assessment: 1. Mild CAD 2. 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 Elevated filling pressures    Objective:   Weight Range: 129.8 kg Body mass index is 35.76 kg/m.   Vital Signs:   Temp:  [98.1 F (36.7 C)-98.9 F (37.2 C)] 98.4 F (36.9 C) (12/03 0457) Pulse Rate:  [96-101] 99 (12/03 0457) Resp:  [16-25] 22 (12/03 0457) BP: (121-168)/(77-131) 133/77 (12/03 0500) SpO2:  [95 %-100 %] 95 % (12/03 0457) Weight:  [129.8 kg-130.4 kg] 129.8 kg (12/03 0457) Last BM Date: 10/16/20  Weight change: Filed Weights   10/17/20 0736 10/17/20 1222 10/18/20 0457  Weight: 129.8 kg 130.4 kg 129.8 kg    Intake/Output:   Intake/Output Summary (Last 24 hours) at 10/18/2020 1000 Last data filed at 10/18/2020 0636 Gross per 24 hour  Intake 1220 ml  Output 3600 ml  Net -2380 ml      Physical Exam    General:  Well appearing. No resp difficulty HEENT: Normal Neck: Supple. JVP 9-10  . Carotids 2+ bilat; no bruits. No lymphadenopathy or thyromegaly appreciated. Cor: PMI nondisplaced. Regular rate & rhythm. No rubs, gallops or murmurs. Lungs: Clear Abdomen: Soft, nontender, nondistended. No hepatosplenomegaly. No bruits or masses. Good bowel sounds. Extremities: No cyanosis, clubbing, rash, edema Neuro: Alert & orientedx3, cranial nerves grossly intact. moves all 4 extremities  w/o difficulty. Affect pleasant   Telemetry   ST with PVCs. 100s   EKG    n/a  Labs    CBC Recent Labs    10/17/20 0452 10/17/20 0914 10/17/20 0920 10/18/20 0233  WBC 5.2  --   --  5.0  NEUTROABS  --   --   --  1.7  HGB 11.8*   < > 12.9*  12.6* 11.3*  HCT 38.7*   < > 38.0*  37.0* 35.6*  MCV 77.1*  --   --  75.4*  PLT 285  --   --  301   < > = values in this interval not displayed.   Basic Metabolic Panel Recent Labs    73/42/87 0312 10/16/20 0312 10/17/20 0452 10/17/20 0914 10/17/20 0920 10/17/20 1100 10/18/20 0233  NA 140   < > 139   < > 143  143  --  139  K 3.6   < > 4.3   < > 3.8  3.9  --  4.4  CL 97*   < > 99  --   --   --  100  CO2 33*   < > 29  --   --   --  27  GLUCOSE 135*   < > 104*  --   --   --  104*  BUN 13   < > 12  --   --   --  16  CREATININE 1.33*   < > 1.21  --   --   --  1.18  CALCIUM 8.7*   < > 9.0  --   --   --  9.2  MG 1.8  --   --   --   --  2.0  --    < > = values in this interval not displayed.   Liver Function Tests No results for input(s): AST, ALT, ALKPHOS, BILITOT, PROT, ALBUMIN in the last 72 hours. No results for input(s): LIPASE, AMYLASE in the last 72 hours. Cardiac Enzymes No results for input(s): CKTOTAL, CKMB, CKMBINDEX, TROPONINI in the last 72 hours.  BNP: BNP (last 3 results) Recent Labs    10/14/20 0834  BNP 2,938.5*    ProBNP (last 3 results) No results for input(s): PROBNP in the last 8760 hours.   D-Dimer No results for input(s): DDIMER in the last 72 hours. Hemoglobin A1C No results for input(s): HGBA1C in the last 72 hours. Fasting Lipid Panel No results for input(s): CHOL, HDL, LDLCALC, TRIG, CHOLHDL, LDLDIRECT in the last 72 hours. Thyroid Function Tests No results for input(s): TSH, T4TOTAL, T3FREE, THYROIDAB in the last 72 hours.  Invalid input(s): FREET3  Other results:   Imaging     No results found.   Medications:     Scheduled Medications: . apixaban  5 mg Oral BID  .  carvedilol  3.125 mg Oral BID WC  . digoxin  0.125 mg Oral Daily  . folic acid  1 mg Oral Daily  . furosemide  40 mg Intravenous Q12H  . isosorbide-hydrALAZINE  1 tablet Oral TID  . multivitamin with minerals  1 tablet Oral Daily  . sacubitril-valsartan  1 tablet Oral BID  . sodium chloride flush  3 mL Intravenous Q12H  . sodium chloride flush  3 mL Intravenous Q12H  . sodium chloride flush  3 mL Intravenous Q12H  . spironolactone  25 mg Oral Daily  . DAPA TIMI 68 dapagliflozin or placebo  1 tablet Oral Daily  . thiamine  100 mg Oral Daily   Or  . thiamine  100 mg Intravenous Daily     Infusions: . sodium chloride    . sodium chloride    . sodium chloride    . sodium chloride 10 mL/hr at 10/17/20 1237  . sodium chloride       PRN Medications:  sodium chloride, sodium chloride, sodium chloride, acetaminophen, ondansetron (ZOFRAN) IV, polyethylene glycol, sodium chloride flush, sodium chloride flush, sodium chloride flush     Assessment/Plan  1. Acute Systolic Heart Failure - admitted w/ NYHA Class III symptoms+ volume overload. BNP 2,900. CXR w/ cardiomegaly + pulmonary edema. HIV NR. TSH normal. Markedly hypertensive in ED. HS trop 113>>93. EKG sinus tach w/ PACs - Echo shows BiVentricular dysfunction. LVEF 20-25% w/ global HK, mild LVH and moderately reduced RV (no prior study for comparison) Cath with mild CAD and elevated filling pressures. Started on IV lasix after cath. Volume status improving. Continue IV lasix today. Anticipate switching to lasix 80 mg po daily.  - Increase coreg to 6.25 mg twice a day.  - Increase entresto 97-103 mg twice a day  - Continue Spiro to 25 mg daily     - continue digoxin 0.125 mg  - eventual addition of SGLT2i (research following for potential DAPA trial)  - Continue bidil 1 tab tid.  - Renal function stable.  - Set up CMRI. Discuss timing with Dr Suella Broad.   -  concern for underlying OSA based on history of snoring, apenic spells  during sleep, daytime fatigueand HTN. Will need outpatient sleep study  - ETOH may also contribute. Needs to reduce intake   2. LV Thrombus  - seen on echo,measuring 2.4 cm x 1.3 cm. In setting of severe LV dysfunction  - Off heparin and placed on eliquis.   3. Hypertension  - Improving.   4. Renal Insuffiencey - Scr 1.46 on admit - unsure if acute or chronic, baseline SCr unknown - Creatinine 1.15   5. ETOH Abuse  - consumes 2-3 drinks of liquor/ day  - CIWA protocol per primary team  6. Elevated Troponin  - Hs trop 113>>93, denies ischemic CP - trend not c/w ACS. Suspect likely 2/2 demand ischemia from acute CHF + HTN.  - Cath However given LV dysfunction and FH will need cath later today.   7. Hypokalemia - Stable today.   8. IDA -Iron sats low.  - Received feraheme 12/1  9. NSVT  -Life Vest ordered for D/C  - Continue coreg  10. CAD LHC 10/17/20   Prox LAD to Mid LAD lesion is 30% stenosed.  Mid LAD lesion is 30% stenosed.  Dist LAD lesion is 30% stenosed.  1st Diag lesion is 70% stenosed.  Add statin   Consult cardiac rehab. He will need all meds prior to d/c. All meds sent to HF meds sent TOC today.   We will set up HF follow up.   Length of Stay: 4  Amy Clegg, NP  10/18/2020, 10:00 AM  Advanced Heart Failure Team Pager [phone] (M-F; 7a - 4p)  Please contact [facility] for night-coverage after hours (4p -7a ) and weekends on [email or web address]  Patient seen and examined with the above-signed Advanced Practice Provider and/or Housestaff. I personally reviewed laboratory data, imaging studies and relevant notes. I independently examined the patient and formulated the important aspects of the plan. I have edited the note to reflect any of my changes or salient points. I have personally discussed the plan with the patient and/or family.  Results of cath reviewed. Non-obstructive CAD. Elevated filling pressures.   Now back on IV lasix. Diuresing  well.Had NSVT yesterday. Breathing better but still with cough. No CP.   General:  Well appearing. No resp difficulty HEENT: normal Neck: supple. JVP to jaw Carotids 2+ bilat; no bruits. No lymphadenopathy or thryomegaly appreciated. Cor: PMI nondisplaced. Regular rate & rhythm. No rubs, gallops or murmurs. Lungs: clear Abdomen: soft, nontender, nondistended. No hepatosplenomegaly. No bruits or masses. Good bowel sounds. Extremities: no cyanosis, clubbing, rash, 1+  edema Neuro: alert & orientedx3, cranial nerves grossly intact. moves all 4 extremities w/o difficulty. Affect pleasant  Continue IV diuresis. Continue to titrated GDMT. Will need LifeVest at d/c. Keep K > 4.0 Mg > 2.0. cMRI ordered. On Eliquis for LV clot. Discussed dosing with PharmD personally.  Arvilla Meres, MD  4:02 PM

## 2020-10-18 NOTE — Progress Notes (Signed)
CARDIAC REHAB PHASE I   PRE:  Rate/Rhythm: 102 ST  BP:  Supine: 125/78  Sitting:   Standing:    SaO2: 95%RA  MODE:  Ambulation: 600 ft   POST:  Rate/Rhythm: 115 ST multi focal PVCs  BP:  Supine:   Sitting: 136/87  Standing:    SaO2: 96%RA 1006-1120 Pt walked 600 ft on RA with steady gait and tolerated well. Sats good on RA. Gave pt CHF booklet and reviewed zones. Discussed signs/symptoms of CHF and importance of calling MD. Pt has scale in room for home. Knows to weigh each morning and notify if 3 lbs overnight or 5 in week. Gave low sodium diets and discussed 2000 mg sodium restriction. Gave walking instruction for exercise, listening to body. Discussed CRP 2 and referred to [place] . Pt interested in Virtual App if appropriate later in future. Going home with lifevest.   Luetta Nutting, RN BSN  10/18/2020 11:16 AM

## 2020-10-18 NOTE — Progress Notes (Signed)
RT spoke to patient about wearing CPAP and patient states he was unable to tolerate and that he needs a sleep study.  Patient will speak to MD about sleep study.  Patient is in no distress and vitals WNL.

## 2020-10-18 NOTE — Progress Notes (Signed)
Notified Amy Clegg NP that MRI called and stated that cardiac MRI would not be done until Monday. Emelda Brothers RN

## 2020-10-19 LAB — BASIC METABOLIC PANEL
Anion gap: 12 (ref 5–15)
BUN: 20 mg/dL (ref 6–20)
CO2: 26 mmol/L (ref 22–32)
Calcium: 9.3 mg/dL (ref 8.9–10.3)
Chloride: 99 mmol/L (ref 98–111)
Creatinine, Ser: 1.14 mg/dL (ref 0.61–1.24)
GFR, Estimated: >60 mL/min (ref >60)
Glucose, Bld: 82 mg/dL (ref 70–99)
Potassium: 4.2 mmol/L (ref 3.5–5.1)
Sodium: 137 mmol/L (ref 135–145)

## 2020-10-19 LAB — MAGNESIUM: Magnesium: 1.9 mg/dL (ref 1.7–2.4)

## 2020-10-19 MED ORDER — ISOSORBIDE MONONITRATE ER 30 MG PO TB24
30.0000 mg | ORAL_TABLET | Freq: Every day | ORAL | Status: DC
  Administered 2020-10-20: 30 mg via ORAL
  Filled 2020-10-19: qty 1

## 2020-10-19 MED ORDER — SODIUM CHLORIDE 0.9 % IV SOLN: INTRAVENOUS | Status: DC

## 2020-10-19 MED ORDER — LACTATED RINGERS IV BOLUS
250.0000 mL | Freq: Once | INTRAVENOUS | Status: AC
  Administered 2020-10-19: 250 mL via INTRAVENOUS

## 2020-10-19 NOTE — Progress Notes (Signed)
Patient stated he does not use a CPAP at home and is not interested in using one here.  No distress was noted at the time, will continue to monitor.

## 2020-10-19 NOTE — Progress Notes (Addendum)
Notified Dr. Laddie Aquas of patient's BP 84/60. Patient is lying in bed, awake, alert and oriented, stating that he feels "sluggish." Educated patient not to get out of bed without assistance due to low BP. Orders from Dr. Laddie Aquas to notify MD of current BP prior to giving afternoon hydralazine or carvedilol. RN will continue to monitor patient closely.

## 2020-10-19 NOTE — Progress Notes (Signed)
CARDIAC REHAB PHASE I   PRE:  Rate/Rhythm: 100 SR  BP:  Supine: 131/88  Sitting:   Standing:    SaO2: 97 RA  MODE:  Ambulation: 600 ft   POST:  Rate/Rhythm: 118 ST  BP:  Supine:   Sitting: 146/105  Standing:    SaO2: 96 RA 0940-1015 Pt tolerated ambulation well without c/o. Gait steady. BP after walk 146/105. Pt to recliner after walk with call light in reach.Reviewed CHF education with pt. He voices understanding. Encouraged pt to watch CHF and Life vest videos on TV.   Melina Copa RN 10/19/2020 10:13 AM

## 2020-10-19 NOTE — Progress Notes (Signed)
Subjective:   Paul [name] did not have any acute events reported overnight; however, nursing staff paged this morning as patient developed symptomatic hypotension. He states he had just gotten up to go to the bathroom, felt fine when standing up and urinating, then became light-headed and dizzy with "fogged" vision while sitting to eat lunch. His episode felt similar to when he had runs of VT, although telemetry was unremarkable and this episode lasted longer (~20 minutes). On interview, he says that he is feeling better although more worn out than yesterday. He denies any SOB or LE swelling but continues to endorse improving productive cough. Denies any CP with his symptoms or palpitations. Continued to endorse feeling fatigued this afternoon.  Objective:  Vital signs in last 24 hours: Vitals:   10/19/20 1034 10/19/20 1233 10/19/20 1427 10/19/20 1600  BP: (!) 93/55 (!) 84/60 (!) 92/52 93/60  Pulse:   91 97  Resp:   16   Temp:   97.8 F (36.6 C)   TempSrc:   Oral   SpO2:   93% 90%  Weight:      Height:       General: Patient is obese. He appears well. No acute distress. Respiratory: Lungs are CTA, bilaterally without increased work of breathing on room air. Mild tachypnea. Cardiovascular: Rate is borderline tachycardic. Rhythm is regular. No murmurs, rubs, or gallops. No LE pitting edema.  Neurological: Patient is alert and oriented x 3.  Abdominal: Distended and obese, without tenderness to palpation. Psych: Normal affect. Normal tone of voice.   Assessment/Plan:  Active Problems:   Acute heart failure (HCC)   OSA (obstructive sleep apnea)  # Acute Systolic Heart Failure Exacerbation  Initial BNP 2900. Patient has no known history of CHF but does have prolonged history of uncontrolled HTN. ECHO showed biventricular dysfunction with EF of 20-25%, global hypokinesis, mild LVH, LV thrombus 2.4x1.3cm and moderately decreased RV function. TSH, Lipids and A1c were unremarkable. R/LHC was  performed, which showed mild nonobstructive CAD, elevated filling pressures, and preserved CO consistent with NICM, likely due to prolonged uncontrolled HTN, OSA, or heavy alcohol use. His EF was 20%. Today, his fatigue and light-headedness are likely due to low volume status given persistent hypotension in addition to increased dose of Imdur.  - Heart Failure team is on board; appreciate their recommendations - Patient received morning dose of Imdur 60mg  and spironolactone 25mg  daily - Held afternoon dose of carvedilol 6.25mg , Hydralazine 37.5mg , discontinued Lasix 40mg  IV  - Gave LR bolus given persistent hypotension despite holding antihypertensives - Continue Digoxin 0.125mg  daily  - Continue Spironolactone to 25mg  daily if blood pressure allows  - Eventual addition of SGLT2i (research following for DAPA trial)  - Continue Eliquis 5mg  twice daily - Will get cMRI Monday to rule out infiltrative process (and assess LV thrombus) - Continue telemetry monitoring  - Order placed for ZOII Life Vest - Schedule outpatient visit for patient to follow up with [facility] December 13th @ 1:15pm - Started Cardiac Rehab  # Likely OSA  Patient has characteristic symptoms of nocturnal awakenings, snoring and fatigue with obesity. STOP-BANG score 7, high risk for OSA; Initial venous gas showed pH 7.405, pCO2 54.7, bicarb 34.2. - Patient states he is unable to tolerate CPAP mask but is trying  - Would like to be fitted for new mask  - Patient will require outpatient sleep study   # AKI vs. CKD Patient has elevated but improving Creatinine 1.46 > 1.18 > 1.14,  with unknown baseline. Urinalysis was unremarkable. GFR > 60  - Monitor closely in setting of low volume status s/p fluids - Continue daily renal monitoring   # Heavy Alcohol Use Patient endorses drinking > 2 alcoholic beverages per night but denies hx of withdrawal. May be contributing to heart disease. - Continue CIWA without Ativan - Folate,  Thiamine   Prior to Admission Living Arrangement: Friend's home  Anticipated Discharge Location: Friend's home  Barriers to Discharge: Continued Diuresis / CMRI / medication management  Glenford Bayley, MD 10/19/2020, 7:08 PM Pager: [phone] After 5pm on weekdays and 1pm on weekends: On Call pager [phone]

## 2020-10-19 NOTE — Progress Notes (Signed)
Advanced Heart Failure Rounding Note  PCP-Cardiologist: No primary care provider on file.   Subjective:    Diuresed briskly overnight. Weight down 11 pounds. Feels better today. Still with cough and gets lightheaded when he coughs. No orthopnea or PND. No bleeding on Eliquis. Has been fit for Lifevest     Objective:   Weight Range: 125.1 kg Body mass index is 34.47 kg/m.   Vital Signs:   Temp:  [98.1 F (36.7 C)-98.2 F (36.8 C)] 98.2 F (36.8 C) (12/04 0419) Pulse Rate:  [68-100] 100 (12/04 0934) Resp:  [16-18] 18 (12/04 0419) BP: (130-134)/(76-94) 131/88 (12/04 0934) SpO2:  [98 %-100 %] 98 % (12/04 0419) Weight:  [125.1 kg] 125.1 kg (12/04 0419) Last BM Date: 10/18/20  Weight change: Filed Weights   10/17/20 1222 10/18/20 0457 10/19/20 0419  Weight: 130.4 kg 129.8 kg 125.1 kg    Intake/Output:   Intake/Output Summary (Last 24 hours) at 10/19/2020 1030 Last data filed at 10/19/2020 1023 Gross per 24 hour  Intake 1743.43 ml  Output 4725 ml  Net -2981.57 ml      Physical Exam    General:  Well appearing. No resp difficulty HEENT: normal Neck: supple. no JVD. Carotids 2+ bilat; no bruits. No lymphadenopathy or thryomegaly appreciated. Cor: PMI nondisplaced. Regular rate & rhythm. No rubs, gallops or murmurs. Lungs: clear Abdomen: soft, nontender, nondistended. No hepatosplenomegaly. No bruits or masses. Good bowel sounds. Extremities: no cyanosis, clubbing, rash, edema Neuro: alert & orientedx3, cranial nerves grossly intact. moves all 4 extremities w/o difficulty. Affect pleasant  Telemetry   Sinus 100-110 Personally reviewed   Labs    CBC Recent Labs    10/17/20 0452 10/17/20 0914 10/17/20 0920 10/18/20 0233  WBC 5.2  --   --  5.0  NEUTROABS  --   --   --  1.7  HGB 11.8*   < > 12.9*  12.6* 11.3*  HCT 38.7*   < > 38.0*  37.0* 35.6*  MCV 77.1*  --   --  75.4*  PLT 285  --   --  301   < > = values in this interval not displayed.    Basic Metabolic Panel Recent Labs    82/50/53 0452 10/17/20 1100 10/18/20 0233 10/19/20 0201  NA   < >  --  139 137  K   < >  --  4.4 4.2  CL   < >  --  100 99  CO2   < >  --  27 26  GLUCOSE   < >  --  104* 82  BUN   < >  --  16 20  CREATININE   < >  --  1.18 1.14  CALCIUM   < >  --  9.2 9.3  MG  --  2.0  --  1.9   < > = values in this interval not displayed.   Liver Function Tests No results for input(s): AST, ALT, ALKPHOS, BILITOT, PROT, ALBUMIN in the last 72 hours. No results for input(s): LIPASE, AMYLASE in the last 72 hours. Cardiac Enzymes No results for input(s): CKTOTAL, CKMB, CKMBINDEX, TROPONINI in the last 72 hours.  BNP: BNP (last 3 results) Recent Labs    10/14/20 0834  BNP 2,938.5*    ProBNP (last 3 results) No results for input(s): PROBNP in the last 8760 hours.   D-Dimer No results for input(s): DDIMER in the last 72 hours. Hemoglobin A1C No results for input(s): HGBA1C in  the last 72 hours. Fasting Lipid Panel No results for input(s): CHOL, HDL, LDLCALC, TRIG, CHOLHDL, LDLDIRECT in the last 72 hours. Thyroid Function Tests No results for input(s): TSH, T4TOTAL, T3FREE, THYROIDAB in the last 72 hours.  Invalid input(s): FREET3  Other results:   Imaging    No results found.   Medications:     Scheduled Medications: . apixaban  5 mg Oral BID  . atorvastatin  40 mg Oral Daily  . carvedilol  6.25 mg Oral BID WC  . digoxin  0.125 mg Oral Daily  . folic acid  1 mg Oral Daily  . furosemide  40 mg Intravenous Q12H  . hydrALAZINE  37.5 mg Oral TID  . isosorbide mononitrate  60 mg Oral Daily  . multivitamin with minerals  1 tablet Oral Daily  . sacubitril-valsartan  1 tablet Oral BID  . sodium chloride flush  3 mL Intravenous Q12H  . sodium chloride flush  3 mL Intravenous Q12H  . sodium chloride flush  3 mL Intravenous Q12H  . spironolactone  25 mg Oral Daily  . DAPA TIMI 68 dapagliflozin or placebo  1 tablet Oral Daily  .  thiamine  100 mg Oral Daily   Or  . thiamine  100 mg Intravenous Daily    Infusions: . sodium chloride    . sodium chloride    . sodium chloride    . sodium chloride 10 mL/hr at 10/17/20 1237  . sodium chloride      PRN Medications: sodium chloride, sodium chloride, sodium chloride, acetaminophen, ondansetron (ZOFRAN) IV, polyethylene glycol, sodium chloride flush, sodium chloride flush, sodium chloride flush     Assessment/Plan  1. Acute Systolic Heart Failure - admitted w/ NYHA Class III symptoms+ volume overload. BNP 2,900. CXR w/ cardiomegaly + pulmonary edema. HIV NR. TSH normal. Markedly hypertensive in ED. HS trop 113>>93. EKG sinus tach w/ PACs - Echo shows BiVentricular dysfunction. LVEF 20-25% w/ global HK, mild LVH and moderately reduced RV (no prior study for comparison) Cath with mild CAD and elevated filling pressures. - Volume status mildly low. Hold diuretics. Liberalize fluid today.  - Continue carvedilol 6.25 mg twice a day.  - Continue entresto 97-103 mg twice a day  - Continue Spiro to 25 mg daily     - continue digoxin 0.125 mg  - In DAPA trial - Continue bidil 1 tab tid.  - Renal function stable.  - Pending cMRI on Monday    - concern for underlying OSA based on history of snoring, apenic spells during sleep, daytime fatigueand HTN. Will need outpatient sleep study  - ETOH may also contribute. Needs to reduce intake   2. LV Thrombus  - seen on echo,measuring 2.4 cm x 1.3 cm. In setting of severe LV dysfunction  - On Eliquis. No bleeding  3. Hypertension  - Improving.   4. Renal Insuffiencey - Scr 1.46 on admit - unsure if acute or chronic, baseline SCr unknown - Creatinine stable 1.14  5. ETOH Abuse  - consumes 2-3 drinks of liquor/ day  - CIWA protocol per primary team  6. Hypokalemia - Stable today.   7. IDA -Iron sats low.  - Received feraheme 12/1  8. NSVT  -Life Vest ordered for D/C  - Continue coreg  9. CAD - on  statin. No ASA with Eliquis - LHC 10/17/20   Prox LAD to Mid LAD lesion is 30% stenosed.  Mid LAD lesion is 30% stenosed.  Dist LAD lesion is 30% stenosed.  1st Diag lesion is 70% stenosed.   Length of Stay: 5  Arvilla Meres, MD  10/19/2020, 10:30 AM  Advanced Heart Failure Team Pager [phone] (M-F; 7a - 4p)  Please contact [facility] for night-coverage after hours (4p -7a ) and weekends on [email or web address]

## 2020-10-19 NOTE — Progress Notes (Signed)
Patient c/o lightheadedness. Patient sitting up in recliner. Patient's BP was 131/88 prior to taking morning meds at 0941. Patient BP now 93/55. Patient has already been seen by Dr. Gala Romney, and internal medicine MD is currently rounding on patient. Will continue to monitor BP closely.

## 2020-10-19 NOTE — Progress Notes (Signed)
Paged by patient's RN, Minna Antis.  Patient's blood pressure continues to be on the low, ranging between systolic 80-90 and diastolic ~ 60.  Patient is feeling symptomatic with generalized weakness and malaise.  Hypotension likely secondary to overdiuresis and the increased dose of Imdur today. Unable to reach HF Team for discussion. We will hold his blood pressure medications this afternoon including Coreg, Hydralazine, and Entresto. Additionally, will give a small bolus of LR 250 cc.   Will continue to monitor.   Dr. Verdene Lennert Internal Medicine PGY-2  Pager: [phone] After 5pm on weekdays and 1pm on weekends: On Call pager [phone]  10/19/2020, 4:27 PM

## 2020-10-20 DIAGNOSIS — I952 Hypotension due to drugs: Secondary | ICD-10-CM

## 2020-10-20 LAB — MAGNESIUM: Magnesium: 2.1 mg/dL (ref 1.7–2.4)

## 2020-10-20 LAB — CBC WITH DIFFERENTIAL/PLATELET
Abs Immature Granulocytes: 0.01 10*3/uL (ref 0.00–0.07)
Basophils Absolute: 0 10*3/uL (ref 0.0–0.1)
Basophils Relative: 1 %
Eosinophils Absolute: 0.2 10*3/uL (ref 0.0–0.5)
Eosinophils Relative: 5 %
HCT: 41.3 % (ref 39.0–52.0)
Hemoglobin: 13 g/dL (ref 13.0–17.0)
Immature Granulocytes: 0 %
Lymphocytes Relative: 41 %
Lymphs Abs: 2.1 10*3/uL (ref 0.7–4.0)
MCH: 23.3 pg — ABNORMAL LOW (ref 26.0–34.0)
MCHC: 31.5 g/dL (ref 30.0–36.0)
MCV: 74 fL — ABNORMAL LOW (ref 80.0–100.0)
Monocytes Absolute: 0.9 10*3/uL (ref 0.1–1.0)
Monocytes Relative: 17 %
Neutro Abs: 1.9 10*3/uL (ref 1.7–7.7)
Neutrophils Relative %: 36 %
Platelets: 301 10*3/uL (ref 150–400)
RBC: 5.58 MIL/uL (ref 4.22–5.81)
RDW: 16.9 % — ABNORMAL HIGH (ref 11.5–15.5)
WBC: 5.1 10*3/uL (ref 4.0–10.5)
nRBC: 0 % (ref 0.0–0.2)

## 2020-10-20 LAB — COMPREHENSIVE METABOLIC PANEL
ALT: 60 U/L — ABNORMAL HIGH (ref 0–44)
AST: 59 U/L — ABNORMAL HIGH (ref 15–41)
Albumin: 2.8 g/dL — ABNORMAL LOW (ref 3.5–5.0)
Alkaline Phosphatase: 64 U/L (ref 38–126)
Anion gap: 11 (ref 5–15)
BUN: 29 mg/dL — ABNORMAL HIGH (ref 6–20)
CO2: 26 mmol/L (ref 22–32)
Calcium: 9.1 mg/dL (ref 8.9–10.3)
Chloride: 98 mmol/L (ref 98–111)
Creatinine, Ser: 1.62 mg/dL — ABNORMAL HIGH (ref 0.61–1.24)
GFR, Estimated: 48 mL/min — ABNORMAL LOW (ref >60)
Glucose, Bld: 102 mg/dL — ABNORMAL HIGH (ref 70–99)
Potassium: 4.1 mmol/L (ref 3.5–5.1)
Sodium: 135 mmol/L (ref 135–145)
Total Bilirubin: 0.9 mg/dL (ref 0.3–1.2)
Total Protein: 7.3 g/dL (ref 6.5–8.1)

## 2020-10-20 NOTE — Progress Notes (Signed)
   Subjective:   [name] noted yesterday that he continued to feel "crummy" into the evening. Today, he states he is feeling much better, without any light-headedness, fatigue, dizziness, weakness, CP, palpitations, SOB, or any other symptoms. He has an appetite, eating well.   Objective:  Vital signs in last 24 hours: Vitals:   10/20/20 0517 10/20/20 0520 10/20/20 0913 10/20/20 1350  BP:  (!) 144/93 (!) 151/101 97/65  Pulse:   98 91  Resp:   17 18  Temp: 98.2 F (36.8 C)  97.9 F (36.6 C) 97.8 F (36.6 C)  TempSrc: Oral  Oral Oral  SpO2:  98% 95% 93%  Weight:      Height:       General: Patient is obese. He appears well. No acute distress. Respiratory: Lungs are CTA, bilaterally without increased work of breathing on room air. No tachypnea. Cardiovascular: Rate is normal. Rhythm is regular. No murmurs, rubs, or gallops. No LE pitting edema.  Neurological: Patient is alert and oriented x 3.  Psych: Normal affect. Normal tone of voice.   Assessment/Plan:  Active Problems:   Acute heart failure (HCC)   OSA (obstructive sleep apnea)  # Acute Systolic Heart Failure Exacerbation  Initial BNP 2900. Patient has no known history of CHF but does have prolonged history of uncontrolled HTN. ECHO showed biventricular dysfunction with EF of 20-25%, global hypokinesis, mild LVH, LV thrombus 2.4x1.3cm and moderately decreased RV function. TSH, Lipids and A1c were unremarkable. R/LHC was performed, which showed mild nonobstructive CAD, elevated filling pressures, and preserved CO consistent with NICM, likely due to prolonged uncontrolled HTN, OSA, or heavy alcohol use. His EF was 20%. His hypotension yesterday reversed rapidly and he became hypertensive after antihypertensives and diuretics were held and he was given 250cc's IVF. However, after his morning antihypertensives were given, his blood pressure rapidly dropped again to 97/65. - Heart Failure team is on board; appreciate their  recommendations - Hold diuresis as patient currently appears euvolemic - Patient given morning antihypertensives; however, instructed nursing staff to hold afternoon/evening doses of hydralazine and Entresto for BP < 110/60 and to go ahead and continue Coreg.  - Continue Eliquis 5mg  twice daily - Plan is for cMRI Monday (if kidney function allows)  - Continue telemetry monitoring  - Order placed for ZOII Life Vest - Schedule outpatient visit for patient to follow up with [facility] December 13th @ 1:15pm - Continue Cardiac Rehab  # Likely OSA  Patient has characteristic symptoms of nocturnal awakenings, snoring and fatigue with obesity. STOP-BANG score 7, high risk for OSA; Initial venous gas showed pH 7.405, pCO2 54.7, bicarb 34.2. - Patient will require outpatient sleep study   # Acute AKI # Acute Transaminitis On arrival, Cr 1.46 which improved to 1.14 with diuresis; however, patient over-diuresed 10/19/20 and creatinine increased to 1.62, BUN 29, AST 59 and ALT 60, consistent with prerenal AKI and transaminitis.  - Continue daily renal monitoring  - Avoid nephrotoxic/hepatotoxic agents   Prior to Admission Living Arrangement: Friend's home  Anticipated Discharge Location: Friend's home  Barriers to Discharge: cMRI, HDS  06-20-1983, MD 10/20/2020, 3:32 PM Pager: [phone] After 5pm on weekdays and 1pm on weekends: On Call pager [phone]

## 2020-10-20 NOTE — Progress Notes (Signed)
Advanced Heart Failure Rounding Note  PCP-Cardiologist: No primary care provider on file.   Subjective:    Was volume depleted and hypotensive yesterday. Received IVF. Diuretics held.   Feeling much better. BPs back up to 140-150 this am. Creatinine 1.1 -> 1.6  Denies dizziness, CP, orthopnea or PND.   Objective:   Weight Range: 128.1 kg Body mass index is 35.3 kg/m.   Vital Signs:   Temp:  [97.8 F (36.6 C)-98.2 F (36.8 C)] 97.8 F (36.6 C) (12/05 1350) Pulse Rate:  [91-98] 91 (12/05 1350) Resp:  [17-18] 18 (12/05 1350) BP: (92-151)/(60-101) 97/65 (12/05 1350) SpO2:  [90 %-98 %] 93 % (12/05 1350) Weight:  [128.1 kg] 128.1 kg (12/05 0513) Last BM Date: 10/19/20  Weight change: Filed Weights   10/18/20 0457 10/19/20 0419 10/20/20 0513  Weight: 129.8 kg 125.1 kg 128.1 kg    Intake/Output:   Intake/Output Summary (Last 24 hours) at 10/20/2020 1447 Last data filed at 10/20/2020 1400 Gross per 24 hour  Intake 1688.39 ml  Output 625 ml  Net 1063.39 ml      Physical Exam    General:  Well appearing. No resp difficulty HEENT: normal Neck: supple. no JVD. Carotids 2+ bilat; no bruits. No lymphadenopathy or thryomegaly appreciated. Cor: PMI nondisplaced. Regular rate & rhythm. No rubs, gallops or murmurs. Lungs: clear Abdomen: obese soft, nontender, nondistended. No hepatosplenomegaly. No bruits or masses. Good bowel sounds. Extremities: no cyanosis, clubbing, rash, edema Neuro: alert & orientedx3, cranial nerves grossly intact. moves all 4 extremities w/o difficulty. Affect pleasant  Telemetry   Sinus 90-100 Personally reviewed   Labs    CBC Recent Labs    10/18/20 0233 10/20/20 0522  WBC 5.0 5.1  NEUTROABS 1.7 1.9  HGB 11.3* 13.0  HCT 35.6* 41.3  MCV 75.4* 74.0*  PLT 301 301   Basic Metabolic Panel Recent Labs    35/45/62 0201 10/20/20 0522  NA 137 135  K 4.2 4.1  CL 99 98  CO2 26 26  GLUCOSE 82 102*  BUN 20 29*  CREATININE 1.14  1.62*  CALCIUM 9.3 9.1  MG 1.9 2.1   Liver Function Tests Recent Labs    10/20/20 0522  AST 59*  ALT 60*  ALKPHOS 64  BILITOT 0.9  PROT 7.3  ALBUMIN 2.8*   No results for input(s): LIPASE, AMYLASE in the last 72 hours. Cardiac Enzymes No results for input(s): CKTOTAL, CKMB, CKMBINDEX, TROPONINI in the last 72 hours.  BNP: BNP (last 3 results) Recent Labs    10/14/20 0834  BNP 2,938.5*    ProBNP (last 3 results) No results for input(s): PROBNP in the last 8760 hours.   D-Dimer No results for input(s): DDIMER in the last 72 hours. Hemoglobin A1C No results for input(s): HGBA1C in the last 72 hours. Fasting Lipid Panel No results for input(s): CHOL, HDL, LDLCALC, TRIG, CHOLHDL, LDLDIRECT in the last 72 hours. Thyroid Function Tests No results for input(s): TSH, T4TOTAL, T3FREE, THYROIDAB in the last 72 hours.  Invalid input(s): FREET3  Other results:   Imaging    No results found.   Medications:     Scheduled Medications: . apixaban  5 mg Oral BID  . atorvastatin  40 mg Oral Daily  . carvedilol  6.25 mg Oral BID WC  . digoxin  0.125 mg Oral Daily  . folic acid  1 mg Oral Daily  . hydrALAZINE  37.5 mg Oral TID  . isosorbide mononitrate  30 mg Oral  Daily  . multivitamin with minerals  1 tablet Oral Daily  . sacubitril-valsartan  1 tablet Oral BID  . spironolactone  25 mg Oral Daily  . DAPA TIMI 68 dapagliflozin or placebo  1 tablet Oral Daily  . thiamine  100 mg Oral Daily   Or  . thiamine  100 mg Intravenous Daily    Infusions: . sodium chloride    . sodium chloride 10 mL/hr at 10/17/20 1237    PRN Medications: acetaminophen, ondansetron (ZOFRAN) IV, polyethylene glycol     Assessment/Plan   1. Acute Systolic Heart Failure - admitted w/ NYHA Class III symptoms+ volume overload. BNP 2,900. CXR w/ cardiomegaly + pulmonary edema. HIV NR. TSH normal. Markedly hypertensive in ED. HS trop 113>>93. EKG sinus tach w/ PACs - Echo shows  BiVentricular dysfunction. LVEF 20-25% w/ global HK, mild LVH and moderately reduced RV (no prior study for comparison) Cath with mild CAD and elevated filling pressures. - Volume status low yesterday. Diuretics held. Will continue to hold - Continue carvedilol 6.25 mg twice a day.  - Continue entresto 97-103 mg twice a day  - Continue Spiro to 25 mg daily     - continue digoxin 0.125 mg  - In DAPA trial - Hold Bidil for now.  - Pending cMRI tomorrow - concern for underlying OSA based on history of snoring, apenic spells during sleep, daytime fatigueand HTN. Will need outpatient sleep study  - ETOH may also contribute. Needs to reduce intake   2. LV Thrombus  - seen on echo,measuring 2.4 cm x 1.3 cm. In setting of severe LV dysfunction  - On Eliquis. No bleeding  3. Hypertension  - BP improved but dropped with over diuresis. Now rebounding  4. Renal Insuffiencey - Scr 1.46 on admit - unsure if acute or chronic, baseline SCr unknown - Creatinine dropped to 1.14 but back up to 1.6 today due to ATN. Avoid hypotension  5. ETOH Abuse  - consumes 2-3 drinks of liquor/ day  - CIWA protocol per primary team  6. Hypokalemia - Stable today.   7. IDA -Iron sats low.  - Received feraheme 12/1  8. NSVT  -Life Vest ordered for D/C  - Continue coreg  9. CAD, mild non-obstructive - on statin. No ASA with Eliquis - LHC 10/17/20   Prox LAD to Mid LAD lesion is 30% stenosed.  Mid LAD lesion is 30% stenosed.  Dist LAD lesion is 30% stenosed.  1st Diag lesion is 70% stenosed. - No s/s angina   Length of Stay: 6  Arvilla Meres, MD  10/20/2020, 2:47 PM  Advanced Heart Failure Team Pager [phone] (M-F; 7a - 4p)  Please contact [facility] for night-coverage after hours (4p -7a ) and weekends on [email or web address]

## 2020-10-20 NOTE — Hospital Course (Signed)
Acute Systolic Heart Failure Exacerbation  Initial BNP 2900. Patient has no known history of CHF but does have prolonged history of uncontrolled HTN. ECHO showed biventricular dysfunction with EF of 20-25%, global hypokinesis, mild LVH, LV thrombus 2.4x1.3cm and moderately decreased RV function. TSH, Lipids and A1c were unremarkable. R/LHC was performed, which showed mild nonobstructive CAD, elevated filling pressures, and preserved CO consistent with NICM, likely due to prolonged uncontrolled HTN, OSA, or heavy alcohol use. His EF was 20%. His hypotension yesterday reversed rapidly and he became hypertensive after antihypertensives and diuretics were held and he was given 250cc's IVF. However, after his morning antihypertensives were given, his blood pressure rapidly dropped again to 97/65. - Heart Failure team is on board; appreciate their recommendations - Hold diuresis as patient currently appears euvolemic - Patient given morning antihypertensives; however, instructed nursing staff to hold afternoon/evening doses of hydralazine and Entresto for BP < 110/60 and to go ahead and continue Coreg.  - Continue Eliquis 5mg  twice daily - Plan is for cMRI Monday (if kidney function allows)  - Continue telemetry monitoring  - Order placed for ZOII Life Vest - Schedule outpatient visit for patient to follow up with [facility] December 13th @ 1:15pm - Continue Cardiac Rehab  # Likely OSA  Patient has characteristic symptoms of nocturnal awakenings, snoring and fatigue with obesity. STOP-BANG score 7, high risk for OSA; Initial venous gas showed pH 7.405, pCO2 54.7, bicarb 34.2. - Patient will require outpatient sleep study   # Acute AKI # Acute Transaminitis On arrival, Cr 1.46 which improved to 1.14 with diuresis; however, patient over-diuresed 10/19/20 and creatinine increased to 1.62, BUN 29, AST 59 and ALT 60, consistent with prerenal AKI and transaminitis.  - Continue daily renal monitoring  - Avoid  nephrotoxic/hepatotoxic agents   # Heavy Alcohol Use Patient endorses drinking > 2 alcoholic beverages per night but denies hx of withdrawal. May be contributing to heart disease. - Continue CIWA without Ativan - Folate, Thiamine

## 2020-10-20 NOTE — Progress Notes (Signed)
Pt has refused cpap at this time stating it is uncomfortable and he is unable to rest while wearing it.  Pt has not had a sleep study and will speak with his MD about it since he has recently lost weight.  RT now d/cing device after third refusal.  RT will continue to monitor.

## 2020-10-21 ENCOUNTER — Inpatient Hospital Stay (HOSPITAL_COMMUNITY): Payer: Medicaid Other

## 2020-10-21 DIAGNOSIS — I5021 Acute systolic (congestive) heart failure: Secondary | ICD-10-CM

## 2020-10-21 LAB — CBC WITH DIFFERENTIAL/PLATELET
Abs Immature Granulocytes: 0.01 10*3/uL (ref 0.00–0.07)
Basophils Absolute: 0 10*3/uL (ref 0.0–0.1)
Basophils Relative: 1 %
Eosinophils Absolute: 0.3 10*3/uL (ref 0.0–0.5)
Eosinophils Relative: 6 %
HCT: 39.1 % (ref 39.0–52.0)
Hemoglobin: 12.1 g/dL — ABNORMAL LOW (ref 13.0–17.0)
Immature Granulocytes: 0 %
Lymphocytes Relative: 40 %
Lymphs Abs: 2.2 10*3/uL (ref 0.7–4.0)
MCH: 23.5 pg — ABNORMAL LOW (ref 26.0–34.0)
MCHC: 30.9 g/dL (ref 30.0–36.0)
MCV: 76.1 fL — ABNORMAL LOW (ref 80.0–100.0)
Monocytes Absolute: 1.1 10*3/uL — ABNORMAL HIGH (ref 0.1–1.0)
Monocytes Relative: 21 %
Neutro Abs: 1.7 10*3/uL (ref 1.7–7.7)
Neutrophils Relative %: 32 %
Platelets: 273 10*3/uL (ref 150–400)
RBC: 5.14 MIL/uL (ref 4.22–5.81)
RDW: 17.2 % — ABNORMAL HIGH (ref 11.5–15.5)
WBC: 5.3 10*3/uL (ref 4.0–10.5)
nRBC: 0 % (ref 0.0–0.2)

## 2020-10-21 LAB — COMPREHENSIVE METABOLIC PANEL
ALT: 72 U/L — ABNORMAL HIGH (ref 0–44)
AST: 59 U/L — ABNORMAL HIGH (ref 15–41)
Albumin: 2.8 g/dL — ABNORMAL LOW (ref 3.5–5.0)
Alkaline Phosphatase: 58 U/L (ref 38–126)
Anion gap: 9 (ref 5–15)
BUN: 31 mg/dL — ABNORMAL HIGH (ref 6–20)
CO2: 29 mmol/L (ref 22–32)
Calcium: 9.1 mg/dL (ref 8.9–10.3)
Chloride: 98 mmol/L (ref 98–111)
Creatinine, Ser: 1.53 mg/dL — ABNORMAL HIGH (ref 0.61–1.24)
GFR, Estimated: 52 mL/min — ABNORMAL LOW (ref >60)
Glucose, Bld: 100 mg/dL — ABNORMAL HIGH (ref 70–99)
Potassium: 4.7 mmol/L (ref 3.5–5.1)
Sodium: 136 mmol/L (ref 135–145)
Total Bilirubin: 0.4 mg/dL (ref 0.3–1.2)
Total Protein: 7 g/dL (ref 6.5–8.1)

## 2020-10-21 LAB — MAGNESIUM: Magnesium: 2.1 mg/dL (ref 1.7–2.4)

## 2020-10-21 LAB — URINALYSIS, ROUTINE W REFLEX MICROSCOPIC
Bacteria, UA: NONE SEEN
Bilirubin Urine: NEGATIVE
Glucose, UA: >=500 mg/dL — AB
Hgb urine dipstick: NEGATIVE
Ketones, ur: NEGATIVE mg/dL
Leukocytes,Ua: NEGATIVE
Nitrite: NEGATIVE
Protein, ur: NEGATIVE mg/dL
Specific Gravity, Urine: 1.02 (ref 1.005–1.030)
pH: 7 (ref 5.0–8.0)

## 2020-10-21 MED ORDER — GADOBUTROL 1 MMOL/ML IV SOLN
10.0000 mL | Freq: Once | INTRAVENOUS | Status: AC | PRN
  Administered 2020-10-21: 10 mL via INTRAVENOUS

## 2020-10-21 NOTE — Discharge Instructions (Signed)

## 2020-10-21 NOTE — Progress Notes (Signed)
[id number] Came to check on pt. Put on telemetry cables as they  had become disconnected.94 NSR. Pt stated he had walked earlier and plans to walk again after MRI. He stated MRI for 1000. No questions re ed done on previous visits.  Will sign off. Luetta Nutting RN BSN 10/21/2020 9:53 AM

## 2020-10-21 NOTE — Progress Notes (Signed)
Discussed MRI results with Dr Gala Romney.   Check Myeloma Panel now.   Set up PYP  Scan.     Aliani Caccavale NP-C  3:58 PM

## 2020-10-21 NOTE — Progress Notes (Addendum)
Subjective:   [name] refused to wear BiPAP last night. He states that he again is feeling much better today. He says he was able to get up and walk the halls without shortness of breath, fatigue, weakness, or any other symptoms. Denies CP, palpitations, fevers, chills, abdominal pain, swelling, or any other symptoms. Patient is very eager to go home.   Objective:  Vital signs in last 24 hours: Vitals:   10/20/20 2016 10/21/20 0534 10/21/20 0835 10/21/20 0846  BP: 121/73 (!) 134/91 132/89 132/89  Pulse: 94 98 94 94  Resp: _0 Temp: 98.1 F (36.7 C)  98.3 F (36.8 C)   TempSrc: Oral  Oral   SpO2: 98% 98%    Weight:  129 kg    Height:       General: Patient is obese. He appears well. No acute distress. Respiratory: Lungs are CTA, bilaterally without increased work of breathing on room air. No tachypnea. Cardiovascular: Rate is normal. Rhythm is regular. No murmurs, rubs, or gallops. No LE pitting edema.  Neurological: Patient is alert and oriented x 3.  Psych: Normal affect. Normal tone of voice.   Assessment/Plan:  Active Problems:   Acute heart failure (HCC)   OSA (obstructive sleep apnea)   Hypotension due to drugs  # Acute New-Onset Systolic Heart Failure Exacerbation  Initial BNP 2900. Patient has no known history of CHF but does have prolonged history of uncontrolled HTN. ECHO showed biventricular dysfunction with EF of 20-25%, global hypokinesis, mild LVH, LV thrombus 2.4x1.3cm and moderately decreased RV function. TSH, Lipids and A1c were unremarkable. R/LHC was performed, which showed mild nonobstructive CAD, elevated filling pressures, and preserved CO consistent with NICM. Likely multifactorial due to uncontrolled HTN, OSA, and possible infiltrative disease. cMRI today showed severe LVE w/ GH and septal/apical akinesis with EF 22%, decrease in thrombus size to 1x1.4cm, mild RVE w/ moderate reduction in RVEF 31%, trivial posterior lateral pericardial effusion,  and findings concerning for possible infiltrative cardiomyopathy. Patient does have increased protein gap up to 5.5. Consider amyloidosis, multiple myeloma given concurrent anemia. Patient's blood pressures have been mildly elevated without recurrence of hypotension since diuretics were held and gentle IVF's were given due to over-diuresis. Currently appears euvolemic. - Heart Failure team is on board; appreciate their recommendations - Will discuss cMRI results with cardiology - Continue Eliquis 9m twice daily - Continue Carvedilol 6.243mtwice daily - Continue Entresto 97-10375mwice daily - Continue Spironolactone 43m54mily - Continue Digoxin 0.143mg74mll need digitoxin levels checked - Enrolled in DAPA trial (Farxiga)  - Recommend holding Bidil given recent hypotension - Will discharge on Lasix 40mg 48my, holding for weight < 280lbs  - Continue telemetry monitoring  - [facility] - Continue Cardiac Rehab - Patient stable for discharge pending cMRI follow up, with LifeVest   # Likely OSA  Patient has characteristic symptoms of nocturnal awakenings, snoring and fatigue with obesity. STOP-BANG score 7, high risk for OSA; Initial venous gas showed pH 7.405, pCO2 54.7, bicarb 34.2. - Patient's oxygen saturations dropped to the 80%'s last night  - He did not wear his CPAP - Will need outpatient sleep study and likely nasal only CPAP  # Improving AKI # Acute Transaminitis Likely prerenal due to over-diuresis but may be ATN from prolonged hypotension. Creatinine continues to improve s/p gentle fluids with holding diuretics. Transaminitis slightly worse than yesterday, AST 59, ALT 72.  RUQ ultrasound showed normal parenchymal echogenicity without biliary pathology. - Trend CMP's if patient stays  - Avoid nephrotoxic/hepatotoxic agents   Prior to Admission Living Arrangement: Friend's home  Anticipated Discharge  Location: Friend's home  Barriers to Discharge: cMRI, HDS  Paul Bennett, MD 10/21/2020, 3:57 PM Pager: [phone] After 5pm on weekdays and 1pm on weekends: On Call pager [phone]

## 2020-10-21 NOTE — Progress Notes (Addendum)
Advanced Heart Failure Rounding Note  PCP-Cardiologist: No primary care provider on file.   Subjective:    Has AKI from over diuresis. SCr improved today w/ IVFs and diuretic hold, 1.14>>1.62>>1.53. SBP 130s-150s   Wt up 2 lb.  Awaiting cMRi today.   He is resting comfortably. No complaints today.    Objective:   Weight Range: 129 kg Body mass index is 35.54 kg/m.   Vital Signs:   Temp:  [97.8 F (36.6 C)-98.1 F (36.7 C)] 98.1 F (36.7 C) (12/05 2016) Pulse Rate:  [91-98] 98 (12/06 0534) Resp:  [17-20] 20 (12/06 0534) BP: (97-151)/(65-101) 134/91 (12/06 0534) SpO2:  [93 %-98 %] 98 % (12/06 0534) Weight:  [129 kg] 129 kg (12/06 0534) Last BM Date: 10/20/20  Weight change: Filed Weights   10/19/20 0419 10/20/20 0513 10/21/20 0534  Weight: 125.1 kg 128.1 kg 129 kg    Intake/Output:   Intake/Output Summary (Last 24 hours) at 10/21/2020 0719 Last data filed at 10/21/2020 0528 Gross per 24 hour  Intake 1336 ml  Output 1500 ml  Net -164 ml      Physical Exam     General:  Well appearing, moderately obese. No respiratory difficulty HEENT: normal Neck: supple. Thick neck no JVD. Carotids 2+ bilat; no bruits. No lymphadenopathy or thyromegaly appreciated. Cor: PMI nondisplaced. Regular rate & rhythm. No rubs, gallops or murmurs. Lungs: clear Abdomen: soft, nontender, nondistended. No hepatosplenomegaly. No bruits or masses. Good bowel sounds. Extremities: no cyanosis, clubbing, rash, edema Neuro: alert & oriented x 3, cranial nerves grossly intact. moves all 4 extremities w/o difficulty. Affect pleasant.   Telemetry   NSR 90s, no further NSVT Personally reviewed   Labs    CBC Recent Labs    10/20/20 0522 10/21/20 0356  WBC 5.1 5.3  NEUTROABS 1.9 1.7  HGB 13.0 12.1*  HCT 41.3 39.1  MCV 74.0* 76.1*  PLT 301 273   Basic Metabolic Panel Recent Labs    18/56/31 0522 10/21/20 0356  NA 135 136  K 4.1 4.7  CL 98 98  CO2 26 29  GLUCOSE 102*  100*  BUN 29* 31*  CREATININE 1.62* 1.53*  CALCIUM 9.1 9.1  MG 2.1 2.1   Liver Function Tests Recent Labs    10/20/20 0522 10/21/20 0356  AST 59* 59*  ALT 60* 72*  ALKPHOS 64 58  BILITOT 0.9 0.4  PROT 7.3 7.0  ALBUMIN 2.8* 2.8*   No results for input(s): LIPASE, AMYLASE in the last 72 hours. Cardiac Enzymes No results for input(s): CKTOTAL, CKMB, CKMBINDEX, TROPONINI in the last 72 hours.  BNP: BNP (last 3 results) Recent Labs    10/14/20 0834  BNP 2,938.5*    ProBNP (last 3 results) No results for input(s): PROBNP in the last 8760 hours.   D-Dimer No results for input(s): DDIMER in the last 72 hours. Hemoglobin A1C No results for input(s): HGBA1C in the last 72 hours. Fasting Lipid Panel No results for input(s): CHOL, HDL, LDLCALC, TRIG, CHOLHDL, LDLDIRECT in the last 72 hours. Thyroid Function Tests No results for input(s): TSH, T4TOTAL, T3FREE, THYROIDAB in the last 72 hours.  Invalid input(s): FREET3  Other results:   Imaging    No results found.   Medications:     Scheduled Medications: . apixaban  5 mg Oral BID  . atorvastatin  40 mg Oral Daily  . carvedilol  6.25 mg Oral BID WC  . digoxin  0.125 mg Oral Daily  . folic acid  1 mg Oral Daily  . multivitamin with minerals  1 tablet Oral Daily  . sacubitril-valsartan  1 tablet Oral BID  . spironolactone  25 mg Oral Daily  . DAPA TIMI 68 dapagliflozin or placebo  1 tablet Oral Daily  . thiamine  100 mg Oral Daily   Or  . thiamine  100 mg Intravenous Daily    Infusions: . sodium chloride    . sodium chloride 10 mL/hr at 10/17/20 1237    PRN Medications: acetaminophen, ondansetron (ZOFRAN) IV, polyethylene glycol     Assessment/Plan   1. Acute Systolic Heart Failure - admitted w/ NYHA Class III symptoms+ volume overload. BNP 2,900. CXR w/ cardiomegaly + pulmonary edema. HIV NR. TSH normal. Markedly hypertensive in ED. HS trop 113>>93. EKG sinus tach w/ PACs - Echo shows  BiVentricular dysfunction. LVEF 20-25% w/ global HK, mild LVH and moderately reduced RV (no prior study for comparison) Cath with mild CAD and elevated filling pressures. - Volume status low yesterday. Diuretics held. Will continue to hold - Continue carvedilol 6.25 mg twice a day.  - Continue entresto 97-103 mg twice a day  - Continue Spiro 25 mg daily   - Continue digoxin 0.125 mg. Check dig level in AM   - In DAPA trial - SBP 130s-150s, resume Bidil soon (avoid hypotension w/ AKI)  - cMRI today - concern for underlying OSA based on history of snoring, apenic spells during sleep, daytime fatigueand HTN. Will need outpatient sleep study  - ETOH may also contribute. Needs to reduce intake   2. LV Thrombus  - seen on echo,measuring 2.4 cm x 1.3 cm. In setting of severe LV dysfunction  - On Eliquis. No bleeding  3. Hypertension  - BP improved but dropped with over diuresis. Now rebounding  4. Renal Insuffiencey - Scr 1.46 on admit - unsure if acute or chronic, baseline SCr unknown - Creatinine dropped to 1.14 but back up to 1.6 due to ATN. 1.5 today. Avoid hypotension  5. ETOH Abuse  - consumes 2-3 drinks of liquor/ day  - CIWA protocol per primary team  6. Hypokalemia - resolved, K 4.7   7. IDA - Iron sats low.  - Received feraheme 12/1  8. NSVT  -Life Vest ordered for D/C  -Continue coreg  9. CAD, mild non-obstructive - on statin. No ASA with Eliquis - LHC 10/17/20   Prox LAD to Mid LAD lesion is 30% stenosed.  Mid LAD lesion is 30% stenosed.  Dist LAD lesion is 30% stenosed.  1st Diag lesion is 70% stenosed. - No s/s angina   Length of Stay: [address], PA-C  10/21/2020, 7:19 AM  Advanced Heart Failure Team Pager [phone] (M-F; 7a - 4p)  Please contact [facility] for night-coverage after hours (4p -7a ) and weekends on [email or web address]  Patient seen and examined with the above-signed Advanced Practice Provider and/or Housestaff. I  personally reviewed laboratory data, imaging studies and relevant notes. I independently examined the patient and formulated the important aspects of the plan. I have edited the note to reflect any of my changes or salient points. I have personally discussed the plan with the patient and/or family.  Much better this am. BP and creatinine better.Feels good. Weight up   General:  Well appearing. No resp difficulty HEENT: normal Neck: supple. no JVD. Carotids 2+ bilat; no bruits. No lymphadenopathy or thryomegaly appreciated. Cor: PMI nondisplaced. Regular rate & rhythm. No rubs, gallops or murmurs. Lungs: clear Abdomen: soft, nontender,  nondistended. No hepatosplenomegaly. No bruits or masses. Good bowel sounds. Extremities: no cyanosis, clubbing, rash, edema Neuro: alert & orientedx3, cranial nerves grossly intact. moves all 4 extremities w/o difficulty. Affect pleasant  Plan cMRI today then home with LifeVest. Would hold Bidil. Discharge on lasix 40 daily. Hold for weight less than 280.   Arvilla Meres, MD  8:26 AM

## 2020-10-22 ENCOUNTER — Inpatient Hospital Stay (HOSPITAL_COMMUNITY): Payer: Medicaid Other

## 2020-10-22 ENCOUNTER — Other Ambulatory Visit (HOSPITAL_COMMUNITY): Payer: Self-pay | Admitting: Student

## 2020-10-22 ENCOUNTER — Telehealth (HOSPITAL_COMMUNITY): Payer: Self-pay | Admitting: Pharmacy Technician

## 2020-10-22 DIAGNOSIS — I952 Hypotension due to drugs: Secondary | ICD-10-CM

## 2020-10-22 DIAGNOSIS — R7303 Prediabetes: Secondary | ICD-10-CM

## 2020-10-22 LAB — MULTIPLE MYELOMA PANEL, SERUM
Albumin SerPl Elph-Mcnc: 2.9 g/dL (ref 2.9–4.4)
Albumin/Glob SerPl: 0.8 (ref 0.7–1.7)
Alpha 1: 0.3 g/dL (ref 0.0–0.4)
Alpha2 Glob SerPl Elph-Mcnc: 1 g/dL (ref 0.4–1.0)
B-Globulin SerPl Elph-Mcnc: 1.2 g/dL (ref 0.7–1.3)
Gamma Glob SerPl Elph-Mcnc: 1.5 g/dL (ref 0.4–1.8)
Globulin, Total: 4 g/dL — ABNORMAL HIGH (ref 2.2–3.9)
IgA: 442 mg/dL — ABNORMAL HIGH (ref 90–386)
IgG (Immunoglobin G), Serum: 1649 mg/dL — ABNORMAL HIGH (ref 603–1613)
IgM (Immunoglobulin M), Srm: 61 mg/dL (ref 20–172)
Total Protein ELP: 6.9 g/dL (ref 6.0–8.5)

## 2020-10-22 LAB — COMPREHENSIVE METABOLIC PANEL
ALT: 73 U/L — ABNORMAL HIGH (ref 0–44)
AST: 57 U/L — ABNORMAL HIGH (ref 15–41)
Albumin: 2.8 g/dL — ABNORMAL LOW (ref 3.5–5.0)
Alkaline Phosphatase: 61 U/L (ref 38–126)
Anion gap: 9 (ref 5–15)
BUN: 26 mg/dL — ABNORMAL HIGH (ref 6–20)
CO2: 29 mmol/L (ref 22–32)
Calcium: 8.9 mg/dL (ref 8.9–10.3)
Chloride: 99 mmol/L (ref 98–111)
Creatinine, Ser: 1.43 mg/dL — ABNORMAL HIGH (ref 0.61–1.24)
GFR, Estimated: 56 mL/min — ABNORMAL LOW (ref >60)
Glucose, Bld: 95 mg/dL (ref 70–99)
Potassium: 4.7 mmol/L (ref 3.5–5.1)
Sodium: 137 mmol/L (ref 135–145)
Total Bilirubin: 0.7 mg/dL (ref 0.3–1.2)
Total Protein: 6.8 g/dL (ref 6.5–8.1)

## 2020-10-22 LAB — UREA NITROGEN, URINE: Urea Nitrogen, Ur: 691 mg/dL

## 2020-10-22 LAB — MAGNESIUM: Magnesium: 2.1 mg/dL (ref 1.7–2.4)

## 2020-10-22 LAB — DIGOXIN LEVEL: Digoxin Level: 0.5 ng/mL — ABNORMAL LOW (ref 1.0–2.0)

## 2020-10-22 MED ORDER — FUROSEMIDE 40 MG PO TABS
40.0000 mg | ORAL_TABLET | Freq: Every day | ORAL | 0 refills | Status: DC
Start: 2020-10-23 — End: 2020-11-28

## 2020-10-22 MED ORDER — STUDY - DAPA TIMI 68 - DAPAGLIFLOZIN (FARXIGA) 10 MG OR PLACEBO TABLET (PI-DALTON MCLEAN)
1.0000 | ORAL_TABLET | Freq: Every day | ORAL | 0 refills | Status: DC
Start: 2020-10-22 — End: 2020-12-20

## 2020-10-22 MED ORDER — FUROSEMIDE 40 MG PO TABS
40.0000 mg | ORAL_TABLET | Freq: Every day | ORAL | Status: DC
  Administered 2020-10-22: 40 mg via ORAL
  Filled 2020-10-22: qty 1

## 2020-10-22 MED ORDER — THIAMINE HCL 100 MG PO TABS
100.0000 mg | ORAL_TABLET | Freq: Every day | ORAL | 0 refills | Status: DC
Start: 2020-10-22 — End: 2020-11-05

## 2020-10-22 MED ORDER — FOLIC ACID 1 MG PO TABS
1.0000 mg | ORAL_TABLET | Freq: Every day | ORAL | 0 refills | Status: DC
Start: 2020-10-22 — End: 2020-11-05

## 2020-10-22 MED ORDER — TECHNETIUM TC 99M PYROPHOSPHATE
22.0000 | Freq: Once | INTRAVENOUS | Status: AC | PRN
  Administered 2020-10-22: 22 via INTRAVENOUS
  Filled 2020-10-22: qty 22

## 2020-10-22 MED FILL — FOLIC ACID 1 MG TABS: 1 | 30 days supply | Qty: 30 | Fill #0

## 2020-10-22 MED FILL — VITAMIN B-1 100 MG TABS: 100 | 30 days supply | Qty: 30 | Fill #0

## 2020-10-22 NOTE — Progress Notes (Signed)
Advanced Heart Failure Rounding Note  PCP-Cardiologist: No primary care provider on file.   Subjective:    cMRI yesterday  1. Severe LVE with global hypokinesis and septal / apical akinesis EF 22% 2. Delayed enhancement images show poor nulling of myocardium with distal septal and apical infarct 3.  1 cm x 1.4 cm mobile apical thrombus 4.  Mild RVE with moderate reduction in RVEF 31% 5. Combination of mid/basal hypertrophy poor nulling of myocardium, elevated ECV and mildly elevated T1 suggests possibility of infiltrative cardiomyopathy 6.  Trivial posterior lateral pericardial effusion  Feels good. No CP or SOB. BP stable  Objective:   Weight Range: 129.9 kg Body mass index is 35.79 kg/m.   Vital Signs:   Temp:  [98.2 F (36.8 C)-98.5 F (36.9 C)] 98.5 F (36.9 C) (12/07 0259) Pulse Rate:  [90-96] 96 (12/07 0259) Resp:  [18-20] 18 (12/06 2041) BP: (128-135)/(79-89) 128/84 (12/06 2041) SpO2:  [97 %-98 %] 98 % (12/07 0259) Weight:  [129.9 kg] 129.9 kg (12/07 0259) Last BM Date: 10/21/20  Weight change: Filed Weights   10/20/20 0513 10/21/20 0534 10/22/20 0259  Weight: 128.1 kg 129 kg 129.9 kg    Intake/Output:   Intake/Output Summary (Last 24 hours) at 10/22/2020 0756 Last data filed at 10/22/2020 0700 Gross per 24 hour  Intake --  Output 1475 ml  Net -1475 ml      Physical Exam    General:  Well appearing. No resp difficulty HEENT: normal Neck: supple. no JVD. Carotids 2+ bilat; no bruits. No lymphadenopathy or thryomegaly appreciated. Cor: PMI nondisplaced. Regular rate & rhythm. No rubs, gallops or murmurs. Lungs: clear Abdomen: soft, nontender, nondistended. No hepatosplenomegaly. No bruits or masses. Good bowel sounds. Extremities: no cyanosis, clubbing, rash, edema Neuro: alert & orientedx3, cranial nerves grossly intact. moves all 4 extremities w/o difficulty. Affect pleasant  Telemetry   NSR 90s, + PVCs brief NSVT Personally  reviewed   Labs    CBC Recent Labs    10/20/20 0522 10/21/20 0356  WBC 5.1 5.3  NEUTROABS 1.9 1.7  HGB 13.0 12.1*  HCT 41.3 39.1  MCV 74.0* 76.1*  PLT 301 273   Basic Metabolic Panel Recent Labs    66/06/30 0522 10/20/20 0522 10/21/20 0356 10/22/20 0224  NA 135   < > 136 137  K 4.1   < > 4.7 4.7  CL 98   < > 98 99  CO2 26   < > 29 29  GLUCOSE 102*   < > 100* 95  BUN 29*   < > 31* 26*  CREATININE 1.62*   < > 1.53* 1.43*  CALCIUM 9.1   < > 9.1 8.9  MG 2.1  --  2.1  --    < > = values in this interval not displayed.   Liver Function Tests Recent Labs    10/21/20 0356 10/22/20 0224  AST 59* 57*  ALT 72* 73*  ALKPHOS 58 61  BILITOT 0.4 0.7  PROT 7.0 6.8  ALBUMIN 2.8* 2.8*   No results for input(s): LIPASE, AMYLASE in the last 72 hours. Cardiac Enzymes No results for input(s): CKTOTAL, CKMB, CKMBINDEX, TROPONINI in the last 72 hours.  BNP: BNP (last 3 results) Recent Labs    10/14/20 0834  BNP 2,938.5*    ProBNP (last 3 results) No results for input(s): PROBNP in the last 8760 hours.   D-Dimer No results for input(s): DDIMER in the last 72 hours. Hemoglobin A1C No results  for input(s): HGBA1C in the last 72 hours. Fasting Lipid Panel No results for input(s): CHOL, HDL, LDLCALC, TRIG, CHOLHDL, LDLDIRECT in the last 72 hours. Thyroid Function Tests No results for input(s): TSH, T4TOTAL, T3FREE, THYROIDAB in the last 72 hours.  Invalid input(s): FREET3  Other results:   Imaging    MR CARDIAC MORPHOLOGY W WO CONTRAST  Result Date: 10/21/2020 CLINICAL DATA:  Cardiomyopathy EXAM: CARDIAC MRI TECHNIQUE: The patient was scanned on a 1.5 Tesla Siemens magnet. A dedicated cardiac coil was used. Functional imaging was done using Fiesta sequences. 2,3, and 4 chamber views were done to assess for RWMA's. Modified Simpson's rule using a short axis stack was used to calculate an ejection fraction on a dedicated work [employer]. The  patient received 10 cc of Gadavist After 10 minutes inversion recovery sequences were used to assess for infiltration and scar tissue. CONTRAST:  Gadavist FINDINGS: Moderate LAE. Normal RA/RV. Severe LVE. Trivial posterior lateral pericardial effusion No ASD/PFO. Normal aortic root 3.5 cm There is severe LVE with moderate hypertrophy of the basal and mid septum 16 mm. There is diffuse hypokinesis with akinesis of the apex and distal septum The quantitative EF was 22% Delayed enhancement images showed poor nulling with distal septal and apical infarct There is a 1 cm x 1.4 cm apical thrombus with some mobility. The RV is mildly dilated with hypokinesis RVEF is moderately reduced at 31% (EDV 181 cc ESV 125 cc SV 55 cc) Parametric measures as follows T1: Global 1098 msec mildly elevated T2 49.5 msec normal range ECV: Elevated 39% IMPRESSION: 1. Severe LVE with global hypokinesis and septal / apical akinesis EF 22% 2. Delayed enhancement images show poor nulling of myocardium with distal septal and apical infarct 3.  1 cm x 1.4 cm mobile apical thrombus 4.  Mild RVE with moderate reduction in RVEF 31% 5. Combination of mid/basal hypertrophy poor nulling of myocardium, elevated ECV and mildly elevated T1 suggests possibility of infiltrative cardiomyopathy 6.  Trivial posterior lateral pericardial effusion Charlton Haws Electronically Signed   By: Charlton Haws M.D.   On: 10/21/2020 15:22   US Abdomen Limited RUQ (LIVER/GB)  Result Date: 10/21/2020 CLINICAL DATA:  Elevated transaminase level. EXAM: ULTRASOUND ABDOMEN LIMITED RIGHT UPPER QUADRANT COMPARISON:  None. FINDINGS: Gallbladder: No gallstones or wall thickening visualized. No sonographic Murphy sign noted by sonographer. Common bile duct: Diameter: 4 mm Liver: No focal lesion identified. Within normal limits in parenchymal echogenicity. Portal vein is patent on color Doppler imaging with normal direction of blood flow towards the liver. Other: None. IMPRESSION:  Negative right upper quadrant ultrasound. Electronically Signed   By: Sebastian Ache M.D.   On: 10/21/2020 09:43     Medications:     Scheduled Medications: . apixaban  5 mg Oral BID  . atorvastatin  40 mg Oral Daily  . carvedilol  6.25 mg Oral BID WC  . digoxin  0.125 mg Oral Daily  . folic acid  1 mg Oral Daily  . multivitamin with minerals  1 tablet Oral Daily  . sacubitril-valsartan  1 tablet Oral BID  . spironolactone  25 mg Oral Daily  . DAPA TIMI 68 dapagliflozin or placebo  1 tablet Oral Daily  . thiamine  100 mg Oral Daily   Or  . thiamine  100 mg Intravenous Daily    Infusions: . sodium chloride    . sodium chloride 10 mL/hr at 10/17/20 1237    PRN Medications: acetaminophen, ondansetron (ZOFRAN) IV,  polyethylene glycol     Assessment/Plan   1. Acute Systolic Heart Failure - admitted w/ NYHA Class III symptoms+ volume overload. BNP 2,900. CXR w/ cardiomegaly + pulmonary edema. HIV NR. TSH normal. Markedly hypertensive in ED. HS trop 113>>93. EKG sinus tach w/ PACs - Echo shows BiVentricular dysfunction. LVEF 20-25% w/ global HK, mild LVH and moderately reduced RV (no prior study for comparison) Cath with mild CAD and elevated filling pressures. - Volume status low over the weekend  - Continue carvedilol 6.25 mg twice a day.  - Continue entresto 97-103 mg twice a day  - Continue Spiro 25 mg daily   - Continue digoxin 0.125 mg. Dig level 0.5 - can lasix 40 daily (hold weight < 280) - In DAPA trial - No Bidil for now - Reviewed MRI with him. Doubt this is amyloid but will get PYP testing today prior to d/c (do not have to keep in house to await results). SPEP pending as well   2. LV Thrombus  - seen on echo,measuring 2.4 cm x 1.3 cm. In setting of severe LV dysfunction  - On Eliquis. No bleeding  3. Hypertension  - BP stable  4. Renal Insuffiencey - Scr 1.46 on admit - unsure if acute or chronic, baseline SCr unknown - Creatinine dropped to 1.14  but back up to 1.6 due to ATN. 1.4 today. Avoid hypotension  5. ETOH Abuse  - consumes 2-3 drinks of liquor/ day  - CIWA protocol per primary team  6. Hypokalemia - resolved, K 4.7   7. IDA - Iron sats low.  - Received feraheme 12/1  8. NSVT  -Life Vest ordered for D/C  -Continue coreg  9. CAD, mild non-obstructive - on statin. No ASA with Eliquis - LHC 10/17/20   Prox LAD to Mid LAD lesion is 30% stenosed.  Mid LAD lesion is 30% stenosed.  Dist LAD lesion is 30% stenosed.  1st Diag lesion is 70% stenosed. - No s/s angina   Length of Stay: 8  Arvilla Meres, MD  10/22/2020, 7:56 AM  Advanced Heart Failure Team Pager [phone] (M-F; 7a - 4p)  Please contact [facility] for night-coverage after hours (4p -7a ) and weekends on [email or web address]

## 2020-10-22 NOTE — Progress Notes (Signed)
Pt has rested comfortably throughout this shift. Notified by CCMD pt had short runs NSVT.   At 1953 4 bts, 0157 8bts and 0403 8bts. Pt asymptomatic with each short run. Strips saved. Dierdre Highman, RN

## 2020-10-22 NOTE — Progress Notes (Signed)
 Subjective:   Paul [name] states that he continues to feel very well today. He denies any SOB, fatigue, weakness, CP, palpitations, swelling, or other symptoms. He was noted to have short runs of non-sustained VT overnight, although was asymptomatic during these episodes. He states that he was unsure exactly what his imaging planned for today was for, although is hopeful that he is able to go home today with anticipated cardiology follow up out-patient.   Objective:  Vital signs in last 24 hours: Vitals:   10/21/20 1642 10/21/20 2041 10/22/20 0259 10/22/20 0800  BP: 135/79 128/84    Pulse: 90  96   Resp:  18  18  Temp:  98.4 F (36.9 C) 98.5 F (36.9 C)   TempSrc:  Oral Oral   SpO2:  97% 98% 99%  Weight:   129.9 kg   Height:       General: Patient is obese. He appears well. No acute distress. Respiratory: Lung sounds slightly diminished bilaterally although CTA without wheezing, rales, or rhonchi. No increased work of breathing on room air. No tachypnea. Cardiovascular: Rate is normal. Rhythm is regular. No murmurs, rubs, or gallops. No LE pitting edema.  Neurological: Patient is alert and oriented x 3.  Psych: Normal affect. Normal tone of voice.   Assessment/Plan:  Active Problems:   Acute heart failure (HCC)   OSA (obstructive sleep apnea)   Hypotension due to drugs  # Acute New-Onset Systolic Heart Failure Exacerbation, Likely Multifactorial  Initial BNP 2900. Patient has no known history of CHF but does have prolonged history of uncontrolled HTN. ECHO showed biventricular dysfunction with EF of 20-25%, global hypokinesis, mild LVH, LV thrombus 2.4x1.3cm and moderately decreased RV function. TSH, Lipids and A1c were unremarkable. R/LHC was performed, which showed mild nonobstructive CAD, elevated filling pressures, and preserved CO consistent with NICM. Likely multifactorial due to uncontrolled HTN, OSA, and possible infiltrative disease. cMRI today showed severe LVE w/ GH and  septal/apical akinesis with EF 22%, decrease in thrombus size to 1x1.4cm, mild RVE w/ moderate reduction in RVEF 31%, trivial posterior lateral pericardial effusion, and findings concerning for possible infiltrative cardiomyopathy. Patient did have elevated protein gap yesterday that normalized to 4 today. Consider amyloidosis, multiple myeloma, especially given concurrent anemia. Patient's blood pressures have been mildly elevated (improved since yesterday) without recurrence of hypotension since diuretics were held and gentle IVF's were given due to over-diuresis. Currently appears euvolemic. - Heart Failure team is on board; appreciate their recommendations - Discussed cMRI findings with patient and possible causes  - Patient will get PYP testing today prior to discharge  - Per heart failure team, plan is for discharge after PYP testing with outpatient follow up in cardiology clinic to discuss results; multiple myeloma panel also pending  - Continue Eliquis 5mg twice daily - Continue Carvedilol 6.25mg twice daily for now - Continue Entresto 97-103mg twice daily - Continue Spironolactone 25mg daily - Digoxin levels low at 0.5; consider increasing dose - Enrolled in DAPA trial (Farxiga)  - Holding Bidil given recent hypotension - Patient started on Lasix 40mg daily, holding for weight < 280lbs  - Continue telemetry monitoring  - Schedule outpatient visit for patient to follow up with [facility] December 13th @ 1:15pm - Continue Cardiac Rehab - Patient stable for discharge with LifeVest following PYP study if HDS.  # Likely OSA  Patient has characteristic symptoms of nocturnal awakenings, snoring and fatigue with obesity. STOP-BANG score 7, high risk for OSA; Initial venous gas showed pH   7.405, pCO2 54.7, bicarb 34.2. - SpO2 stable overnight  - Will need outpatient sleep study and likely nasal only CPAP  # Improving AKI # Acute Transaminitis, Stable Likely prerenal due to over-diuresis but may be  ATN from prolonged hypotension. Creatinine continues to improve s/p gentle fluids with holding diuretics. Transaminitis stable. RUQ ultrasound showed normal parenchymal echogenicity without biliary pathology. - Urine Urea pending for FENa calculation - Avoid nephrotoxic/hepatotoxic agents  - Will require outpatient follow up  # Prediabetes Patient has no known history of diabetes, although has hemoglobin A1c of 6.0 consistent with pre-diabetes.  - Patient would benefit from Metformin and lifestyle changes as outpatient  Prior to Admission Living Arrangement: Friend's home  Anticipated Discharge Location: Friend's home  Barriers to Discharge: cMRI, HDS  Speakman, Rachel, MD 10/22/2020, 11:42 AM Pager: [phone] After 5pm on weekdays and 1pm on weekends: On Call pager [phone]  

## 2020-10-22 NOTE — Telephone Encounter (Signed)
Patient is currently uninsured, started an application for Eliquis and Entresto assistance.  Will fax in once signatures are obtained.

## 2020-10-22 NOTE — Plan of Care (Signed)
  Problem: Clinical Measurements: Goal: Diagnostic test results will improve Outcome: Progressing Goal: Cardiovascular complication will be avoided Outcome: Progressing   Problem: Clinical Measurements: Goal: Respiratory complications will improve Outcome: Completed/Met   Problem: Nutrition: Goal: Adequate nutrition will be maintained Outcome: Completed/Met   Problem: Coping: Goal: Level of anxiety will decrease Outcome: Completed/Met   Problem: Elimination: Goal: Will not experience complications related to bowel motility Outcome: Completed/Met Goal: Will not experience complications related to urinary retention Outcome: Completed/Met   Problem: Pain Managment: Goal: General experience of comfort will improve Outcome: Completed/Met

## 2020-10-23 ENCOUNTER — Telehealth (HOSPITAL_COMMUNITY): Payer: Self-pay

## 2020-10-23 NOTE — Telephone Encounter (Signed)
Attempted to call patient in regards to Cardiac Rehab - LM on VM 

## 2020-10-24 ENCOUNTER — Encounter: Payer: Self-pay | Admitting: *Deleted

## 2020-10-24 ENCOUNTER — Other Ambulatory Visit: Payer: Self-pay

## 2020-10-24 DIAGNOSIS — Z006 Encounter for examination for normal comparison and control in clinical research program: Secondary | ICD-10-CM

## 2020-10-24 NOTE — Research (Signed)
   Week 1 Visit Worksheet  PATIENT ID: [id number]   DATE OF CONTACT: December 9th, 2021   Type of Visit*:   *Visit should be done in person whenever possible  [x]  Clinic Visit                         []  Telephone Contact with Patient     []  Family Member or Caregiver  []  Primary Care Physician   []  Medical Record Review                    []  Other (specify)____________  1. Did patient experience any AEs leading to study drug discontinuation, SAEs related to study drug, protocol-defined unexpected/unanticipated events, clinical endpoints (death, worsening HF), or adverse events of special interest since the last visit?  [x]     Yes*  []     No  *If yes, please record in [employer] EDC (eCRF)  2.   HF Signs and Symptoms Assessed by: , RN  3.     Vital Signs  Pulse: 96 bmp   Height 75 []  cm  [x]   in (xxx.xx)  B/P: 139/87 mmHg  Body Weight 280.4 []  kg   [x]   lbs  (xxx.xx)   4.     Concomitant medications Enter current heart failure and diabetes medications in the eCRF   5.    Is Patient currently taking study drug?  [x]  Yes    or   []  No*     *If no, describe reason:    6.   Date last tablet taken:  December 9th, 2021  Did patient bring bottles with them to visit? [x]  Yes []  No  Bottle number 1:  [id number]   Opened? [x]  Y   []  N Number of tablets remaining 27 Bottle number 2:  [id number]   Opened? []  Y    [x]  N Number of tablets remaining 35   How many days did patient NOT take the study drug since last visit?  0 Days  Investigator judgment of Compliance: [x]  Reasonable (? 20% deviation from expected)        []  Questionable (>20% deviation from expected)           1. 7.  Local Labs Performed Creatinine   mg/dl  Date: Potassium   mmol/L Date: 10/24/20   8.   Other Items  [x]  Schedule Month 1 Follow Up Visit : Monday, January 3rd, 2022 @ 1pm  []  Schedule Month 2 Follow Up Visit __________________________  [x]  Remind patient of importance of complying with study  medication (1 pill per day)  [x]  Remind patient to bring both study medication bottles (even if empty) to next clinic visit  [x]  Review Patient Contact Information Sheet, update if necessary   DAPA ACT HF-TIMI 68 Week 1 Visit Worksheet       Version 2.0 Date 20 Jun 2020

## 2020-10-24 NOTE — Discharge Summary (Signed)
Name: Paul [name] MRN: [id number] DOB: 03/25/1961 59 y.o. PCP: Patient, No Pcp Per  Date of Admission: 10/14/2020  7:16 AM Date of Discharge: 10/22/2020 Attending Physician: Dr. Velna Ochs  Discharge Diagnosis:  Active Problems:   Acute heart failure (HCC)   OSA (obstructive sleep apnea)   Hypotension due to drugs   Prediabetes  1. New-Onset Systolic Heart Failure Exacerbation  2. Severe LVH 3. LV Apical Thrombus  4. NICM 5. Nonobstructive CAD 6. Increased Gamma Gap  7. Non-sustained VT 8. Likely OSA  9. AKI  10. Acute Liver Injury 11. Pre-diabetes  12. Hypertension  13. Iron Deficiency  14. Microcytic Anemia   Discharge Medications: Allergies as of 10/22/2020      Reactions   Shellfish Allergy Shortness Of Breath, Swelling   Throat swelling      Medication List    STOP taking these medications   Alka-Seltzer Pls Night Cld/Flu 5-6.25-10-325 MG Caps Generic drug: Phenyleph-Doxylamine-DM-APAP   CORICIDIN HBP PO   ZINC PO     TAKE these medications   apixaban 5 MG Tabs tablet Commonly known as: ELIQUIS Take 1 tablet (5 mg total) by mouth 2 (two) times daily.   atorvastatin 40 MG tablet Commonly known as: Lipitor Take 1 tablet (40 mg total) by mouth daily.   carvedilol 6.25 MG tablet Commonly known as: COREG Take 1 tablet (6.25 mg total) by mouth 2 (two) times daily with a meal.   DAPA TIMI 68 dapagliflozin or placebo 10 mg Tabs tablet Take 1 tablet by mouth daily.   digoxin 0.125 MG tablet Commonly known as: LANOXIN Take 1 tablet (0.125 mg total) by mouth daily.   folic acid 1 MG tablet Commonly known as: FOLVITE Take 1 tablet (1 mg total) by mouth daily.   furosemide 40 MG tablet Commonly known as: LASIX Take 1 tablet (40 mg total) by mouth daily.   multivitamin with minerals Tabs tablet Take 1 tablet by mouth daily.   sacubitril-valsartan 97-103 MG Commonly known as: ENTRESTO Take 1 tablet by mouth 2 (two) times daily.    sodium chloride 0.65 % Soln nasal spray Commonly known as: OCEAN Place 1 spray into both nostrils as needed for congestion.   spironolactone 25 MG tablet Commonly known as: ALDACTONE Take 1 tablet (25 mg total) by mouth daily.   thiamine 100 MG tablet Take 1 tablet (100 mg total) by mouth daily.       Disposition and follow-up:   Paul [name] was discharged from [facility] in Stable condition.  At the hospital follow up visit please address:  1. New-Onset Systolic Heart Failure Exacerbation  2. Severe LVH 3. LV Apical Thrombus  4. NICM 5. Nonobstructive CAD 6. Increased Gamma Gap  7. Non-sustained VT 8. Likely OSA  9. AKI  10. Acute Liver Injury 11. Pre-diabetes  12. Hypertension  13. Iron Deficiency  14. Microcytic Anemia   1.   A. New-Onset HFrEF ~ 22% - Please be sure patient appears euvolemic and is HDS on Carvedilol, Entresto, Spironolactone, Digoxin, Farxiga (DAPA trial), and Lasix. Consider dose adjustments as necessary. Bidil (Hydralazine, Imdur) were held at discharge as blood pressures became very soft and patient became symptomatic on these medications. Please follow up PYP study and multiple myeloma studies to assess whether NICM may be due to infiltrative process.   B. LV Apical Thrombus - Be sure patient continues to take Eliquis 49m twice daily.    C. Non-sustained VT - Be sure patient continues to  wear LifeVest and assess for shocks.    D. Likely OSA - Patient endorses PND, snoring, daytime fatigue. Did not tolerate full face mask well during admission. Would benefit from sleep study.    E. Pre-Diabetes - Hb A1c here 6.0. Patient denies history of DM. Please counsel on lifestyle modifications (cardiac rehabilitation with gradual exercise, avoidance of alcohol given hx of abuse) and Metformin.    F. AKI, Acute Liver Injury - Follow for resolution with CMP   G. Microcytic Anemia with IDA - Patient is s/p iron transfusion in the  hospital. Repeat CBC and iron studies and consider additional replacement as needed. Patient would benefit from colonoscopy.   2.  Labs / imaging needed at time of follow-up: CBC, CMP, sleep study, consider Digoxin levels and eventual Iron studies.  3.  Pending labs/ test needing follow-up: PYP Scan, Multiple Myeloma Panel.   Follow-up Appointments:  Follow-up Information    Seawell, Jaimie A, DO .   Specialty: Internal Medicine Contact information: [address] [place] [address] [facility] Follow up on 10/30/2020.   Specialty: Cardiology Why: at 9:20 [place] at [employer].  Contact information: [address] [id number] [place] [facility] Course by problem list:  1. Acute New-Onset Systolic Heart Failure Exacerbation Initial BNP 2900. Patient has no known history of CHF but does have prolonged history of uncontrolled HTN and likely OSA. ECHO showed biventricular dysfunction with EF of 20-25%, global hypokinesis, mild LVH, LV thrombus 2.4x1.3cm and moderately decreased RV function. TSH, Lipids and A1c were unremarkable. R/LHC was performed, which showed mild nonobstructive CAD, elevated filling pressures, and preserved CO consistent with NICM. Likely multifactorial due to uncontrolled HTN, OSA, and possible infiltrative disease. cMRI today showed severe LVE w/ GH and septal/apical akinesis with EF 22%, decrease in thrombus size to 1x1.4cm, mild RVE w/ moderate reduction in RVEF 31%, trivial posterior lateral pericardial effusion, and findings concerning for possible infiltrative cardiomyopathy. Patient did have elevated protein gap yesterday that normalized to 4. PYP study was ordered to rule out cardiac amyloidosis and Multiple Myeloma panel was ordered to rule out infiltrative disease process. Hemochromatosis unlikely  as patient was iron deficiency, sarcoidosis less likely given no specific findings on CXR. HF team was consulted early on and followed throughout patient's length of stay. Although he was overdiuresed and developed symptomatic hypotension and NSVT, symptoms resolved after gentle fluids and patient was discharged in stable condition on Eliquis 67m BID, Carvedilol 6.261mBID, Entresto 97-10367mID, spironolactone 39m61mily, and possible Farxiga (DAPA trial). Patient was instructed to take Lasix 40mg64mly unless weight < 280lbs (patient given a scale). Patient was informed not to take Bidil (Hydralazine, Imdur) as these likely contributed to symptomatic hypotension in the hospital. Received Cardiac Rehabilitation and LifeVest due to NSVT and [facility]   2. Likely OSA  Patient had characteristic symptoms of nocturnal awakenings, snoring and fatigue with obesity. STOP-BANG score 7, high risk for OSA; Initial venous gas showed pH 7.405, pCO2 54.7, bicarb 34.2. Did have hypoxic episodes overnight and did not tolerate wearing CPAP mask at night. Will need outpatient sleep study.   3. AKI Creatinine and GFR normalized early in stay with diuresis; however, after over-diuresis, patient developed AKI, due to prerenal  injury from over-diuresis vs. ATN in the setting of prolonged hypotension. Renal function continued to improve after gentle fluids prior to discharge.   4. Elevated LFT's Patient was found to have isolated mildly elevated LFT's. RUQ ultrasound showed normal parenchymal echogenicity without biliary pathology. Cause for elevation in LFT's was not concisely delineated and will require outpatient follow up.   5. Prediabetes  Patient has no known history of diabetes, although had hemoglobin A1c of 6.0 consistent with pre-diabetes. Patient informed of new diagnosis.  6. Iron Deficiency, Microcytic Anemia Hemoglobin was consistently low ~11-12, with MCV  as low as 74.0. Iron studies showed Iron 22, ferritin 25, saturation ratios 6, TIBC 392, consistent with IDA. He was given feraheme x 1.   7. Alcohol Abuse Patient endorses drinking > 2 alcoholic beverages per night but denies hx of withdrawal. May be contributing to heart disease. Was placed on CIWA without Ativan and was started on Folate and Thiamine during admission.   Discharge Vitals:   BP (!) 129/93   Pulse 88   Temp 97.8 F (36.6 C) (Oral)   Resp 18   Ht '6\' 3"'  (1.905 m)   Wt 129.9 kg   SpO2 99%   BMI 35.79 kg/m   Pertinent Labs, Studies, and Procedures:   See above for labs.  CXR 10/14/20: FINDINGS: Pulmonary vascular congestion. Enlargement of the main pulmonary artery suggesting pulmonary arterial hypertension. Bilateral hilar prominence is likely related to this. No pleural effusion or pneumothorax. Cardiomegaly. No acute osseous abnormality  IMPRESSION: Pulmonary vascular congestion and cardiomegaly.  Likely pulmonary arterial hypertension.  RUQ Ultrasound 10/21/20: Negative  MR Cardiac Morphology w/wo Contrast 10/21/20:  FINDINGS: Moderate LAE. Normal RA/RV. Severe LVE. Trivial posterior lateral pericardial effusion No ASD/PFO. Normal aortic root 3.5 cm There is severe LVE with moderate hypertrophy of the basal and mid septum 16 mm. There is diffuse hypokinesis with akinesis of the apex and distal septum The quantitative EF was 22% Delayed enhancement images showed poor nulling with distal septal and apical infarct There is a 1 cm x 1.4 cm apical thrombus with some mobility. The RV is mildly dilated with hypokinesis RVEF is moderately reduced at 31% (EDV 181 cc ESV 125 cc SV 55 cc)  Parametric measures as follows T1: Global 1098 msec mildly elevated T2 49.5 msec normal range ECV: Elevated 39%  IMPRESSION: 1. Severe LVE with global hypokinesis and septal / apical akinesis EF 22% 2. Delayed enhancement images show poor nulling of myocardium  with distal septal and apical infarct 3.  1 cm x 1.4 cm mobile apical thrombus 4.  Mild RVE with moderate reduction in RVEF 31% 5. Combination of mid/basal hypertrophy poor nulling of myocardium, elevated ECV and mildly elevated T1 suggests possibility of infiltrative cardiomyopathy 6.  Trivial posterior lateral pericardial effusion  NM Cardiac Amyloid Tumor Spect:  IMPRESSION: Visual and quantitative assessment (grade 1, H/CLL = 1.19 are equivocal for transthyretin amyloidosis.   Discharge Instructions: Discharge Instructions    (HEART FAILURE PATIENTS) Call MD:  Anytime you have any of the following symptoms: 1) 3 pound weight gain in 24 hours or 5 pounds in 1 week 2) shortness of breath, with or without a dry hacking cough 3) swelling in the hands, feet or stomach 4) if you have to sleep on extra pillows at night in order to breathe.   Complete by: As directed    Amb Referral to Cardiac Rehabilitation   Complete by: As directed    Diagnosis: Heart Failure (see criteria  below if ordering Phase II)   Heart Failure Type: Chronic Systolic   After initial evaluation and assessments completed: Virtual Based Care may be provided alone or in conjunction with Phase 2 Cardiac Rehab based on patient barriers.: Yes   Avoid straining   Complete by: As directed    Call MD for:  difficulty breathing, headache or visual disturbances   Complete by: As directed    Call MD for:  extreme fatigue   Complete by: As directed    Call MD for:  persistant dizziness or light-headedness   Complete by: As directed    Call MD for:  severe uncontrolled pain   Complete by: As directed    Diet - low sodium heart healthy   Complete by: As directed    Diet Carb Modified   Complete by: As directed    Discharge instructions   Complete by: As directed    Paul [name],   Your shortness of breath with exertion and your weakness/fatigue were found to be due to excess fluid. You were diagnosed with congestive heart  failure (CHF) and your heart was found not to pump as well as it should. You have a blood clot in your heart that has greatly improved since your admission, for which you were started on Eliquis (a blood thinner), and you were started on many medications by the heart failure team that can greatly decrease not only the symptoms you experience, but also improve your heart and kidneys over time. Your heart failure was not due to ischemia (clot in the heart vessels such as in MI). A PYP study was done and labs were collected to rule out multiple myeloma or cardiac amyloidosis, although these studies are pending for review at your follow up appointments.   This hospitalization, you were also found to have high blood sugar levels, consistent with pre-diabetes. Life style modifications such as continued weight loss and a diet low in simple sugars such as pastas, cereals, rice, and sugary sweets and sodas/juices can greatly improve your sugars so that you do not develop diabetes. There are also medications that can be prescribed at your outpatient follow up visits.   You likely have Obstructive Sleep Apnea with snoring at night and fatigue during the day. You would benefit from a sleep study performed outpatient with fitting for a CPAP machine to wear at home. There are different types of masks and one may be more comfortable for you.  Please see the list of medications attached for instructions on how to take your medications.  It is very important that you take all medications as prescribed, paying special attention to the following:  Lasix - take 1 tablet per day UNLESS your weight is <280lbs. If your weight is low, do not take this medication to avoid low blood pressure like you experienced in the hospital. You should weight yourself daily.   Be sure to take your Eliquis 1 tablet in the morning, 1 in the evening, to help dissolve the clot in your heart.   You also have a LifeVest ordered that will shock you  into a normal rhythm if your heart were to beat abnormally after you leave the hospital.   Take all other medications as prescribed, and be sure to follow up as follows: - 12/9 Appointment with [facility] at [place]  - 12/13 Appointment with Dr. Sharon Seller at 1:15pm in [facility] ([facility] of [facility])  - 12/15 Appointment with Dr. Haroldine Laws at the [facility]  [place]   Please be sure to call to reschedule if you find you are unable to make the above appointments.  If you experience worsening fatigue, weakness, light-headedness, dizziness, weight gain, CP or palpitations among other concerns, please return to the ED for evaluation.   I am so glad that you are feeling better and are off on the right path! It was a pleasure participating in your care and we hope you stay well.   Dr. Konrad Penta   Heart Failure patients record your daily weight using the same scale at the same time of day   Complete by: As directed    Increase activity slowly   Complete by: As directed    STOP any activity that causes chest pain, shortness of breath, dizziness, sweating, or exessive weakness   Complete by: As directed       Signed: Jeralyn Bennett, MD 10/24/2020, 9:27 PM   Pager: [phone]

## 2020-10-25 LAB — BASIC METABOLIC PANEL
BUN/Creatinine Ratio: 23 (ref 10–24)
BUN: 27 mg/dL (ref 8–27)
CO2: 22 mmol/L (ref 20–29)
Calcium: 9.1 mg/dL (ref 8.6–10.2)
Chloride: 97 mmol/L (ref 96–106)
Creatinine, Ser: 1.2 mg/dL (ref 0.76–1.27)
GFR calc Af Amer: 76 mL/min/{1.73_m2} (ref >59)
GFR calc non Af Amer: 65 mL/min/{1.73_m2} (ref >59)
Glucose: 93 mg/dL (ref 65–99)
Potassium: 4.7 mmol/L (ref 3.5–5.2)
Sodium: 134 mmol/L (ref 134–144)

## 2020-10-28 ENCOUNTER — Other Ambulatory Visit: Payer: Self-pay

## 2020-10-28 ENCOUNTER — Ambulatory Visit (INDEPENDENT_AMBULATORY_CARE_PROVIDER_SITE_OTHER): Payer: Medicaid Other | Admitting: Internal Medicine

## 2020-10-28 ENCOUNTER — Encounter: Payer: Self-pay | Admitting: Internal Medicine

## 2020-10-28 VITALS — BP 110/74 | HR 91 | Temp 98.2°F | Ht 75.0 in | Wt 287.0 lb

## 2020-10-28 DIAGNOSIS — N1831 Chronic kidney disease, stage 3a: Secondary | ICD-10-CM | POA: Insufficient documentation

## 2020-10-28 DIAGNOSIS — Z1211 Encounter for screening for malignant neoplasm of colon: Secondary | ICD-10-CM

## 2020-10-28 DIAGNOSIS — Z9189 Other specified personal risk factors, not elsewhere classified: Secondary | ICD-10-CM

## 2020-10-28 DIAGNOSIS — Z1159 Encounter for screening for other viral diseases: Secondary | ICD-10-CM

## 2020-10-28 DIAGNOSIS — N1832 Chronic kidney disease, stage 3b: Secondary | ICD-10-CM | POA: Insufficient documentation

## 2020-10-28 DIAGNOSIS — I513 Intracardiac thrombosis, not elsewhere classified: Secondary | ICD-10-CM | POA: Diagnosis not present

## 2020-10-28 DIAGNOSIS — I10 Essential (primary) hypertension: Secondary | ICD-10-CM

## 2020-10-28 DIAGNOSIS — I2510 Atherosclerotic heart disease of native coronary artery without angina pectoris: Secondary | ICD-10-CM | POA: Diagnosis not present

## 2020-10-28 DIAGNOSIS — I472 Ventricular tachycardia: Secondary | ICD-10-CM

## 2020-10-28 DIAGNOSIS — R7303 Prediabetes: Secondary | ICD-10-CM

## 2020-10-28 DIAGNOSIS — N179 Acute kidney failure, unspecified: Secondary | ICD-10-CM | POA: Diagnosis not present

## 2020-10-28 DIAGNOSIS — D509 Iron deficiency anemia, unspecified: Secondary | ICD-10-CM | POA: Diagnosis not present

## 2020-10-28 DIAGNOSIS — I4729 Other ventricular tachycardia: Secondary | ICD-10-CM

## 2020-10-28 DIAGNOSIS — I5042 Chronic combined systolic (congestive) and diastolic (congestive) heart failure: Secondary | ICD-10-CM | POA: Diagnosis not present

## 2020-10-28 DIAGNOSIS — Z0000 Encounter for general adult medical examination without abnormal findings: Secondary | ICD-10-CM

## 2020-10-28 DIAGNOSIS — I5082 Biventricular heart failure: Secondary | ICD-10-CM

## 2020-10-28 DIAGNOSIS — G4733 Obstructive sleep apnea (adult) (pediatric): Secondary | ICD-10-CM | POA: Diagnosis not present

## 2020-10-28 MED ORDER — CARVEDILOL 12.5 MG PO TABS: 12.5000 mg | ORAL_TABLET | Freq: Two times a day (BID) | ORAL | 5 refills | Status: DC

## 2020-10-28 NOTE — Assessment & Plan Note (Addendum)
Admitted 11/29-12/7 for new onset systolic heart failure with biventricular dysfunction, LVEF 20-25% and moderately reduced RV. Underwent L&RHC which showed mild CAD. cardiac MRI showed 1x1.4cm apical thrombus, distal septal & apical infarct; RVEF 31% and possible infiltrative cardiomyopathy.  Tc-PYP scan was done which showed findings equivocal for transthyretin amyloidosis. MM panel with polyclonal gammopathy, no M protein, no proteinuria on UA.   Started on eliquis, entresto 97-103 bid, spironolactone 25 mg qd, digoxin, DAPA-TIMI 68 study (farxiga 10 mg or placebo) and lasix 40 mg qd.  He has been feeling great since discharge. No chest pain, shortness of breath, orthopnea, lower extremity swelling. He has been urinating frequently with the lasix. He has been eating a very low salt diet with his partner, they are experimenting with alternative spices and finding the diet not as difficult as they expected.  HR 91, BP 110/74. No JVP or edema, lungs clear.   - increase coreg to 12.5 mg bid  - cont. Other above medications - will f/u with HF clinic in two days  - CMP, Mg today  - no prior FLC, added today - f/u in one month for labs

## 2020-10-28 NOTE — Assessment & Plan Note (Signed)
A1c 6. He and his partner are adhering to a very low sodium, low sugar diet. Discussed foods that could increase a1c.   - f/u in 3 months for a1c check

## 2020-10-28 NOTE — Assessment & Plan Note (Signed)
STOP-BANG 5. Recently admitted for new onset heart failure with CAD.    - sleep study ordered

## 2020-10-28 NOTE — Assessment & Plan Note (Signed)
Initially had AKI secondary to volume overload during recent admission, which resolved. Experienced hypotension 2/2 diuresis with development of AKI 2/2 ATN. Was improving prior to discharge.   - recheck CMP today

## 2020-10-28 NOTE — Assessment & Plan Note (Signed)
1x1.3 mobile appearing LV thrombus on cardiac MRI. Started on eliquis 5 mg bid at discharge.   - continue eliquis 5 mg bid

## 2020-10-28 NOTE — Assessment & Plan Note (Signed)
Received feraheme during admission 12/1. Last colonoscopy 2011. Started on apixaban during recent admission for LV thrombus.   - CBC today  - colonoscopy referral placed

## 2020-10-28 NOTE — Patient Instructions (Signed)
Thank you for allowing [place] to provide your care today. Today we discussed your new heart failure and possible obstructive sleep apnea   I have ordered the following labs for you:  Basic metabolic panel, magnesium, complete blood count   I will call if any are abnormal.    Today we made the following changes to your medications:   Please START taking  Coreg 12.5 mg - take 1 tablet two times per day.   Please follow-up in one month for blood pressure check and labs.    I have sent a referral for screening colonoscopy and sleep study. They will call you to make an appointment.   Please call the internal medicine center clinic if you have any questions or concerns, we may be able to help and keep you from a long and expensive emergency room wait. Our clinic and after hours phone number is [phone], the best time to call is Monday through Friday 9 am to 4 pm but there is always someone available 24/7 if you have an emergency. If you need medication refills please notify your pharmacy one week in advance and they will send [place] a request.

## 2020-10-28 NOTE — Assessment & Plan Note (Signed)
-   Has had covid vaccine and boosters - will bring vaccination cards at f/u  - colonoscopy referral

## 2020-10-28 NOTE — Assessment & Plan Note (Signed)
During recent hospital admission. Discharged with LifeVest.   - discussed that he should refrain from driving until he is cleared from wearing the Lifevest.

## 2020-10-28 NOTE — Progress Notes (Signed)
   CC: heart failure  HPI:  [name] is a 59 y.o. with PMH as below.   Please see A&P for assessment of the patient's acute and chronic medical conditions.   Social History:  Currently living with friend of 12 years. 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 CHF (congestive heart failure) (HCC)   . Dyspnea   . History of hip replacement   . Hypertension    Review of Systems:   Review of Systems  Constitutional: Negative for chills, diaphoresis, malaise/fatigue and weight loss.  Respiratory: Negative for cough and shortness of breath.   Cardiovascular: Negative for chest pain, palpitations, orthopnea, leg swelling and PND.  Gastrointestinal: Negative for abdominal pain, constipation, diarrhea, melena and nausea.  Genitourinary: Positive for frequency. Negative for dysuria and urgency.  Neurological: Negative for dizziness, tingling and sensory change.   Physical Exam: Constitution: NAD, appears stated age, wearing lifevest monitor Cardio: RRR, no m/r/g, no LE edema; no JVP Respiratory: CTA, no w/r/r Abdominal: NTTP, soft, non-distended MSK: moving all extremities Neuro: normal affect, a&ox3, pleasant Skin: c/d/i    Vitals:   10/28/20 1329  BP: 110/74  Pulse: 91  Temp: 98.2 F (36.8 C)  TempSrc: Oral  SpO2: 99%  Weight: 287 lb (130.2 kg)  Height: 6\' 3"  (1.905 m)     Assessment & Plan:   See Encounters Tab for problem based charting.  Patient discussed with Dr. 

## 2020-10-29 ENCOUNTER — Other Ambulatory Visit: Payer: Self-pay | Admitting: Internal Medicine

## 2020-10-29 ENCOUNTER — Other Ambulatory Visit (INDEPENDENT_AMBULATORY_CARE_PROVIDER_SITE_OTHER): Payer: Medicaid Other

## 2020-10-29 DIAGNOSIS — E875 Hyperkalemia: Secondary | ICD-10-CM | POA: Diagnosis not present

## 2020-10-29 LAB — CMP14 + ANION GAP
ALT: 56 IU/L — ABNORMAL HIGH (ref 0–44)
AST: 32 IU/L (ref 0–40)
Albumin/Globulin Ratio: 1 — ABNORMAL LOW (ref 1.2–2.2)
Albumin: 4 g/dL (ref 3.8–4.9)
Alkaline Phosphatase: 99 IU/L (ref 44–121)
Anion Gap: 14 mmol/L (ref 10.0–18.0)
BUN/Creatinine Ratio: 19 (ref 10–24)
BUN: 28 mg/dL — ABNORMAL HIGH (ref 8–27)
Bilirubin Total: 0.3 mg/dL (ref 0.0–1.2)
CO2: 25 mmol/L (ref 20–29)
Calcium: 9.3 mg/dL (ref 8.6–10.2)
Chloride: 98 mmol/L (ref 96–106)
Creatinine, Ser: 1.44 mg/dL — ABNORMAL HIGH (ref 0.76–1.27)
GFR calc Af Amer: 61 mL/min/{1.73_m2} (ref >59)
GFR calc non Af Amer: 52 mL/min/{1.73_m2} — ABNORMAL LOW (ref >59)
Globulin, Total: 3.9 g/dL (ref 1.5–4.5)
Glucose: 91 mg/dL (ref 65–99)
Potassium: 5.7 mmol/L — ABNORMAL HIGH (ref 3.5–5.2)
Sodium: 137 mmol/L (ref 134–144)
Total Protein: 7.9 g/dL (ref 6.0–8.5)

## 2020-10-29 LAB — BASIC METABOLIC PANEL
Anion gap: 10 (ref 5–15)
BUN: 26 mg/dL — ABNORMAL HIGH (ref 6–20)
CO2: 25 mmol/L (ref 22–32)
Calcium: 9.2 mg/dL (ref 8.9–10.3)
Chloride: 100 mmol/L (ref 98–111)
Creatinine, Ser: 1.27 mg/dL — ABNORMAL HIGH (ref 0.61–1.24)
GFR, Estimated: >60 mL/min (ref >60)
Glucose, Bld: 93 mg/dL (ref 70–99)
Potassium: 4.5 mmol/L (ref 3.5–5.1)
Sodium: 135 mmol/L (ref 135–145)

## 2020-10-29 LAB — HEPATITIS C ANTIBODY: Hep C Virus Ab: <0.1 s/co ratio (ref 0.0–0.9)

## 2020-10-29 LAB — CBC
Hematocrit: 43.7 % (ref 37.5–51.0)
Hemoglobin: 13.8 g/dL (ref 13.0–17.7)
MCH: 23.8 pg — ABNORMAL LOW (ref 26.6–33.0)
MCHC: 31.6 g/dL (ref 31.5–35.7)
MCV: 76 fL — ABNORMAL LOW (ref 79–97)
Platelets: 277 10*3/uL (ref 150–450)
RBC: 5.79 x10E6/uL (ref 4.14–5.80)
RDW: 16.8 % — ABNORMAL HIGH (ref 11.6–15.4)
WBC: 4.4 10*3/uL (ref 3.4–10.8)

## 2020-10-29 LAB — KAPPA/LAMBDA LIGHT CHAINS
Ig Kappa Free Light Chain: 60.8 mg/L — ABNORMAL HIGH (ref 3.3–19.4)
Ig Lambda Free Light Chain: 21.5 mg/L (ref 5.7–26.3)
KAPPA/LAMBDA RATIO: 2.83 — ABNORMAL HIGH (ref 0.26–1.65)

## 2020-10-29 LAB — MAGNESIUM: Magnesium: 2.2 mg/dL (ref 1.6–2.3)

## 2020-10-29 NOTE — Progress Notes (Signed)
ADVANCED HEART FAILURE CLINIC NOTE  PCP: Steffanie Rainwater, MD Primary Cardiologist: DB  HPI:  Paul [name] is a 59 y/o male with obesity, HTN and ETOH use. Admitted in 11/21 with new onset HF.   Echo biventricular heart failure. LVEF 20-25% w/ global hypokinesis, mild LVH. RV systolic function moderately reduced. There is an LV apical thrombus measuring 2.4 cm x 1.3 cm.  - cMRI Severe LVE EF 22%. 1 cm x 1.4 cm mobile apical thrombus. Mild RVE with moderate reduction in RVEF 31%. Combination of mid/basal hypertrophy poor nulling of myocardium, elevated ECV and mildly elevated T1 suggests possibility of infiltrative cardiomyopathy.   - PYP 12/21 negative  - LHC 10/17/20   Prox LAD to Mid LAD lesion is 30% stenosed.  Mid LAD lesion is 30% stenosed.  Dist LAD lesion is 30% stenosed. Apical LAD with diffuse disease  1st Diag lesion is 70% stenosed  Developed NSVT and LifeVest placed.  Here today for follow up. Feeling good, no SOB. Able to walk around the block with no SOB. Says it is the best he has felt in a long time. No chest pain, edema, palpitations, dizziness, PND/orthopnea. Weights at home ~279 lbs. BPs at home averaging 130-140s/80-90. No bleeding issues. Wearing LifeVest and taking all medications.    ROS: All systems negative except as listed in HPI, PMH and Problem List.  SH:  Social History   Socioeconomic History  . Marital status: Divorced    Spouse name: Not on file  . Number of children: Not on file  . Years of education: Not on file  . Highest education level: Not on file  Occupational History  . Not on file  Tobacco Use  . Smoking status: Never Smoker  . Smokeless tobacco: Never Used  Vaping Use  . Vaping Use: Never used  Substance and Sexual Activity  . Alcohol use: Yes  . Drug use: Never  . Sexual activity: Not on file  Other Topics Concern  . Not on file  Social History Narrative  . Not on file   Social Determinants of Health    Financial Resource Strain: Not on file  Food Insecurity: Not on file  Transportation Needs: Not on file  Physical Activity: Not on file  Stress: Not on file  Social Connections: Not on file  Intimate Partner Violence: Not on file    FH:  Family History  Problem Relation Age of Onset  . Pulmonary Hypertension Mother   . Hypertension Mother   . CAD Father        s/p CABG x 4  . Heart failure Father   . Heart failure Sister   . Hypertension Sister     Past Medical History:  Diagnosis Date  . CHF (congestive heart failure) (HCC)   . Dyspnea   . History of hip replacement   . Hypertension     Current Outpatient Medications  Medication Sig Dispense Refill  . apixaban (ELIQUIS) 5 MG TABS tablet Take 1 tablet (5 mg total) by mouth 2 (two) times daily. 60 tablet 11  . atorvastatin (LIPITOR) 40 MG tablet Take 1 tablet (40 mg total) by mouth daily. 30 tablet 11  . digoxin (LANOXIN) 0.125 MG tablet Take 1 tablet (0.125 mg total) by mouth daily. 30 tablet 11  . folic acid (FOLVITE) 1 MG tablet Take 1 tablet (1 mg total) by mouth daily. 30 tablet 0  . furosemide (LASIX) 40 MG tablet Take 1 tablet (40 mg total) by mouth  daily. 30 tablet 0  . sacubitril-valsartan (ENTRESTO) 97-103 MG Take 1 tablet by mouth 2 (two) times daily. 60 tablet 11  . spironolactone (ALDACTONE) 25 MG tablet Take 1 tablet (25 mg total) by mouth daily. 30 tablet 11  . Study - DAPA TIMI 68 - dapagliflozin (FARXIGA) 10 mg or placebo tablet (PI-Dalton McLean) Take 1 tablet by mouth daily. 30 tablet 0  . thiamine 100 MG tablet Take 1 tablet (100 mg total) by mouth daily. 30 tablet 0  . carvedilol (COREG) 12.5 MG tablet Take 1 tablet (12.5 mg total) by mouth 2 (two) times daily with a meal. 60 tablet 3   No current facility-administered medications for this encounter.    Vitals:   10/30/20 0931  BP: 124/88  Pulse: 89  SpO2: 98%  Weight: 130 kg (286 lb 9.6 oz)    PHYSICAL EXAM:  General:  Well appearing. No  resp difficulty HEENT: normal Neck: supple. JVP flat. Carotids 2+ bilaterally; no bruits. No lymphadenopathy or thryomegaly appreciated. Cor: PMI normal. Regular rate & rhythm. No rubs, gallops or murmurs. Lungs: clear Abdomen: obese soft, nontender, nondistended. No hepatosplenomegaly. No bruits or masses. Good bowel sounds. Extremities: no cyanosis, clubbing, rash, trace RLE edema Neuro: alert & orientedx3, cranial nerves grossly intact. Moves all 4 extremities w/o difficulty. Affect pleasant.   ECG: NSR 91 bpm, RBBB (personally reviewed)   ASSESSMENT & PLAN:  1. Chronic Systolic Heart Failure - Admitted 11/21 with acute systolic HF. Markedly hypertensive in ED. HS trop 113>>93. EKG sinus tach w/ PACs - Echo 11/21 LVEF 20-25% w/ global HK, mild LVH and moderately reduced RV (no prior study for comparison) - cMRI Severe LVE EF 22%. 1 cm x 1.4 cm mobile apical thrombus. Mild RVE with moderate reduction in RVEF 31%. Combination of mid/basal hypertrophy poor nulling of myocardium, elevated ECV and mildly elevated T1 suggests possibility of infiltrative cardiomyopathy.  - PYP 12/21 negative. - Cath with mild CAD and elevated filling pressures. - NYHA class II, euvolemic on exam. - Increase carvedilol to 6.25 mg in AM & 12.5 mg in PM x 1 week, then increase to 12.5 mg bid. - Continue entresto 97-103 mg bid. -Continuespiro 25 mg daily.   - Continue digoxin 0.125 mg. Dig level 0.5 - Continue lasix 40 daily. - In DAPA trial. - No Bidil for now. - BMET, BNP, digoxin level today.  2. CAD, mild non-obstructive - on statin. No ASA with Eliquis - LHC 10/17/20   Prox LAD to Mid LAD lesion is 30% stenosed.  Mid LAD lesion is 30% stenosed.  Dist LAD lesion is 30% stenosed. Apical LAD with diffuse disease  1st Diag lesion is 70% stenosed. - No s/s angina.  3. LV Thrombus  - On Eliquis. No bleeding.  4. Hypertension  - BP stable. Continue home BP checks. - Continue current  regimen.  5. ETOH Abuse  - Consumes 2-3 drinks of liquor/ day. - Encouraged complete abstinence.  6. IDA - Iron sats low in-patient.  - Received feraheme 12/1. - followed by PCP.   7. NSVT  - Continue Life Vest.  - OK to resume driving. - Increase carvedilol to 6.25 mg in AM & 12.5 mg in PM x 1 week, then increase to 12.5 mg bid.  8. Probable OSA - needs sleep study.   Arvilla Meres, MD  12:57 PM

## 2020-10-29 NOTE — Progress Notes (Signed)
Internal Medicine Clinic Attending  Case discussed with Dr. Seawell  At the time of the visit.  We reviewed the resident's history and exam and pertinent patient test results.  I agree with the assessment, diagnosis, and plan of care documented in the resident's note.  

## 2020-10-30 ENCOUNTER — Ambulatory Visit (HOSPITAL_COMMUNITY)
Admit: 2020-10-30 | Discharge: 2020-10-30 | Disposition: A | Discharge status: Home / Self Care | Payer: Medicaid Other | Attending: Internal Medicine | Admitting: Internal Medicine

## 2020-10-30 ENCOUNTER — Encounter (HOSPITAL_COMMUNITY): Payer: Self-pay | Admitting: Internal Medicine

## 2020-10-30 ENCOUNTER — Other Ambulatory Visit: Payer: Self-pay

## 2020-10-30 ENCOUNTER — Inpatient Hospital Stay (INDEPENDENT_AMBULATORY_CARE_PROVIDER_SITE_OTHER): Payer: Self-pay | Admitting: Primary Care

## 2020-10-30 VITALS — BP 124/88 | HR 89 | Wt 286.6 lb

## 2020-10-30 DIAGNOSIS — I472 Ventricular tachycardia: Secondary | ICD-10-CM | POA: Diagnosis not present

## 2020-10-30 DIAGNOSIS — I2510 Atherosclerotic heart disease of native coronary artery without angina pectoris: Secondary | ICD-10-CM | POA: Diagnosis not present

## 2020-10-30 DIAGNOSIS — I5022 Chronic systolic (congestive) heart failure: Secondary | ICD-10-CM

## 2020-10-30 DIAGNOSIS — I110 Hypertensive heart disease with heart failure: Secondary | ICD-10-CM | POA: Insufficient documentation

## 2020-10-30 DIAGNOSIS — D509 Iron deficiency anemia, unspecified: Secondary | ICD-10-CM | POA: Diagnosis not present

## 2020-10-30 DIAGNOSIS — Z8249 Family history of ischemic heart disease and other diseases of the circulatory system: Secondary | ICD-10-CM | POA: Diagnosis not present

## 2020-10-30 DIAGNOSIS — Z7901 Long term (current) use of anticoagulants: Secondary | ICD-10-CM | POA: Insufficient documentation

## 2020-10-30 DIAGNOSIS — I4729 Other ventricular tachycardia: Secondary | ICD-10-CM

## 2020-10-30 DIAGNOSIS — E669 Obesity, unspecified: Secondary | ICD-10-CM | POA: Insufficient documentation

## 2020-10-30 DIAGNOSIS — I4510 Unspecified right bundle-branch block: Secondary | ICD-10-CM | POA: Insufficient documentation

## 2020-10-30 DIAGNOSIS — I513 Intracardiac thrombosis, not elsewhere classified: Secondary | ICD-10-CM | POA: Insufficient documentation

## 2020-10-30 DIAGNOSIS — I5082 Biventricular heart failure: Secondary | ICD-10-CM | POA: Diagnosis present

## 2020-10-30 DIAGNOSIS — Z79899 Other long term (current) drug therapy: Secondary | ICD-10-CM | POA: Diagnosis not present

## 2020-10-30 DIAGNOSIS — I10 Essential (primary) hypertension: Secondary | ICD-10-CM | POA: Diagnosis not present

## 2020-10-30 DIAGNOSIS — F1010 Alcohol abuse, uncomplicated: Secondary | ICD-10-CM | POA: Insufficient documentation

## 2020-10-30 LAB — BASIC METABOLIC PANEL
Anion gap: 12 (ref 5–15)
BUN: 30 mg/dL — ABNORMAL HIGH (ref 6–20)
CO2: 21 mmol/L — ABNORMAL LOW (ref 22–32)
Calcium: 9.4 mg/dL (ref 8.9–10.3)
Chloride: 102 mmol/L (ref 98–111)
Creatinine, Ser: 1.19 mg/dL (ref 0.61–1.24)
GFR, Estimated: >60 mL/min (ref >60)
Glucose, Bld: 99 mg/dL (ref 70–99)
Potassium: 4.5 mmol/L (ref 3.5–5.1)
Sodium: 135 mmol/L (ref 135–145)

## 2020-10-30 LAB — DIGOXIN LEVEL: Digoxin Level: 0.5 ng/mL — ABNORMAL LOW (ref 0.8–2.0)

## 2020-10-30 LAB — BRAIN NATRIURETIC PEPTIDE: B Natriuretic Peptide: 1080.4 pg/mL — ABNORMAL HIGH (ref 0.0–100.0)

## 2020-10-30 MED ORDER — CARVEDILOL 12.5 MG PO TABS
12.5000 mg | ORAL_TABLET | Freq: Two times a day (BID) | ORAL | 3 refills | Status: DC
Start: 2020-10-30 — End: 2020-11-28

## 2020-10-30 NOTE — Patient Instructions (Signed)
Increase Coreg to 6.25 mg in the am and 12.5 mg in the PM Only for 1 Week, then increase to 12.5 mg Twice daily (we have sent you in a prescription for the 12.5 mg tablet)  Lab work done today, we will call you with any abnormal results  If you have any questions or concerns before your next appointment please send [place] a message through [place] or call our office at [phone].    TO LEAVE A MESSAGE FOR THE NURSE SELECT OPTION 2, PLEASE LEAVE A MESSAGE INCLUDING: . YOUR NAME . DATE OF BIRTH . CALL BACK NUMBER . REASON FOR CALL**this is important as we prioritize the call backs  YOU WILL RECEIVE A CALL BACK THE SAME DAY AS LONG AS YOU CALL BEFORE 4:00 PM   At the [facility], you and your health needs are our priority. As part of our continuing mission to provide you with exceptional heart care, we have created designated Provider Care Teams. These Care Teams include your primary Cardiologist (physician) and Advanced Practice Providers (APPs- Physician Assistants and Nurse Practitioners) who all work together to provide you with the care you need, when you need it.   You may see any of the following providers on your designated Care Team at your next follow up: Marland Kitchen Dr Arvilla Meres . Dr Marca Ancona . Tonye Becket, NP . Robbie Lis, PA . Karle Plumber, PharmD   Please be sure to bring in all your medications bottles to every appointment.

## 2020-11-05 ENCOUNTER — Telehealth (HOSPITAL_COMMUNITY): Payer: Self-pay | Admitting: Licensed Clinical Social Worker

## 2020-11-05 ENCOUNTER — Other Ambulatory Visit (HOSPITAL_COMMUNITY): Payer: Self-pay

## 2020-11-05 ENCOUNTER — Telehealth: Payer: Self-pay

## 2020-11-05 ENCOUNTER — Other Ambulatory Visit (HOSPITAL_COMMUNITY): Payer: Self-pay | Admitting: Internal Medicine

## 2020-11-05 DIAGNOSIS — Z006 Encounter for examination for normal comparison and control in clinical research program: Secondary | ICD-10-CM

## 2020-11-05 MED ORDER — THIAMINE HCL 100 MG PO TABS
100.0000 mg | ORAL_TABLET | Freq: Every day | ORAL | 0 refills | Status: DC
Start: 2020-11-05 — End: 2020-11-28

## 2020-11-05 MED ORDER — FOLIC ACID 1 MG PO TABS
1.0000 mg | ORAL_TABLET | Freq: Every day | ORAL | 0 refills | Status: DC
Start: 2020-11-05 — End: 2020-11-28

## 2020-11-05 NOTE — Telephone Encounter (Signed)
The subject called stating that he was running low on his prescribed HF medications and would not be able to get them filled due to insurance not yet being active. Confirmed that subject still had IP and reviewed concomitant medications. Advised subject that the HF clinic would be able to provide resources. This RN spoke with [facility] social worker, Eileen Stanford who stated she would call the subject and look for available resources to support medication needs.

## 2020-11-05 NOTE — Telephone Encounter (Signed)
CSW informed by research staff that pt is reporting no way to get his medications after he runs out and is getting low.  CSW called pt to discuss.  Pt able to get almost all of his medications under the heart failure fund- CSW explained fund and how to get to [facility].  CSW also check with Patient Advocate regarding pt apps for Eliquis and Entresto.  Per advocate pt has not signed apps yet so CSW attached applications to samples for Eliquis and Entresto and informed pt he needs to sign them when he comes to pick up medications- pt expressed understanding.  Will continue to follow and assist as needed  Burna Sis, LCSW Clinical Social Worker [facility] Clinic Desk#: [phone] Cell#: [phone]

## 2020-11-06 MED FILL — CARVEDILOL 12.5 MG TABLET: 12.5 | 34 days supply | Qty: 68 | Fill #0

## 2020-11-07 ENCOUNTER — Telehealth (HOSPITAL_COMMUNITY): Payer: Self-pay

## 2020-11-07 ENCOUNTER — Encounter (HOSPITAL_COMMUNITY): Payer: Self-pay

## 2020-11-07 NOTE — Telephone Encounter (Signed)
Pt returned CR phone call and stated he is still waiting for his Medicaid to go thru and he is unable to work the [employer]. Pt stated he will contact CR when he is ready to participate.  Closed referral

## 2020-11-07 NOTE — Telephone Encounter (Signed)
Attempted to call patient in regards to Cardiac Rehab - LM on VM Mailed letter 

## 2020-11-07 NOTE — Telephone Encounter (Signed)
Sent in applications via fax.  Will follow up.  

## 2020-11-18 ENCOUNTER — Other Ambulatory Visit: Payer: Self-pay

## 2020-11-18 ENCOUNTER — Encounter: Payer: Self-pay | Admitting: *Deleted

## 2020-11-18 DIAGNOSIS — Z006 Encounter for examination for normal comparison and control in clinical research program: Secondary | ICD-10-CM

## 2020-11-18 NOTE — Research (Signed)
   Month 1 Visit Worksheet  PATIENT ID: _[id number] DATE OF CONTACT:03JAN2022   Type of Visit*:   *Visit should be done in person whenever possible  [x]  Clinic Visit                         []  Telephone Contact with Patient     []  Family Member or Caregiver  []  Primary Care Physician   []  Medical Record Review                    []  Other (specify)____________  1. Did patient experience any AEs leading to study drug discontinuation, SAEs related to study drug, protocol-defined unexpected/unanticipated events, clinical endpoints (death, worsening HF), or adverse events of special interest since the last visit?  []     Yes*  [x]      No *If yes, please record in [employer] EDC (eCRF)  2.  HF Signs and Symptoms Assessed by: 3.   Vital Signs  Pulse 86   Height 73 []  cm  [x]   in (xxx.xx)  B/P 115/70  Body Weight 277 []  kg   [x]   lbs  (xxx.xx)    4.   Concomitant medications Enter current heart failure and diabetes medications in the eCRF  5.   Is Patient currently taking study drug?  [x]  Yes    or   []  No*        *If no, describe reason:   6.   Study Drug Compliance  Date last tablet taken:  03JAN2022  Did patient bring bottles with them to visit? [x]  Yes []  No  Bottle number 1:  [id number]  Opened? Yes  Number of tablets remaining 0     BOTTLE RETURNED TO PHARMACY   Bottle number 2:  [id number]  Opened?  Yes Number of tablets remaining34  How many days did patient NOT take the study drug since last visit?  __0_Days  Investigator judgment of Compliance: [x]  Reasonable (? 20% deviation from expected)        []  Questionable (>20% deviation from expected   7.   Local Labs Performed Creatinine   1.54   units mg/dl  Potassium   4.7    units mmol/L  Date 03JAN2022 Local Labs Performed  Creatinine  [x]  Yes  []  No Potassium  [x]   Yes []  No .    8.   Other Items  [x]  Schedule Month 2 Follow Up Visit 04FEB2022 [x]  Remind patient of importance of complying with  study medication (1 pill per day)  [x]  Remind patient to bring both study medication bottles (even if empty) to next clinic visit  [x]  Review Patient Contact Information Sheet, update if necessary             DAPA ACT HF-TIMI 68     Month 1 Visit Worksheet   Version 2.0 Date:  20 Jun 2020

## 2020-11-19 ENCOUNTER — Ambulatory Visit: Payer: Self-pay

## 2020-11-19 LAB — BASIC METABOLIC PANEL
BUN/Creatinine Ratio: 19 (ref 10–24)
BUN: 29 mg/dL — ABNORMAL HIGH (ref 8–27)
CO2: 25 mmol/L (ref 20–29)
Calcium: 10 mg/dL (ref 8.6–10.2)
Chloride: 96 mmol/L (ref 96–106)
Creatinine, Ser: 1.54 mg/dL — ABNORMAL HIGH (ref 0.76–1.27)
GFR calc Af Amer: 56 mL/min/{1.73_m2} — ABNORMAL LOW (ref >59)
GFR calc non Af Amer: 48 mL/min/{1.73_m2} — ABNORMAL LOW (ref >59)
Glucose: 98 mg/dL (ref 65–99)
Potassium: 4.7 mmol/L (ref 3.5–5.2)
Sodium: 134 mmol/L (ref 134–144)

## 2020-11-22 NOTE — Telephone Encounter (Signed)
Advanced Heart Failure Patient Advocate Encounter   Patient was approved to receive Eliquis from [employer]  Patient ID: [id number] Effective dates: 11/21/20 through 11/20/21  Have yet to hear back on the patient's [employer] application at this time.

## 2020-11-25 ENCOUNTER — Other Ambulatory Visit (HOSPITAL_COMMUNITY): Payer: Self-pay

## 2020-11-25 DIAGNOSIS — I5022 Chronic systolic (congestive) heart failure: Secondary | ICD-10-CM

## 2020-11-25 NOTE — Progress Notes (Signed)
Orders Placed This Encounter  Procedures  . ECHOCARDIOGRAM COMPLETE    Standing Status:   Future    Standing Expiration Date:   11/25/2021    Order Specific Question:   Where should this test be performed    Answer:   [place]    Order Specific Question:   Perflutren DEFINITY (image enhancing agent) should be administered unless hypersensitivity or allergy exist    Answer:   Administer Perflutren    Order Specific Question:   Reason for exam-Echo    Answer:   Congestive Heart Failure  I50.9    Order Specific Question:   Release to patient    Answer:   Immediate

## 2020-11-28 ENCOUNTER — Encounter: Payer: Self-pay | Admitting: Student

## 2020-11-28 ENCOUNTER — Ambulatory Visit (INDEPENDENT_AMBULATORY_CARE_PROVIDER_SITE_OTHER): Payer: Medicaid Other | Admitting: Student

## 2020-11-28 ENCOUNTER — Other Ambulatory Visit: Payer: Self-pay

## 2020-11-28 VITALS — BP 139/82 | HR 84 | Temp 98.1°F | Ht 75.0 in | Wt 291.1 lb

## 2020-11-28 DIAGNOSIS — I472 Ventricular tachycardia: Secondary | ICD-10-CM | POA: Diagnosis not present

## 2020-11-28 DIAGNOSIS — I2510 Atherosclerotic heart disease of native coronary artery without angina pectoris: Secondary | ICD-10-CM | POA: Diagnosis not present

## 2020-11-28 DIAGNOSIS — N179 Acute kidney failure, unspecified: Secondary | ICD-10-CM | POA: Diagnosis not present

## 2020-11-28 DIAGNOSIS — Z9189 Other specified personal risk factors, not elsewhere classified: Secondary | ICD-10-CM

## 2020-11-28 DIAGNOSIS — I10 Essential (primary) hypertension: Secondary | ICD-10-CM | POA: Diagnosis not present

## 2020-11-28 DIAGNOSIS — I5042 Chronic combined systolic (congestive) and diastolic (congestive) heart failure: Secondary | ICD-10-CM

## 2020-11-28 DIAGNOSIS — I5082 Biventricular heart failure: Secondary | ICD-10-CM

## 2020-11-28 DIAGNOSIS — D509 Iron deficiency anemia, unspecified: Secondary | ICD-10-CM | POA: Diagnosis not present

## 2020-11-28 DIAGNOSIS — I4729 Other ventricular tachycardia: Secondary | ICD-10-CM

## 2020-11-28 MED ORDER — CARVEDILOL 12.5 MG PO TABS
12.5000 mg | ORAL_TABLET | Freq: Two times a day (BID) | ORAL | 2 refills | Status: DC
Start: 2020-11-28 — End: 2021-02-17

## 2020-11-28 MED ORDER — SPIRONOLACTONE 25 MG PO TABS
25.0000 mg | ORAL_TABLET | Freq: Every day | ORAL | 2 refills | Status: DC
Start: 2020-11-28 — End: 2021-05-12

## 2020-11-28 MED ORDER — FUROSEMIDE 40 MG PO TABS
40.0000 mg | ORAL_TABLET | Freq: Every day | ORAL | 1 refills | Status: DC
Start: 2020-11-28 — End: 2020-12-20

## 2020-11-28 MED ORDER — FOLIC ACID 1 MG PO TABS
1.0000 mg | ORAL_TABLET | Freq: Every day | ORAL | 1 refills | Status: DC
Start: 2020-11-28 — End: 2021-02-17

## 2020-11-28 MED ORDER — ATORVASTATIN CALCIUM 40 MG PO TABS: 40.0000 mg | ORAL_TABLET | Freq: Every day | ORAL | 1 refills | Status: DC

## 2020-11-28 MED ORDER — THIAMINE HCL 100 MG PO TABS
100.0000 mg | ORAL_TABLET | Freq: Every day | ORAL | 0 refills | Status: DC
Start: 2020-11-28 — End: 2021-01-23

## 2020-11-28 NOTE — Patient Instructions (Signed)
Thank you, Paul [name] for allowing [place] to provide your care today. Today we discussed your heart failure and blood pressure.  We are glad that you are feeling better with the current medications that you are on.  We are refilling your medications so you can continue taking them as prescribed.  I have ordered the following labs for you:   Lab Orders     BMP8+Anion Gap     Iron     CBC no Diff     Ferritin   I will call if any are abnormal. All of your labs can be accessed through "My Chart".  I have refilled all your medications, please pick them up at your pharmacy. Please follow-up with cardiology on February 4th.   Please follow-up in 4 weeks  Should you have any questions or concerns please call the internal medicine clinic at [phone].    Sharrell Ku, MD, MPH [facility]   My Chart Access: [email or web address]   If you have not already done so, please get your COVID 19 vaccine  To schedule an appointment for a COVID vaccine choice any of the following: Go to [email or web address]   Go to [email or web address]                  Call [phone]                                     Call [phone] and select Option 2

## 2020-11-28 NOTE — Progress Notes (Signed)
   CC: Follow-up/medication refill  HPI:  Paul [name] is a 60 y.o. male with PMH as below who presents to clinic for refill of his medications. Please see problem based charting for evaluation, assessment and plan.  Past Medical History:  Diagnosis Date  . CHF (congestive heart failure) (HCC)   . Dyspnea   . History of hip replacement   . Hypertension     Review of Systems:  Constitutional: Negative for fever or fatigue. Positive for occasional dizziness Eyes: Negative for visual changes Respiratory: Negative for shortness of breath  Cardiac: Negative for chest pain or palpitations Extremities: Negative for leg swelling Abdomen: Negative for abdominal pain, constipation or diarrhea Neuro: Negative for headache or weakness  Physical Exam: General: Pleasant elderly man with LifeVest on. No acute distress. Neck: Supple. No JVD. Cardiac: RRR. No murmurs, rubs or gallops. No LE edema Respiratory: Lungs CTAB. No wheezing or crackles. Abdominal: Soft, and non tender. Mild distention.  Normal BS. Skin: Warm, dry and intact without rashes or lesions Extremities: Atraumatic. Full ROM. Pulse palpable. Neuro: A&O x 3. Moves all extremities. No focal deficits Psych: Appropriate mood and affect.  Vitals:   11/28/20 1352 11/28/20 1354  BP: (!) 146/82 139/82  Pulse: 84   Temp: 98.1 F (36.7 C)   TempSrc: Oral   SpO2: 100%   Weight: 291 lb 1.6 oz (132 kg)   Height: 6\' 3"  (1.905 m)     Assessment & Plan:   See Encounters Tab for problem based charting.  Patient discussed with Dr. , MD, MPH

## 2020-11-28 NOTE — Progress Notes (Incomplete)
   CC: Follow-up/medication refill  HPI:  [name] is a 60 y.o. male with PMH as below who presents to clinic for refill of his medications. Please see problem based charting for evaluation, assessment and plan.  Past Medical History:  Diagnosis Date  . CHF (congestive heart failure) (HCC)   . Dyspnea   . History of hip replacement   . Hypertension     Review of Systems:  Constitutional: Negative for fever or fatigue Eyes: Negative for visual changes Respiratory: Negative for shortness of breath Cardiac: Negative for chest pain MSK: Negative for back pain Abdomen: Negative for abdominal pain, constipation or diarrhea Neuro: Negative for headache or weakness  Physical Exam: General: Pleasant *** No acute distress. Cardiac: RRR. No murmurs, rubs or gallops.  No LE edema Respiratory: Lungs CTAB. No wheezing or crackles. Abdominal: Soft, symmetric and non tender. Normal BS. Skin: Warm, dry and intact without rashes or lesions Extremities: Atraumatic. Full ROM. Pulse palpable. Neuro: A&O x 3. Moves all extremities Psych: Appropriate mood and affect.  Vitals:   11/28/20 1352 11/28/20 1354  BP: (!) 146/82 139/82  Pulse: 84   Temp: 98.1 F (36.7 C)   TempSrc: Oral   SpO2: 100%   Weight: 291 lb 1.6 oz (132 kg)   Height: 6\' 3"  (1.905 m)    ***  Assessment & Plan:   See Encounters Tab for problem based charting.  Patient {GC/GE:[id number]::"discussed with","seen with"} Dr. {NAMES:[id number]::"Butcher","Guilloud","Hoffman","Mullen","Narendra","Raines","Vincent"}  , MD, MPH

## 2020-11-29 ENCOUNTER — Encounter: Payer: Self-pay | Admitting: Student

## 2020-11-29 LAB — CBC
Hematocrit: 40.5 % (ref 37.5–51.0)
Hemoglobin: 12.9 g/dL — ABNORMAL LOW (ref 13.0–17.7)
MCH: 23.7 pg — ABNORMAL LOW (ref 26.6–33.0)
MCHC: 31.9 g/dL (ref 31.5–35.7)
MCV: 74 fL — ABNORMAL LOW (ref 79–97)
Platelets: 248 10*3/uL (ref 150–450)
RBC: 5.44 x10E6/uL (ref 4.14–5.80)
RDW: 18.8 % — ABNORMAL HIGH (ref 11.6–15.4)
WBC: 4.9 10*3/uL (ref 3.4–10.8)

## 2020-11-29 LAB — IRON: Iron: 41 ug/dL (ref 38–169)

## 2020-11-29 LAB — BMP8+ANION GAP
Anion Gap: 17 mmol/L (ref 10.0–18.0)
BUN/Creatinine Ratio: 15 (ref 10–24)
BUN: 19 mg/dL (ref 8–27)
CO2: 24 mmol/L (ref 20–29)
Calcium: 9.3 mg/dL (ref 8.6–10.2)
Chloride: 95 mmol/L — ABNORMAL LOW (ref 96–106)
Creatinine, Ser: 1.25 mg/dL (ref 0.76–1.27)
GFR calc Af Amer: 72 mL/min/{1.73_m2} (ref >59)
GFR calc non Af Amer: 62 mL/min/{1.73_m2} (ref >59)
Glucose: 84 mg/dL (ref 65–99)
Potassium: 4.6 mmol/L (ref 3.5–5.2)
Sodium: 136 mmol/L (ref 134–144)

## 2020-11-29 LAB — FERRITIN: Ferritin: 201 ng/mL (ref 30–400)

## 2020-11-29 NOTE — Assessment & Plan Note (Addendum)
Patient states he continues to feel well. He denies dyspnea on exertion, palpitations, chest pain, headache, leg swelling, or blurry vision.  Patient does endorse occasional feeling of lightheadedness when he stands up too quickly. Saw heart failure last month and was informed to take extra dose of his Lasix if his weight is above 280 lbs.  Patient's weight is currently 291 lbs in clinic.  Patient states at home, his BP runs systolic in the 110s to 120s.  He is taking all his medications as prescribed and wearing his LifeVest but needs refill of multiple medications.  On exam, there is no JVD, rales or lower extremity edema however patient has mild abdominal distention.  Patient is currently on GDMT and part of the [place] study.  --Refilled Coreg 12.5 mg twice daily --Refilled Lasix 40 mg daily --Refilled spironolactone 25 mg daily --Continue digoxin 0.125 mg and is daily --Continue Entresto 97-103 mg twice daily --Continue Farxiga 10 mg daily --Follow-up BMP --Follow-up with HF on 12/20/20  Addendum BMP with creatinine now within normal limits.  No electrolyte abnormalities.  Patient informed.

## 2020-11-29 NOTE — Assessment & Plan Note (Signed)
Last [facility] cardiology appointment showed creatinine bump to 1.54 from 1.19.  Patient looks euvolemic in clinic.  No JVD, rales or lower extremity edema.  Plan to recheck BMP and monitor closely. --Follow-up BMP --Continue GDMT for heart failure

## 2020-11-29 NOTE — Assessment & Plan Note (Signed)
Patient continues to have LifeVest on.  Patient able to drive now according to Dr. Gala Romney.  Scheduled to follow-up with Dr. Gala Romney on February 4th.  Patient denies any palpitations, chest pain or dizziness. -- Follow-up with cardiology on 12/20/20

## 2020-11-29 NOTE — Assessment & Plan Note (Signed)
Patient states he had home sleep study completed and is scheduled to follow-up soon.  --Follow-up sleep study results

## 2020-11-29 NOTE — Assessment & Plan Note (Addendum)
Patient's last lipid panel on 11/29 was significant for HDL of 31. Currently on high intensity statin.  Encourage patient to continue lifestyle modifications.  -- Refilled atorvastatin 40 mg daily --Repeat lipid panel in 3 months

## 2020-11-29 NOTE — Assessment & Plan Note (Addendum)
Patient was found to have low iron during hospitalization last month.  Hemoglobin remained stable at 13.8 a month ago.  Patient currently on Eliquis but has not seen any signs of bleeding.  Continue to monitor closely. --Follow-up repeat CBC --Follow-up repeat iron, ferritin --Pending colonoscopy  Addendum CBC showed stable hemoglobin at 12.9. Iron and ferritin levels are within normal limits.

## 2020-12-03 NOTE — Progress Notes (Signed)
Internal Medicine Clinic Attending  Case discussed with Dr. A[place] at the time of the visit.  We reviewed the resident's history and exam and pertinent patient test results.  I agree with the assessment, diagnosis, and plan of care documented in the resident's note.  Kentrail Shew, M.D., Ph.D.  

## 2020-12-05 ENCOUNTER — Ambulatory Visit: Payer: Self-pay

## 2020-12-16 MED FILL — CARVEDILOL 12.5 MG TABLET: 12.5 | 34 days supply | Qty: 68 | Fill #1

## 2020-12-16 MED FILL — FUROSEMIDE 40 MG TAB: 40 | 34 days supply | Qty: 34 | Fill #0

## 2020-12-17 ENCOUNTER — Other Ambulatory Visit (HOSPITAL_COMMUNITY): Payer: Self-pay

## 2020-12-17 MED ORDER — SACUBITRIL-VALSARTAN 97-103 MG PO TABS
1.0000 | ORAL_TABLET | Freq: Two times a day (BID) | ORAL | 11 refills | Status: DC
Start: 2020-12-17 — End: 2020-12-18

## 2020-12-18 ENCOUNTER — Other Ambulatory Visit (HOSPITAL_COMMUNITY): Payer: Self-pay

## 2020-12-18 MED ORDER — SACUBITRIL-VALSARTAN 97-103 MG PO TABS
1.0000 | ORAL_TABLET | Freq: Two times a day (BID) | ORAL | 11 refills | Status: DC
Start: 2020-12-18 — End: 2021-03-31

## 2020-12-20 ENCOUNTER — Encounter (HOSPITAL_COMMUNITY): Payer: Self-pay | Admitting: Internal Medicine

## 2020-12-20 ENCOUNTER — Other Ambulatory Visit: Payer: Self-pay

## 2020-12-20 ENCOUNTER — Ambulatory Visit (HOSPITAL_BASED_OUTPATIENT_CLINIC_OR_DEPARTMENT_OTHER)
Admission: RE | Admit: 2020-12-20 | Discharge: 2020-12-20 | Disposition: A | Discharge status: Home / Self Care | Payer: Medicaid Other | Source: Ambulatory Visit | Attending: Internal Medicine | Admitting: Internal Medicine

## 2020-12-20 ENCOUNTER — Encounter: Payer: Self-pay | Admitting: *Deleted

## 2020-12-20 ENCOUNTER — Telehealth (HOSPITAL_COMMUNITY): Payer: Self-pay | Admitting: Pharmacy Technician

## 2020-12-20 ENCOUNTER — Ambulatory Visit (HOSPITAL_COMMUNITY)
Admission: RE | Admit: 2020-12-20 | Discharge: 2020-12-20 | Disposition: A | Discharge status: Home / Self Care | Payer: Medicaid Other | Source: Ambulatory Visit | Attending: Internal Medicine | Admitting: Internal Medicine

## 2020-12-20 VITALS — BP 140/80 | HR 91 | Wt 283.6 lb

## 2020-12-20 DIAGNOSIS — I5022 Chronic systolic (congestive) heart failure: Secondary | ICD-10-CM | POA: Diagnosis not present

## 2020-12-20 DIAGNOSIS — I10 Essential (primary) hypertension: Secondary | ICD-10-CM

## 2020-12-20 DIAGNOSIS — Z8249 Family history of ischemic heart disease and other diseases of the circulatory system: Secondary | ICD-10-CM | POA: Insufficient documentation

## 2020-12-20 DIAGNOSIS — I2510 Atherosclerotic heart disease of native coronary artery without angina pectoris: Secondary | ICD-10-CM

## 2020-12-20 DIAGNOSIS — Z7901 Long term (current) use of anticoagulants: Secondary | ICD-10-CM | POA: Insufficient documentation

## 2020-12-20 DIAGNOSIS — Z79899 Other long term (current) drug therapy: Secondary | ICD-10-CM | POA: Diagnosis not present

## 2020-12-20 DIAGNOSIS — I513 Intracardiac thrombosis, not elsewhere classified: Secondary | ICD-10-CM | POA: Insufficient documentation

## 2020-12-20 DIAGNOSIS — E669 Obesity, unspecified: Secondary | ICD-10-CM | POA: Diagnosis not present

## 2020-12-20 DIAGNOSIS — I472 Ventricular tachycardia: Secondary | ICD-10-CM

## 2020-12-20 DIAGNOSIS — I110 Hypertensive heart disease with heart failure: Secondary | ICD-10-CM | POA: Insufficient documentation

## 2020-12-20 DIAGNOSIS — I5023 Acute on chronic systolic (congestive) heart failure: Secondary | ICD-10-CM | POA: Insufficient documentation

## 2020-12-20 DIAGNOSIS — F1010 Alcohol abuse, uncomplicated: Secondary | ICD-10-CM | POA: Diagnosis not present

## 2020-12-20 DIAGNOSIS — I4729 Other ventricular tachycardia: Secondary | ICD-10-CM

## 2020-12-20 DIAGNOSIS — Z006 Encounter for examination for normal comparison and control in clinical research program: Secondary | ICD-10-CM

## 2020-12-20 LAB — BASIC METABOLIC PANEL
Anion gap: 13 (ref 5–15)
BUN: 28 mg/dL — ABNORMAL HIGH (ref 6–20)
CO2: 24 mmol/L (ref 22–32)
Calcium: 9.4 mg/dL (ref 8.9–10.3)
Chloride: 98 mmol/L (ref 98–111)
Creatinine, Ser: 1.41 mg/dL — ABNORMAL HIGH (ref 0.61–1.24)
GFR, Estimated: 57 mL/min — ABNORMAL LOW (ref >60)
Glucose, Bld: 97 mg/dL (ref 70–99)
Potassium: 4.2 mmol/L (ref 3.5–5.1)
Sodium: 135 mmol/L (ref 135–145)

## 2020-12-20 LAB — CBC
HCT: 43.9 % (ref 39.0–52.0)
Hemoglobin: 13.6 g/dL (ref 13.0–17.0)
MCH: 23.7 pg — ABNORMAL LOW (ref 26.0–34.0)
MCHC: 31 g/dL (ref 30.0–36.0)
MCV: 76.5 fL — ABNORMAL LOW (ref 80.0–100.0)
Platelets: 282 10*3/uL (ref 150–400)
RBC: 5.74 MIL/uL (ref 4.22–5.81)
RDW: 21.5 % — ABNORMAL HIGH (ref 11.5–15.5)
WBC: 5.9 10*3/uL (ref 4.0–10.5)
nRBC: 0 % (ref 0.0–0.2)

## 2020-12-20 LAB — ECHOCARDIOGRAM COMPLETE
Area-P 1/2: 4.49 cm2
Calc EF: 50.5 %
S' Lateral: 4.5 cm
Single Plane A2C EF: 49.5 %
Single Plane A4C EF: 50.2 %

## 2020-12-20 LAB — BRAIN NATRIURETIC PEPTIDE: B Natriuretic Peptide: 212.5 pg/mL — ABNORMAL HIGH (ref 0.0–100.0)

## 2020-12-20 MED ORDER — ASPIRIN EC 81 MG PO TBEC: 81.0000 mg | DELAYED_RELEASE_TABLET | Freq: Every day | ORAL | 3 refills | Status: DC

## 2020-12-20 MED ORDER — FUROSEMIDE 40 MG PO TABS: 40.0000 mg | ORAL_TABLET | ORAL | 1 refills | Status: DC | PRN

## 2020-12-20 NOTE — Progress Notes (Signed)
  Echocardiogram 2D Echocardiogram has been performed.  Augustine Radar 12/20/2020, 11:44 AM

## 2020-12-20 NOTE — Research (Signed)
    Month 2 Visit Worksheet  PATIENT ID: _____[id number]____   DATE OF CONTACT:  _04/FEB/2022   Type of Visit:  (Visit should be done in person whenever possible)   [x]  Clinic Visit                         []  Telephone Contact with Patient     []  Family Member or Caregiver  []  Primary Care Physician  []  Medical Record Review   []  Other (specify)____________  1. Did patient experience any AEs leading to study drug discontinuation, SAEs related to study drug, protocol-defined unexpected/unanticipated events, clinical endpoints (death, worsening HF), or adverse events of special interest since the last visit?  []     Yes*  [x]      No *If yes, please record in [employer] EDC (eCRF)  2. HF Signs and Symptoms Assessed by:  3. Vital Signs  Pulse _91    Height 75 []  cm [x]  in (xxx.xx)  B/P 140/80   Body Weight 283.9 []  kg [x]  lbs  (xxx.xx)  4. Concomitant medications Enter current heart failure and diabetes medications in the eCRF  5. Is Patient currently taking study drug?  [x]  Yes    or   []  No*     *If no, describe reason:    6. Study Drug Compliance  Date last tablet taken:  _04/FEB/2022 Did patient bring bottles with them to visit? [x]  Yes []  No  Bottle number 1: [id number]   Opened?  Yes  Number of tablets remaining _6__ Bottle number 2: [id number]   was returned on 03/JAN/2022 and returned to pharmacy     How many days did patient NOT take the study drug since last visit?  0  Days  Investigator judgment of Compliance: [x]  Reasonable (? 20% deviation from expected)        []  Questionable (>20% deviation from expected)  7. Local Labs Performed Creatinine   ___1.41_____   units mg/dl Date  Potassium   __4.2______   units mmol/L  Date 04/FEB/2022  1318  8. Was KCCQ questionnaire completed?  [x]  Yes  []  No   9. Other Items  [x]  Ensure the Adverse Events Question is answered in eCRF  [x]  Complete End of Study Visit Pages in eCRF, including date of last  dose of study drug    DAPA ACT HF-TIMI 68 Month 2 Visit Worksheet      Version 2.0 Date: 20 Jun 2020

## 2020-12-20 NOTE — Telephone Encounter (Signed)
Advanced Heart Failure Patient Advocate Encounter   Patient was approved to receive Entresto from [employer]  Patient ID: [id number] Effective dates: 11/28/20 through 11/28/21  Patient was in clinic today, provided him the phone number to [employer] and a week of samples since he was out and its Friday. I do not know if they will be able to overnight the medication to him.  LOT [id number] EXP 12/23  Archer Asa, CPhT

## 2020-12-20 NOTE — Patient Instructions (Signed)
START Aspirin 81mg  (1 tablet) daily  CHANGE Lasix to as needed only  STOP Digoxin  STOP Eliquis  STOP Lifevest, someone will contact you about picking up equipment  Labs done today, your results will be available in MyChart, we will contact you for abnormal readings.  Your physician recommends that you schedule a follow-up appointment in: 4 months  If you have any questions or concerns before your next appointment please send a message through [place] or call our office at [phone].    TO LEAVE A MESSAGE FOR THE NURSE SELECT OPTION 2, PLEASE LEAVE A MESSAGE INCLUDING: . YOUR NAME . DATE OF BIRTH . CALL BACK NUMBER . REASON FOR CALL**this is important as we prioritize the call backs  YOU WILL RECEIVE A CALL BACK THE SAME DAY AS LONG AS YOU CALL BEFORE 4:00 PM

## 2020-12-20 NOTE — Progress Notes (Signed)
ADVANCED HEART FAILURE CLINIC NOTE  PCP: Steffanie Rainwater, MD Primary Cardiologist: DB  HPI:  Paul [name] is a 60 y/o male with obesity, HTN and ETOH use. Admitted in 11/21 with new onset HF.   Echo biventricular heart failure. LVEF 20-25% w/ global hypokinesis, mild LVH. RV systolic function moderately reduced. There is an LV apical thrombus measuring 2.4 cm x 1.3 cm.  - cMRI Severe LVE EF 22%. 1 cm x 1.4 cm mobile apical thrombus. Mild RVE with moderate reduction in RVEF 31%. Combination of mid/basal hypertrophy poor nulling of myocardium, elevated ECV and mildly elevated T1 suggests possibility of infiltrative cardiomyopathy.   - PYP 12/21 negative  - LHC 10/17/20   Prox LAD to Mid LAD lesion is 30% stenosed.  Mid LAD lesion is 30% stenosed.  Dist LAD lesion is 30% stenosed. Apical LAD with diffuse disease  1st Diag lesion is 70% stenosed  Developed NSVT and LifeVest placed.  Echo today EF 55% RV normal. Personally reviewed  Here for f/u. Feels great. No CP or SOB. LifeVest has not gone off. Walking hills not getting winded. SBP 115-120. Weight 274   ROS: All systems negative except as listed in HPI, PMH and Problem List.  SH:  Social History   Socioeconomic History  . Marital status: Divorced    Spouse name: Not on file  . Number of children: Not on file  . Years of education: Not on file  . Highest education level: Not on file  Occupational History  . Not on file  Tobacco Use  . Smoking status: Never Smoker  . Smokeless tobacco: Never Used  Vaping Use  . Vaping Use: Never used  Substance and Sexual Activity  . Alcohol use: Yes  . Drug use: Never  . Sexual activity: Not on file  Other Topics Concern  . Not on file  Social History Narrative  . Not on file   Social Determinants of Health   Financial Resource Strain: Not on file  Food Insecurity: Not on file  Transportation Needs: Not on file  Physical Activity: Not on file  Stress: Not on  file  Social Connections: Not on file  Intimate Partner Violence: Not on file    FH:  Family History  Problem Relation Age of Onset  . Pulmonary Hypertension Mother   . Hypertension Mother   . CAD Father        s/p CABG x 4  . Heart failure Father   . Heart failure Sister   . Hypertension Sister     Past Medical History:  Diagnosis Date  . CHF (congestive heart failure) (HCC)   . Dyspnea   . History of hip replacement   . Hypertension     Current Outpatient Medications  Medication Sig Dispense Refill  . apixaban (ELIQUIS) 5 MG TABS tablet Take 1 tablet (5 mg total) by mouth 2 (two) times daily. 60 tablet 11  . atorvastatin (LIPITOR) 40 MG tablet Take 1 tablet (40 mg total) by mouth daily. 90 tablet 1  . carvedilol (COREG) 12.5 MG tablet Take 1 tablet (12.5 mg total) by mouth 2 (two) times daily with a meal. 120 tablet 2  . digoxin (LANOXIN) 0.125 MG tablet Take 1 tablet (0.125 mg total) by mouth daily. 30 tablet 11  . folic acid (FOLVITE) 1 MG tablet Take 1 tablet (1 mg total) by mouth daily. 30 tablet 1  . furosemide (LASIX) 40 MG tablet Take 1 tablet (40 mg total) by mouth  daily. 30 tablet 1  . sacubitril-valsartan (ENTRESTO) 97-103 MG Take 1 tablet by mouth 2 (two) times daily. 60 tablet 11  . spironolactone (ALDACTONE) 25 MG tablet Take 1 tablet (25 mg total) by mouth daily. 60 tablet 2  . Study - DAPA TIMI 68 - dapagliflozin (FARXIGA) 10 mg or placebo tablet (PI-Dalton McLean) Take 1 tablet by mouth daily. 30 tablet 0  . thiamine 100 MG tablet Take 1 tablet (100 mg total) by mouth daily. 30 tablet 0   No current facility-administered medications for this encounter.    Vitals:   12/20/20 1201  BP: 140/80  Pulse: 91  SpO2: 98%  Weight: 128.6 kg (283 lb 9.6 oz)    PHYSICAL EXAM:  General:  Well appearing. No resp difficulty HEENT: normal Neck: supple. no JVD. Carotids 2+ bilat; no bruits. No lymphadenopathy or thryomegaly appreciated. Cor: PMI nondisplaced.  Regular rate & rhythm. No rubs, gallops or murmurs. Lungs: clear Abdomen: soft, nontender, nondistended. No hepatosplenomegaly. No bruits or masses. Good bowel sounds. Extremities: no cyanosis, clubbing, rash, edema Neuro: alert & orientedx3, cranial nerves grossly intact. moves all 4 extremities w/o difficulty. Affect pleasant    ASSESSMENT & PLAN:  1. Chronic Systolic Heart Failure - Admitted 11/21 with acute systolic HF. Markedly hypertensive in ED. HS trop 113>>93. EKG sinus tach w/ PACs - Echo 11/21 LVEF 20-25% w/ global HK, mild LVH and moderately reduced RV (no prior study for comparison) - cMRI Severe LVE EF 22%. 1 cm x 1.4 cm mobile apical thrombus. Mild RVE with moderate reduction in RVEF 31%. Combination of mid/basal hypertrophy poor nulling of myocardium, elevated ECV and mildly elevated T1 suggests possibility of infiltrative cardiomyopathy.  - PYP 12/21 negative. - Cath with mild CAD and elevated filling pressures. - Echo 12/20/20 EF 55% RV normal. Personally reviewed - NYHA class II, euvolemic on exam. - Continue carvedilol 12.5 bid. - Continue entresto 97-103 mg bid. -Continuespiro 25 mg daily.   - Stop digoxin  - Can change lasix to PRN only - In DAPA trial. - No Bidil for now. - BMET, BNP, digoxin level today.  2. CAD, mild non-obstructive - on statin. Stop Eliquis. Start ASA 81 - LHC 10/17/20   Prox LAD to Mid LAD lesion is 30% stenosed.  Mid LAD lesion is 30% stenosed.  Dist LAD lesion is 30% stenosed. Apical LAD with diffuse disease  1st Diag lesion is 70% stenosed. - No s/s angina   3. LV Thrombus  - Resolved. Can stop Eliquis   4. Hypertension  - BP stable. Continue home BP checks. - Continue current regimen.  5. ETOH Abuse  - Consumes 2-3 drinks of liquor/ day. - Encouraged to limit intake as much as possible  6. NSVT  - EF normalized - Can remove LifeVest  8. Probable OSA - needs sleep study.   Arvilla Meres, MD  12:52  PM

## 2020-12-20 NOTE — Progress Notes (Signed)
Per Dr. Gala Romney: D/C Lifevest. [facility] contacted and made aware

## 2021-01-02 ENCOUNTER — Telehealth (HOSPITAL_COMMUNITY): Payer: Self-pay | Admitting: Pharmacy Technician

## 2021-01-02 NOTE — Telephone Encounter (Signed)
Although patient was approved to receive Eliquis from [employer] through 11/20/21, he was taken off of the medication.  Called [employer] and asked for the patient to be inactivated from the assistance program. Otherwise, he would continue to receive the medication via mail.  Archer Asa, CPhT

## 2021-01-15 NOTE — Addendum Note (Signed)
Addended by: Guinevere Scarlet A on: 01/15/2021 10:15 AM   Modules accepted: Orders

## 2021-01-22 ENCOUNTER — Other Ambulatory Visit: Payer: Self-pay | Admitting: Student

## 2021-01-23 MED FILL — CARVEDILOL 12.5 MG TABLET: 12.5 | 34 days supply | Qty: 68 | Fill #2

## 2021-01-23 MED FILL — FUROSEMIDE 40 MG TAB: 40 | 34 days supply | Qty: 34 | Fill #1

## 2021-02-17 ENCOUNTER — Other Ambulatory Visit: Payer: Self-pay

## 2021-02-17 ENCOUNTER — Other Ambulatory Visit (HOSPITAL_COMMUNITY): Payer: Self-pay

## 2021-02-17 ENCOUNTER — Ambulatory Visit: Payer: Medicaid Other | Admitting: Student

## 2021-02-17 VITALS — BP 140/81 | HR 85 | Temp 98.4°F | Ht 75.0 in | Wt 292.7 lb

## 2021-02-17 DIAGNOSIS — I10 Essential (primary) hypertension: Secondary | ICD-10-CM | POA: Insufficient documentation

## 2021-02-17 DIAGNOSIS — I2510 Atherosclerotic heart disease of native coronary artery without angina pectoris: Secondary | ICD-10-CM

## 2021-02-17 DIAGNOSIS — I4729 Other ventricular tachycardia: Secondary | ICD-10-CM

## 2021-02-17 DIAGNOSIS — N179 Acute kidney failure, unspecified: Secondary | ICD-10-CM

## 2021-02-17 DIAGNOSIS — I5082 Biventricular heart failure: Secondary | ICD-10-CM | POA: Diagnosis not present

## 2021-02-17 DIAGNOSIS — I513 Intracardiac thrombosis, not elsewhere classified: Secondary | ICD-10-CM | POA: Diagnosis not present

## 2021-02-17 DIAGNOSIS — I472 Ventricular tachycardia: Secondary | ICD-10-CM

## 2021-02-17 MED ORDER — FOLIC ACID 1 MG PO TABS
1.0000 mg | ORAL_TABLET | Freq: Every day | ORAL | 1 refills | Status: DC
  Filled 2021-02-17: qty 30, 30d supply, fill #0

## 2021-02-17 MED ORDER — FUROSEMIDE 40 MG PO TABS
40.0000 mg | ORAL_TABLET | ORAL | 1 refills | Status: DC | PRN
  Filled 2021-02-17: qty 30, 30d supply, fill #0
  Filled 2021-03-21: qty 30, 30d supply, fill #1

## 2021-02-17 MED ORDER — FUROSEMIDE 40 MG PO TABS
40.0000 mg | ORAL_TABLET | ORAL | 1 refills | Status: DC | PRN
  Filled 2021-02-17: qty 30, fill #0

## 2021-02-17 MED ORDER — CARVEDILOL 12.5 MG PO TABS
12.5000 mg | ORAL_TABLET | Freq: Two times a day (BID) | ORAL | 2 refills | Status: DC
  Filled 2021-02-17: qty 120, 60d supply, fill #0

## 2021-02-17 MED ORDER — CARVEDILOL 12.5 MG PO TABS
12.5000 mg | ORAL_TABLET | Freq: Two times a day (BID) | ORAL | 2 refills | Status: DC
  Filled 2021-02-17: qty 60, 30d supply, fill #0

## 2021-02-17 NOTE — Assessment & Plan Note (Addendum)
Patient states he feels much better since his last OV. He has occasional dizziness but that has improved. He denies any SOB, chest pain, headaches or leg swelling. No JVD, irregular rhythm, murmur or LE edema on exam. On patient's recent ECHO on 2/4, EF has recovered to 45-55% but still with global hypokinesis, mild LVD and G1DD. LV thrombus has since resolved while patient was on Eliquis. His Digoxin and Eliquis was discontinued by Dr. Gala Romney. Patient will continue GDMT.   Plan: --Refilled coreg 12.5 mg twice daily --Refilled lasix 40 mg PRN  --Continue spironolactone 25 mg daily --Continue Entresto 97-103 mg twice daily --Continue Farxiga 10 mg daily --Follow up in 3 weeks

## 2021-02-17 NOTE — Assessment & Plan Note (Signed)
LV thrombus resolved on recent ECHO. Eliquis was discontinued by Dr. Gala Romney.  --Continue ASA 81 mg daily

## 2021-02-17 NOTE — Assessment & Plan Note (Signed)
Patient's lifeVest was discontinued 2 months ago after ECHO on 2/4 showed recovery of EF to 45-50%. Patient continues to deny chest pain, SOB or palpitations. States he has had some occasional dizziness but feels better overall.  --Continue to follow up with cardiology

## 2021-02-17 NOTE — Patient Instructions (Addendum)
Thank you, Mr.Leverett [name] for allowing [place] to provide your care today. Today we discussed your blood pressure and heart failure. Continue to take your blood pressure medications and report blood pressures at home with any symptoms you experience. Make sure to bring your BP log with you to your next appt. Also make sure to bring your BP machine with you to clinic.   We will refer you again for a colonoscopy once you get your Medicaid approved.   I have ordered the following labs for you:  Lab Orders     BMP8+Anion Gap   I will call if any are abnormal. All of your labs can be accessed through "My Chart".  Please follow-up in 3 weeks.  Should you have any questions or concerns please call the internal medicine clinic at [phone].    Sharrell Ku, MD, MPH [facility]   My Chart Access: [email or web address]   If you have not already done so, please get your COVID 19 vaccine  To schedule an appointment for a COVID vaccine choice any of the following: Go to [email or web address]   Go to [email or web address]                  Call [phone]                                     Call [phone] and select Option 2

## 2021-02-17 NOTE — Progress Notes (Signed)
   CC: Follow up  HPI:  [name] is a 60 y.o. M with PMH as below who presents for a follow up on his chronic medical problems.  Please see problem based charting for evaluation, assessment and plan.  Past Medical History:  Diagnosis Date  . CHF (congestive heart failure) (HCC)   . Dyspnea   . History of hip replacement   . Hypertension     Review of Systems:  Constitutional: Negative for fatigue Eyes: Negative for visual changes Respiratory: Negative for shortness of breath  Cardiac: Negative for chest pain, palpitations or leg swelling Neuro: Positive for occasional dizziness. Negative for headache or weakness  Physical Exam: General: Pleasant, well-appearing elderly man. No acute distress. Neck: Supple. No JVD.  Cardiac: RRR. No murmurs, rubs or gallops.  No LE edema Respiratory: Lungs CTAB. No wheezing or crackles. Abdominal: Soft, symmetric and non tender. Normal BS. Skin: Warm, dry and intact without rashes or lesions Neuro: A&O x 3. Moves all extremities  Vitals:   02/17/21 1324 02/17/21 1327  BP: (!) 152/84 140/81  Pulse: 85   Temp: 98.4 F (36.9 C)   TempSrc: Oral   Weight: 292 lb 11.2 oz (132.8 kg)   Height: 6\' 3"  (1.905 m)     Assessment & Plan:   See Encounters Tab for problem based charting.  Patient discussed with Dr. , MD, MPH

## 2021-02-17 NOTE — Assessment & Plan Note (Signed)
Patient with initial BP of 152/84 with repeat BP improved to 140/81. Pt states his sBP is usually in the 110s-120s at home. Recently, he has been taking care of his 93-yo mother which has been stressful and he has not been able to get much sleep at her house. He reports feeling well in general besides the occasional dizziness. He is currently on coreg and PRN lasix.  Patient is advised to keep a daily BP log and bring the log plus BP machine to his next office visit.  Plan: --Refilled coreg 12.5 mg BID --Continue PRN lasix 40 mg --Follow up BMP --BP check at next OV in 3 weeks.  --

## 2021-02-17 NOTE — Assessment & Plan Note (Signed)
Stable. Started on low dose ASA by cardiologist after Eliquis was discontinued.  --Continue ASA 81 mg daily --Continue atorvastatin 40 mg daily

## 2021-02-18 ENCOUNTER — Encounter: Payer: Self-pay | Admitting: Student

## 2021-02-18 LAB — BMP8+ANION GAP
Anion Gap: 18 mmol/L (ref 10.0–18.0)
BUN/Creatinine Ratio: 15 (ref 10–24)
BUN: 19 mg/dL (ref 8–27)
CO2: 21 mmol/L (ref 20–29)
Calcium: 9.7 mg/dL (ref 8.6–10.2)
Chloride: 98 mmol/L (ref 96–106)
Creatinine, Ser: 1.27 mg/dL (ref 0.76–1.27)
Glucose: 95 mg/dL (ref 65–99)
Potassium: 5 mmol/L (ref 3.5–5.2)
Sodium: 137 mmol/L (ref 134–144)
eGFR: 65 mL/min/{1.73_m2} (ref >59)

## 2021-02-20 ENCOUNTER — Telehealth (HOSPITAL_COMMUNITY): Payer: Self-pay

## 2021-02-20 NOTE — Telephone Encounter (Signed)
Received a fax requesting medical records from [employer]. Records were successfully faxed to: [phone] ,which was the number provided.. Medical request form will be scanned into patients chart.  ° °

## 2021-02-21 NOTE — Progress Notes (Signed)
Internal Medicine Clinic Attending  Case discussed with Dr. Amponsah  At the time of the visit.  We reviewed the resident's history and exam and pertinent patient test results.  I agree with the assessment, diagnosis, and plan of care documented in the resident's note.  

## 2021-02-24 ENCOUNTER — Other Ambulatory Visit: Payer: Self-pay | Admitting: Student

## 2021-02-26 NOTE — Telephone Encounter (Signed)
Medication requested (folic acid) from [facility] [address] was sent to [facility] on 02/17/21. Call to pt to confirm pharmacy-no answer, message left on recorder.Kingsley Spittle Cassady4/13/202212:02 PM

## 2021-02-26 NOTE — Telephone Encounter (Signed)
  furosemide (LASIX) 40 MG tablet,   spironolactone (ALDACTONE) 25 MG tablet  refill request @  [id number] - [place], [place] - [address] Phone:  [phone]  Fax:  [phone]

## 2021-02-26 NOTE — Telephone Encounter (Signed)
Called pt - no answer; left message to call the office . 

## 2021-03-21 ENCOUNTER — Other Ambulatory Visit (HOSPITAL_COMMUNITY): Payer: Self-pay | Admitting: Internal Medicine

## 2021-03-21 ENCOUNTER — Other Ambulatory Visit (HOSPITAL_COMMUNITY): Payer: Self-pay

## 2021-03-24 ENCOUNTER — Telehealth (HOSPITAL_COMMUNITY): Payer: Self-pay | Admitting: *Deleted

## 2021-03-24 NOTE — Telephone Encounter (Signed)
Pt left VM stating Dr.Bensimhon told him not to attend a wedding after his procedure but he wants to know if it is possible for him to do so now. There is a wedding pt wants to attend at the end of the month.  Routed to Dr.Bensimhon for advice

## 2021-03-31 ENCOUNTER — Other Ambulatory Visit (HOSPITAL_COMMUNITY): Payer: Self-pay | Admitting: Internal Medicine

## 2021-03-31 ENCOUNTER — Other Ambulatory Visit (HOSPITAL_COMMUNITY): Payer: Self-pay

## 2021-03-31 ENCOUNTER — Other Ambulatory Visit: Payer: Self-pay | Admitting: Student

## 2021-03-31 NOTE — Telephone Encounter (Signed)
Pt aware and thanked me for the call.  

## 2021-04-01 ENCOUNTER — Other Ambulatory Visit (HOSPITAL_COMMUNITY): Payer: Self-pay

## 2021-04-01 ENCOUNTER — Other Ambulatory Visit (HOSPITAL_COMMUNITY): Payer: Self-pay | Admitting: Internal Medicine

## 2021-04-03 ENCOUNTER — Other Ambulatory Visit (HOSPITAL_COMMUNITY): Payer: Self-pay | Admitting: Internal Medicine

## 2021-04-03 ENCOUNTER — Other Ambulatory Visit (HOSPITAL_COMMUNITY): Payer: Self-pay

## 2021-04-04 ENCOUNTER — Other Ambulatory Visit (HOSPITAL_COMMUNITY): Payer: Self-pay

## 2021-04-04 MED ORDER — SACUBITRIL-VALSARTAN 97-103 MG PO TABS
1.0000 | ORAL_TABLET | Freq: Two times a day (BID) | ORAL | 11 refills | Status: DC
  Filled 2021-04-04 (×2): qty 60, 30d supply, fill #0

## 2021-04-07 ENCOUNTER — Telehealth (HOSPITAL_COMMUNITY): Payer: Self-pay | Admitting: Pharmacy Technician

## 2021-04-07 ENCOUNTER — Other Ambulatory Visit (HOSPITAL_COMMUNITY): Payer: Self-pay

## 2021-04-07 NOTE — Telephone Encounter (Signed)
Patient Advocate Encounter   Received notification from [facility] that prior authorization for Paul [name] is required.   PA submitted on CoverMyMeds Key  [id number] Status is pending   Will continue to follow.

## 2021-04-07 NOTE — Telephone Encounter (Signed)
Advanced Heart Failure Patient Advocate Encounter  Prior Authorization for Sherryll Burger has been approved.    PA# [id number] Effective dates: 03/24/21 through 04/07/22  Patients co-pay is $0 (90 days)  Archer Asa, CPhT

## 2021-04-16 ENCOUNTER — Encounter (HOSPITAL_COMMUNITY): Payer: Self-pay | Admitting: Internal Medicine

## 2021-04-16 ENCOUNTER — Other Ambulatory Visit: Payer: Self-pay

## 2021-04-16 ENCOUNTER — Telehealth (HOSPITAL_COMMUNITY): Payer: Self-pay | Admitting: Pharmacy Technician

## 2021-04-16 ENCOUNTER — Ambulatory Visit (HOSPITAL_COMMUNITY)
Admission: RE | Admit: 2021-04-16 | Discharge: 2021-04-16 | Disposition: A | Discharge status: Home / Self Care | Payer: Medicaid Other | Source: Ambulatory Visit | Attending: Internal Medicine | Admitting: Internal Medicine

## 2021-04-16 ENCOUNTER — Other Ambulatory Visit (HOSPITAL_COMMUNITY): Payer: Self-pay

## 2021-04-16 VITALS — BP 160/90 | HR 78 | Wt 295.8 lb

## 2021-04-16 DIAGNOSIS — Z79899 Other long term (current) drug therapy: Secondary | ICD-10-CM | POA: Insufficient documentation

## 2021-04-16 DIAGNOSIS — I110 Hypertensive heart disease with heart failure: Secondary | ICD-10-CM | POA: Insufficient documentation

## 2021-04-16 DIAGNOSIS — F1010 Alcohol abuse, uncomplicated: Secondary | ICD-10-CM | POA: Diagnosis not present

## 2021-04-16 DIAGNOSIS — Z8249 Family history of ischemic heart disease and other diseases of the circulatory system: Secondary | ICD-10-CM | POA: Insufficient documentation

## 2021-04-16 DIAGNOSIS — E669 Obesity, unspecified: Secondary | ICD-10-CM | POA: Diagnosis not present

## 2021-04-16 DIAGNOSIS — Z86718 Personal history of other venous thrombosis and embolism: Secondary | ICD-10-CM | POA: Insufficient documentation

## 2021-04-16 DIAGNOSIS — M25552 Pain in left hip: Secondary | ICD-10-CM | POA: Insufficient documentation

## 2021-04-16 DIAGNOSIS — I5022 Chronic systolic (congestive) heart failure: Secondary | ICD-10-CM | POA: Insufficient documentation

## 2021-04-16 DIAGNOSIS — Z7901 Long term (current) use of anticoagulants: Secondary | ICD-10-CM | POA: Diagnosis not present

## 2021-04-16 DIAGNOSIS — I5082 Biventricular heart failure: Secondary | ICD-10-CM | POA: Insufficient documentation

## 2021-04-16 DIAGNOSIS — I10 Essential (primary) hypertension: Secondary | ICD-10-CM

## 2021-04-16 DIAGNOSIS — Z7982 Long term (current) use of aspirin: Secondary | ICD-10-CM | POA: Diagnosis not present

## 2021-04-16 DIAGNOSIS — I2510 Atherosclerotic heart disease of native coronary artery without angina pectoris: Secondary | ICD-10-CM | POA: Insufficient documentation

## 2021-04-16 DIAGNOSIS — M25551 Pain in right hip: Secondary | ICD-10-CM | POA: Diagnosis not present

## 2021-04-16 LAB — BASIC METABOLIC PANEL
Anion gap: 7 (ref 5–15)
BUN: 13 mg/dL (ref 6–20)
CO2: 27 mmol/L (ref 22–32)
Calcium: 9.2 mg/dL (ref 8.9–10.3)
Chloride: 101 mmol/L (ref 98–111)
Creatinine, Ser: 1.11 mg/dL (ref 0.61–1.24)
GFR, Estimated: >60 mL/min (ref >60)
Glucose, Bld: 102 mg/dL — ABNORMAL HIGH (ref 70–99)
Potassium: 4.5 mmol/L (ref 3.5–5.1)
Sodium: 135 mmol/L (ref 135–145)

## 2021-04-16 LAB — BRAIN NATRIURETIC PEPTIDE: B Natriuretic Peptide: 539.6 pg/mL — ABNORMAL HIGH (ref 0.0–100.0)

## 2021-04-16 MED ORDER — SACUBITRIL-VALSARTAN 97-103 MG PO TABS: 1.0000 | ORAL_TABLET | Freq: Two times a day (BID) | ORAL | 11 refills | Status: DC

## 2021-04-16 MED ORDER — DAPAGLIFLOZIN PROPANEDIOL 10 MG PO TABS: 10.0000 mg | ORAL_TABLET | Freq: Every day | ORAL | 11 refills | Status: DC

## 2021-04-16 NOTE — Progress Notes (Signed)
Medication Samples have been provided to the patient.  Drug name: Marcelline Deist       Strength: 10mg         Qty: 2  LOT:  Exp.Date: 09/16/2023  Dosing instructions: TAKE 1 TABLET BY MOUTH DAILY.   The patient has been instructed regarding the correct time, dose, and frequency of taking this medication, including desired effects and most common side effects.   Joscelyne Renville R Dwayn Moravek 11:54 AM 04/16/2021

## 2021-04-16 NOTE — Telephone Encounter (Signed)
Patient Advocate Encounter   Received notification from [facility] that prior authorization for Paul [name] is required.   PA submitted on CoverMyMeds Key  [id number] Status is pending   Will continue to follow.

## 2021-04-16 NOTE — Patient Instructions (Addendum)
EKG done today.  Labs done today. We will contact you only if your labs are abnormal.  START Farxiga 10mg  (1 tablet) by mouth daily.   Your has been refilled.   No other medication changes were made. Please continue all current medications as prescribed.  Your physician recommends that you schedule a follow-up appointment soon to see our clinic pharmacist and in 6 months with Dr. Sherryll Burger with an echo prior to your exam.   If you have any questions or concerns before your next appointment please send Leory Plowman a message through [facility] or call our office at [phone].    TO LEAVE A MESSAGE FOR THE NURSE SELECT OPTION 2, PLEASE LEAVE A MESSAGE INCLUDING: . YOUR NAME . DATE OF BIRTH . CALL BACK NUMBER . REASON FOR CALL**this is important as we prioritize the call backs  YOU WILL RECEIVE A CALL BACK THE SAME DAY AS LONG AS YOU CALL BEFORE 4:00 PM   Do the following things EVERYDAY: 1) Weigh yourself in the morning before breakfast. Write it down and keep it in a log. 2) Take your medicines as prescribed 3) Eat low salt foods--Limit salt (sodium) to 2000 mg per day.  4) Stay as active as you can everyday 5) Limit all fluids for the day to less than 2 liters   At the Advanced [facility] Failure Clinic, you and your health needs are our priority. As part of our continuing mission to provide you with exceptional heart care, we have created designated Provider Care Teams. These Care Teams include your primary Cardiologist (physician) and Advanced Practice Providers (APPs- Physician Assistants and Nurse Practitioners) who all work together to provide you with the care you need, when you need it.   You may see any of the following providers on your designated Care Team at your next follow up: [phone] Dr Marland Kitchen . Dr Arvilla Meres . Marca Ancona, NP . Tonye Becket, PA . Robbie Lis, PharmD   Please be sure to bring in all your medications bottles to every appointment.

## 2021-04-16 NOTE — Progress Notes (Signed)
ADVANCED HEART FAILURE CLINIC NOTE  PCP: Steffanie Rainwater, MD Primary Cardiologist: DB  HPI:  Paul [name] is a 60 y/o male with obesity, HTN and ETOH use. Admitted in 11/21 with new onset HF.   Echo biventricular heart failure. LVEF 20-25% w/ global hypokinesis, mild LVH. RV systolic function moderately reduced. There is an LV apical thrombus measuring 2.4 cm x 1.3 cm.  - cMRI Severe LVE EF 22%. 1 cm x 1.4 cm mobile apical thrombus. Mild RVE with moderate reduction in RVEF 31%. Combination of mid/basal hypertrophy poor nulling of myocardium, elevated ECV and mildly elevated T1 suggests possibility of infiltrative cardiomyopathy.   - PYP 12/21 negative  - LHC 10/17/20   Prox LAD to Mid LAD lesion is 30% stenosed.  Mid LAD lesion is 30% stenosed.  Dist LAD lesion is 30% stenosed. Apical LAD with diffuse disease  1st Diag lesion is 70% stenosed  Developed NSVT and LifeVest placed.  Echo 2/22 EF 50% RV normal. Personally reviewed  Here for f/u. Says he feels ok but not as good as he did before. Was walking regularly but developed severe hip pain (had both hips replaced). Denies SOB with going to store. Limited by hip pain. No edema, orthopnea or PND. Following BP at home SBP 120-135. Out of Entresto for > 1 week. Takes lasix when needed. Several times per week if weight up.   ROS: All systems negative except as listed in HPI, PMH and Problem List.  SH:  Social History   Socioeconomic History  . Marital status: Divorced    Spouse name: Not on file  . Number of children: Not on file  . Years of education: Not on file  . Highest education level: Not on file  Occupational History  . Not on file  Tobacco Use  . Smoking status: Never Smoker  . Smokeless tobacco: Never Used  Vaping Use  . Vaping Use: Never used  Substance and Sexual Activity  . Alcohol use: Yes  . Drug use: Never  . Sexual activity: Not on file  Other Topics Concern  . Not on file  Social History  Narrative  . Not on file   Social Determinants of Health   Financial Resource Strain: Not on file  Food Insecurity: Not on file  Transportation Needs: Not on file  Physical Activity: Not on file  Stress: Not on file  Social Connections: Not on file  Intimate Partner Violence: Not on file    FH:  Family History  Problem Relation Age of Onset  . Pulmonary Hypertension Mother   . Hypertension Mother   . CAD Father        s/p CABG x 4  . Heart failure Father   . Heart failure Sister   . Hypertension Sister     Past Medical History:  Diagnosis Date  . CHF (congestive heart failure) (HCC)   . Dyspnea   . History of hip replacement   . Hypertension     Current Outpatient Medications  Medication Sig Dispense Refill  . aspirin EC 81 MG tablet Take 1 tablet (81 mg total) by mouth daily. Swallow whole. 90 tablet 3  . atorvastatin (LIPITOR) 40 MG tablet Take 1 tablet (40 mg total) by mouth daily. 90 tablet 1  . carvedilol (COREG) 12.5 MG tablet Take 1 tablet (12.5 mg total) by mouth 2 (two) times daily with a meal. 120 tablet 2  . folic acid (FOLVITE) 1 MG tablet TAKE 1 TABLET BY MOUTH  EVERY DAY 30 tablet 1  . furosemide (LASIX) 40 MG tablet TAKE 1 TABLET (40 MG TOTAL) BY MOUTH AS NEEDED. 30 tablet 1  . Multiple Vitamins-Minerals (CENTRUM SILVER 50+MEN PO) Take 1 tablet by mouth daily in the afternoon.    . sacubitril-valsartan (ENTRESTO) 97-103 MG Take 1 tablet by mouth 2 (two) times daily. 60 tablet 11  . spironolactone (ALDACTONE) 25 MG tablet Take 1 tablet (25 mg total) by mouth daily. 60 tablet 2  . thiamine 100 MG tablet TAKE 1 TABLET BY MOUTH EVERY DAY 30 tablet 0   No current facility-administered medications for this encounter.    Vitals:   04/16/21 1101  BP: (!) 160/90  Pulse: 78  SpO2: 97%  Weight: 134.2 kg (295 lb 12.8 oz)    PHYSICAL EXAM:  General:  Well appearing. No resp difficulty HEENT: normal Neck: supple. no JVD. Carotids 2+ bilat; no bruits. No  lymphadenopathy or thryomegaly appreciated. Cor: PMI nondisplaced. Regular rate & rhythm. No rubs, gallops or murmurs. Lungs: clear Abdomen: obese soft, nontender, nondistended. No hepatosplenomegaly. No bruits or masses. Good bowel sounds. Extremities: no cyanosis, clubbing, rash, edema Neuro: alert & orientedx3, cranial nerves grossly intact. moves all 4 extremities w/o difficulty. Affect pleasant   ASSESSMENT & PLAN:  1. Chronic Systolic Heart Failure - Admitted 11/21 with acute systolic HF. Markedly hypertensive in ED. HS trop 113>>93. EKG sinus tach w/ PACs - Echo 11/21 LVEF 20-25% w/ global HK, mild LVH and moderately reduced RV (no prior study for comparison) - cMRI Severe LVE EF 22%. 1 cm x 1.4 cm mobile apical thrombus. Mild RVE with moderate reduction in RVEF 31%. Combination of mid/basal hypertrophy poor nulling of myocardium, elevated ECV and mildly elevated T1 suggests possibility of infiltrative cardiomyopathy.  - PYP 12/21 negative. - Cath with mild CAD and elevated filling pressures. - Echo 12/20/20 EF 50% RV normal. - NYHA class II, euvolemic on exam. Lasix prn only.  - Continue carvedilol 12.5 bid. - Restart entresto 97-103 mg bid. - Started Farxiga 10  -Continuespiro 25 mg daily.   - Completed DAPA trial  - Labs today - Repeat echo in 4 months - PharmD to see regarding med assistance  2. CAD, mild non-obstructive - on statin. On ASA 81 - LHC 10/17/20   Prox LAD to Mid LAD lesion is 30% stenosed.  Mid LAD lesion is 30% stenosed.  Dist LAD lesion is 30% stenosed. Apical LAD with diffuse disease  1st Diag lesion is 70% stenosed. -  No s/s angina  3. LV Thrombus  - Resolved. Off Eliquis   4. Hypertension  - BP looks good at home. Elevated here.  - Restart Entresto - Encouraged to follow closely  5. ETOH Abuse  - Still drinking 2-3 drinks a few times per week.  - Encouraged to limit intake as much as possible  6. Probable OSA - referred for sleep  study but didn't get it    Arvilla Meres, MD  11:27 AM

## 2021-04-16 NOTE — Addendum Note (Signed)
Encounter addended by: Chinita Pester, CMA on: 04/16/2021 11:55 AM  Actions taken: Diagnosis association updated, Charge Capture section accepted, Order list changed, Clinical Note Signed

## 2021-04-23 ENCOUNTER — Other Ambulatory Visit (HOSPITAL_COMMUNITY): Payer: Self-pay

## 2021-04-29 NOTE — Telephone Encounter (Signed)
Advanced Heart Failure Patient Advocate Encounter  Prior Authorization for Paul [name] has been approved.    PA# [id number] Effective dates: 04/16/21 through 04/16/22  Archer Asa, CPhT

## 2021-05-09 ENCOUNTER — Other Ambulatory Visit: Payer: Self-pay | Admitting: Student

## 2021-05-09 ENCOUNTER — Other Ambulatory Visit: Payer: Self-pay | Admitting: Internal Medicine

## 2021-05-14 ENCOUNTER — Other Ambulatory Visit (HOSPITAL_COMMUNITY): Payer: Self-pay | Admitting: *Deleted

## 2021-05-14 DIAGNOSIS — I5022 Chronic systolic (congestive) heart failure: Secondary | ICD-10-CM

## 2021-05-14 MED ORDER — FUROSEMIDE 40 MG PO TABS: 40.0000 mg | ORAL_TABLET | ORAL | 4 refills | Status: DC | PRN

## 2021-05-21 NOTE — Progress Notes (Signed)
PCP: Steffanie Rainwater, MD Primary Cardiologist: Dr. Gala Romney  HPI:  Paul [name] is a 60 y/o male with obesity, HTN and ETOH use. Admitted in 11/21 with new onset HF.   Echo biventricular heart failure. LVEF 20-25% w/ global hypokinesis, mild LVH. RV systolic function moderately reduced. There was an LV apical thrombus measuring 2.4 cm x 1.3 cm.   cMRI with severe LVE EF 22%. 1 cm x 1.4 cm mobile apical thrombus. Mild RVE with moderate reduction in RVEF 31%. Combination of mid/basal hypertrophy poor nulling of myocardium, elevated ECV and mildly elevated T1 suggests possibility of infiltrative cardiomyopathy.   PYP 12/21 negative. R/LHC 10/17/20 with mild CAD and severe NICM with biventricular dysfunction EF 20% and elevated filling pressures. Developed NSVT and LifeVest placed.  Repeat ECHO 2/22 with improved EF 50% and RV normal.   He was last seen by Dr. Gala Romney in HF clinic on 04/16/21. Stated he felt ok but not as good as he did before. He was walking regularly but developed severe hip pain (had both hips replaced). Denied SOB with going to store. Limited by hip pain. No edema, orthopnea or PND. Following BP at home SBP 120-135. Out of Entresto for > 1 week. Taking lasix when needed. Several times per week if weight up. BP 160/90 and HR 78.   Today he returns to HF clinic for pharmacist medication titration. At last visit with MD, he was restarted on Entresto 97/103 mg BID and started on Farxiga 10 mg daily. Overall, he is feeling ok. At times, he feels fatigued and unable to do the things he wishes to do to help his family. He denies chest pain or palpitations. His breathing has been good, no longer wheezing. Denies shortness of breath, PND, orthopnea. Weight at home has been around 277 lbs. No LE edema, no JVD, and lungs clear. He has been prescribed furosemide 40 mg daily PRN but he reports he has been taking furosemide 40 mg daily and occasionally will take 80 mg on days where he feels  fluid overloaded (or if his weight >280 lbs). He has been slowly picking back up his exercise and will walk about a mile outside if it is not too hot out. He says his appetite is good. He checks his BP and HR at home and these have been stable ~ 120/60s and HR 70s.   HF Medications: Carvedilol 12.5 mg BID Entresto 97/103 mg BID Spironolactone 25 mg daily Farxiga 10 mg daily Furosemide 40 mg daily PRN  Has the patient been experiencing any side effects to the medications prescribed?  no  Does the patient have any problems obtaining medications due to transportation or finances?   Yes - has [employer]; receives [employer] through [employer]. States he does not need [place] patient assistance at this time.  Understanding of regimen: good Understanding of indications: good Potential of compliance: good Patient understands to avoid NSAIDs. Patient understands to avoid decongestants.    Pertinent Lab Values: Serum creatinine 1.39, BUN 18, Potassium 4.3, Sodium 134  Vital Signs: Weight: 287 (last clinic weight: 295 lbs) Blood pressure: 116/78 mmHg  Heart rate: 76 bpm   Assessment/Plan: 1. Chronic systolic heart failure - Admitted 11/21 with acute systolic HF. Markedly hypertensive in ED. HS trop 113>>93. EKG sinus tach w/ PACs - Echo 11/21 LVEF 20-25% w/ global HK, mild LVH and moderately reduced RV (no prior study for comparison)  - cMRI Severe LVE EF 22%. 1 cm x 1.4 cm mobile apical thrombus. Mild  RVE with moderate reduction in RVEF 31%. Combination of mid/basal hypertrophy poor nulling of myocardium, elevated ECV and mildly elevated T1 suggests possibility of infiltrative cardiomyopathy. - PYP 12/21 negative. - Cath with mild CAD and elevated filling pressures. - Echo 12/20/20 EF 50% RV normal. - NYHA class II, euvolemic on exam - Increase carvedilol to 18.75 mg BID - Continue Entresto 97-103 mg BID - Continue spironolactone 25 mg daily - Continue Farxiga 10 mg daily - Change  furosemide to 40 mg daily (can take extra 40 mg PRN edema) to better reflect how he's been taking this at home.   2. CAD, mild non-obstructive - on statin. On ASA 81 - LHC 10/17/20 Prox LAD to Mid LAD lesion is 30% stenosed. Mid LAD lesion is 30% stenosed. Dist LAD lesion is 30% stenosed. Apical LAD with diffuse disease 1st Diag lesion is 70% stenosed.  -  No s/s angina   3. LV Thrombus - Resolved. Off Eliquis    4. Hypertension  - Improved - Continue Entresto, spironolactone, and carvedilol as above - Encouraged to follow BP closely   5. ETOH Abuse - Still drinking 2-3 drinks a few times per week.  - Encouraged to limit intake as much as possible   6. Probable OSA - referred for sleep study but didn't get it  - discussed the benefits of sleep study and encouraged   Follow up with HF APP in 1 month   Sharen Hones, PharmD, BCPS Heart Failure Clinic Pharmacist [phone]

## 2021-05-22 ENCOUNTER — Encounter (HOSPITAL_COMMUNITY): Payer: Self-pay

## 2021-05-22 ENCOUNTER — Other Ambulatory Visit: Payer: Self-pay

## 2021-05-22 ENCOUNTER — Other Ambulatory Visit: Payer: Self-pay | Admitting: Internal Medicine

## 2021-05-22 ENCOUNTER — Ambulatory Visit (HOSPITAL_COMMUNITY)
Admission: RE | Admit: 2021-05-22 | Discharge: 2021-05-22 | Disposition: A | Discharge status: Home / Self Care | Payer: Medicaid Other | Source: Ambulatory Visit | Attending: Cardiology | Admitting: Cardiology

## 2021-05-22 VITALS — BP 116/78 | HR 76 | Wt 287.2 lb

## 2021-05-22 DIAGNOSIS — I5082 Biventricular heart failure: Secondary | ICD-10-CM

## 2021-05-22 DIAGNOSIS — Z6835 Body mass index (BMI) 35.0-35.9, adult: Secondary | ICD-10-CM | POA: Insufficient documentation

## 2021-05-22 DIAGNOSIS — I5022 Chronic systolic (congestive) heart failure: Secondary | ICD-10-CM | POA: Insufficient documentation

## 2021-05-22 DIAGNOSIS — F1010 Alcohol abuse, uncomplicated: Secondary | ICD-10-CM | POA: Diagnosis not present

## 2021-05-22 DIAGNOSIS — E669 Obesity, unspecified: Secondary | ICD-10-CM | POA: Diagnosis not present

## 2021-05-22 DIAGNOSIS — I110 Hypertensive heart disease with heart failure: Secondary | ICD-10-CM | POA: Insufficient documentation

## 2021-05-22 DIAGNOSIS — I428 Other cardiomyopathies: Secondary | ICD-10-CM | POA: Diagnosis not present

## 2021-05-22 DIAGNOSIS — I2510 Atherosclerotic heart disease of native coronary artery without angina pectoris: Secondary | ICD-10-CM | POA: Diagnosis not present

## 2021-05-22 LAB — BASIC METABOLIC PANEL
Anion gap: 7 (ref 5–15)
BUN: 18 mg/dL (ref 6–20)
CO2: 28 mmol/L (ref 22–32)
Calcium: 9.3 mg/dL (ref 8.9–10.3)
Chloride: 99 mmol/L (ref 98–111)
Creatinine, Ser: 1.39 mg/dL — ABNORMAL HIGH (ref 0.61–1.24)
GFR, Estimated: 58 mL/min — ABNORMAL LOW (ref >60)
Glucose, Bld: 95 mg/dL (ref 70–99)
Potassium: 4.3 mmol/L (ref 3.5–5.1)
Sodium: 134 mmol/L — ABNORMAL LOW (ref 135–145)

## 2021-05-22 MED ORDER — CARVEDILOL 12.5 MG PO TABS: 18.7500 mg | ORAL_TABLET | Freq: Two times a day (BID) | ORAL | 3 refills | Status: DC

## 2021-05-22 MED ORDER — FOLIC ACID 1 MG PO TABS: 1.0000 mg | ORAL_TABLET | Freq: Every day | ORAL | 1 refills | Status: DC

## 2021-05-22 MED ORDER — FUROSEMIDE 40 MG PO TABS: ORAL_TABLET | ORAL | 4 refills | Status: DC

## 2021-05-22 MED ORDER — THIAMINE HCL 100 MG PO TABS: 100.0000 mg | ORAL_TABLET | Freq: Every day | ORAL | 1 refills | Status: DC

## 2021-05-22 NOTE — Patient Instructions (Signed)
It was a pleasure seeing you today!  MEDICATIONS: -We are changing your medications today -Increase your carvedilol to 1.5 tablets (18.75 mg) TWICE daily -Call if you have questions about your medications.  LABS: -We will call you if your labs need attention.  NEXT APPOINTMENT: Return to clinic in 1 month with HF NP/PA.  In general, to take care of your heart failure: -Limit your fluid intake to 2 Liters (half-gallon) per day.   -Limit your salt intake to ideally 2-3 grams (2000-3000 mg) per day. -Weigh yourself daily and record, and bring that "weight diary" to your next appointment.  (Weight gain of 2-3 pounds in 1 day typically means fluid weight.) -The medications for your heart are to help your heart and help you live longer.   -Please contact us before stopping any of your heart medications.  Call the clinic at [phone] with questions or to reschedule future appointments.

## 2021-05-31 IMAGING — MR MR CARD MORPHOLOGY WO/W CM
45 of 48 series · 45 of 48 positions shown · IV contrast (gadavist)
Comparison: none

CLINICAL DATA: Cardiomyopathy

EXAM:
CARDIAC MRI
TECHNIQUE: The patient was scanned on a 1.5 Tesla Siemens magnet. A dedicated
cardiac coil was used. Functional imaging was done using Fiesta
sequences. [DATE], and 4 chamber views were done to assess for RWMA's.
Modified Merry Rose rule using a short axis stack was used to
calculate an ejection fraction on a dedicated work station using
Circle software. The patient received 10 cc of Gadavist After 10
minutes inversion recovery sequences were used to assess for
infiltration and scar tissue.
CONTRAST:  Gadavist

[Series 4: [id number] · axial · 8.0mm · 1.56mm/px · 1 of 20 slices shown]
[im 1/20]
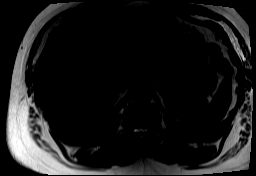

[Series 8: bSSFP · oblique · 8.0mm · 1.83mm/px · 1 of 25 slices shown (1 of 23)]
[im 1/25]
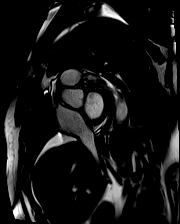

[Series 9: bSSFP · oblique · 8.0mm · 1.83mm/px · 1 of 25 slices shown (2 of 23)]
[im 1/25]
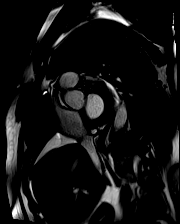

[Series 10: bSSFP · oblique · 8.0mm · 1.83mm/px · 1 of 25 slices shown (3 of 23)]
[im 1/25]
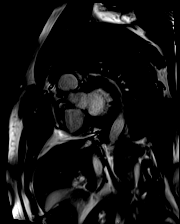

[Series 11: bSSFP · oblique · 8.0mm · 1.83mm/px · 1 of 25 slices shown (4 of 23)]
[im 1/25]
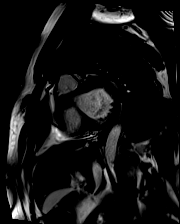

[Series 12: bSSFP · oblique · 8.0mm · 1.83mm/px · 1 of 25 slices shown (5 of 23)]
[im 1/25]
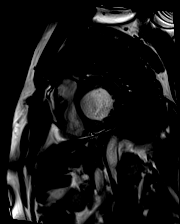

[Series 13: bSSFP · oblique · 8.0mm · 1.83mm/px · 1 of 25 slices shown (6 of 23)]
[im 1/25]
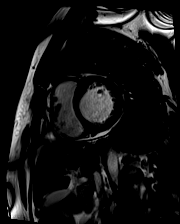

[Series 14: bSSFP · oblique · 8.0mm · 1.83mm/px · 1 of 25 slices shown (7 of 23)]
[im 1/25]
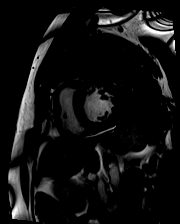

[Series 15: bSSFP · oblique · 8.0mm · 1.83mm/px · 1 of 25 slices shown (8 of 23)]
[im 1/25]
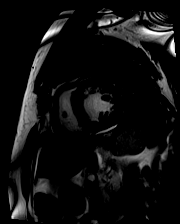

[Series 16: bSSFP · oblique · 8.0mm · 1.83mm/px · 1 of 25 slices shown (9 of 23)]
[im 1/25]
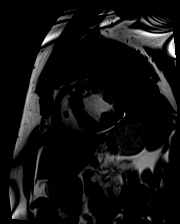

[Series 17: bSSFP · oblique · 8.0mm · 1.83mm/px · 1 of 25 slices shown (10 of 23)]
[im 1/25]
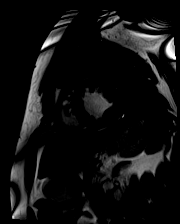

[Series 18: bSSFP · oblique · 8.0mm · 1.83mm/px · 1 of 25 slices shown (11 of 23)]
[im 1/25]
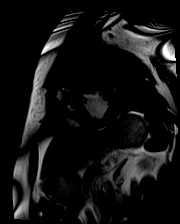

[Series 19: bSSFP · oblique · 8.0mm · 1.83mm/px · 1 of 25 slices shown (12 of 23)]
[im 1/25]
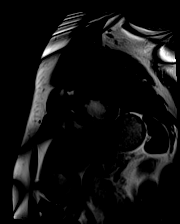

[Series 20: bSSFP · oblique · 8.0mm · 1.83mm/px · 1 of 25 slices shown (13 of 23)]
[im 1/25]
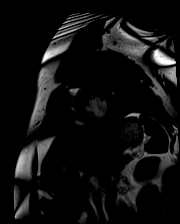

[Series 21: bSSFP · oblique · 8.0mm · 1.83mm/px · 1 of 25 slices shown (14 of 23)]
[im 1/25]
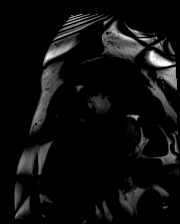

[Series 22: bSSFP · oblique · 8.0mm · 1.83mm/px · 1 of 25 slices shown (15 of 23)]
[im 1/25]
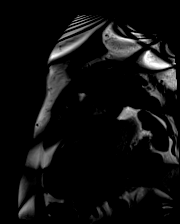

[Series 23: bSSFP · oblique · 8.0mm · 1.83mm/px · 1 of 25 slices shown (16 of 23)]
[im 1/25]
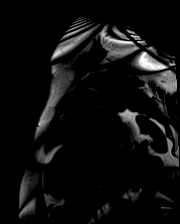

[Series 24: bSSFP · oblique · 8.0mm · 1.83mm/px · 1 of 25 slices shown (17 of 23)]
[im 1/25]
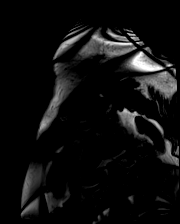

[Series 25: bSSFP · oblique · 8.0mm · 1.83mm/px · 1 of 25 slices shown (18 of 23)]
[im 1/25]
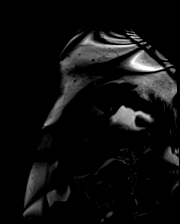

[Series 26: bSSFP · oblique · 6.0mm · 1.41mm/px · 1 of 25 slices shown (19 of 23)]
[im 1/25]
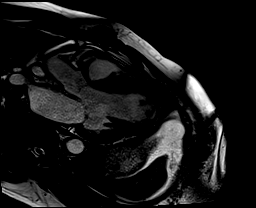

[Series 27: bSSFP · coronal · 6.0mm · 0.91mm/px · 1 of 25 slices shown (20 of 23)]
[im 1/25]
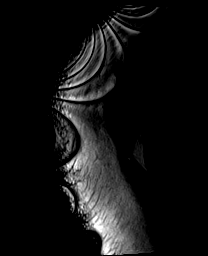

[Series 28: (id)_trufi · oblique · 8.0mm · 2.19mm/px · 1 of 9 slices shown]
[im 1/9]
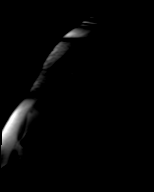

[Series 29: (id)_trufi_moco · oblique · 8.0mm · 2.19mm/px · 1 of 9 slices shown]
[im 1/9]
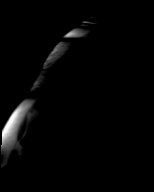

[Series 30: (id)_trufi_moco_t2 · oblique · 8.0mm · 2.19mm/px · 1 of 3 slices shown]
[im 1/3]
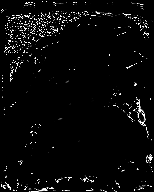

[Series 32: bSSFP · coronal · 6.0mm · 1.64mm/px · 1 of 25 slices shown (21 of 23)]
[im 1/25]
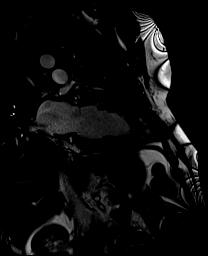

[Series 33: (id)_long_t1 · oblique · 8.0mm · 1.56mm/px · 1 of 24 slices shown]
[im 1/24]
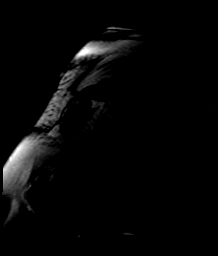

[Series 34: (id)_long_t1_moco · oblique · 8.0mm · 1.56mm/px · 1 of 24 slices shown]
[im 1/24]
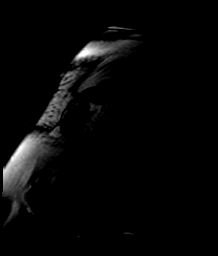

[Series 35: (id)_long_t1_moco_t1 · oblique · 8.0mm · 1.56mm/px · 1 of 6 slices shown]
[im 1/6]
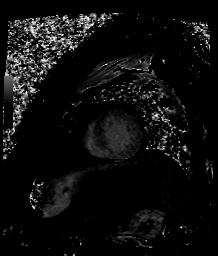

[Series 37: bSSFP · axial · 6.0mm · 1.56mm/px · 1 of 25 slices shown (22 of 23)]
[im 1/25]
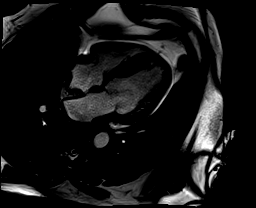

[Series 38: pre short axis · oblique · non-contrast · 8.0mm · 2.62mm/px · 1 of 10 slices shown (1 of 6)]
[im 1/10]
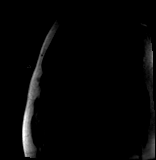

[Series 39: pre short axis · oblique · non-contrast · 8.0mm · 2.62mm/px · 1 of 10 slices shown (2 of 6)]
[im 1/10]
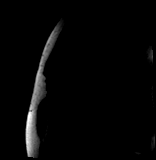

[Series 40: pre short axis · oblique · non-contrast · 8.0mm · 2.62mm/px · 1 of 10 slices shown (3 of 6)]
[im 1/10]
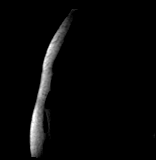

[Series 41: pre short axis · oblique · non-contrast · 8.0mm · 2.62mm/px · 1 of 10 slices shown (4 of 6)]
[im 1/10]
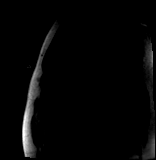

[Series 42: pre short axis · oblique · non-contrast · 8.0mm · 2.62mm/px · 1 of 10 slices shown (5 of 6)]
[im 1/10]
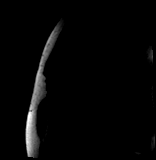

[Series 43: pre short axis · oblique · non-contrast · 8.0mm · 2.62mm/px · 1 of 10 slices shown (6 of 6)]
[im 1/10]
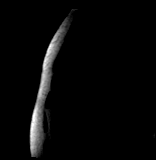

[Series 44: rest short axis · oblique · 8.0mm · 2.62mm/px · 1 of 80 slices shown (1 of 6)]
[im 1/80]
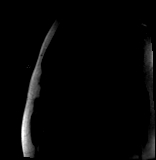

[Series 45: rest short axis · oblique · 8.0mm · 2.62mm/px · 1 of 80 slices shown (2 of 6)]
[im 1/80]
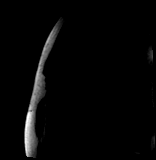

[Series 46: rest short axis · oblique · 8.0mm · 2.62mm/px · 1 of 80 slices shown (3 of 6)]
[im 1/80]
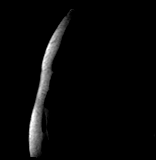

[Series 47: rest short axis · oblique · 8.0mm · 2.62mm/px · 1 of 80 slices shown (4 of 6)]
[im 1/80]
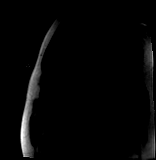

[Series 48: rest short axis · oblique · 8.0mm · 2.62mm/px · 1 of 80 slices shown (5 of 6)]
[im 1/80]
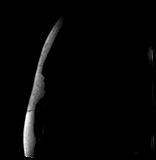

[Series 49: rest short axis · oblique · 8.0mm · 2.62mm/px · 1 of 80 slices shown (6 of 6)]
[im 1/80]
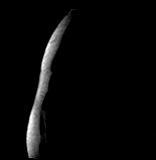

[Series 50: bSSFP · coronal · 6.0mm · 1.41mm/px · 1 of 25 slices shown (23 of 23)]
[im 1/25]
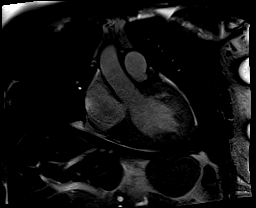

[Series 51: aortic valve cine · oblique · 6.0mm · 1.41mm/px · 1 of 25 slices shown]
[im 1/25]
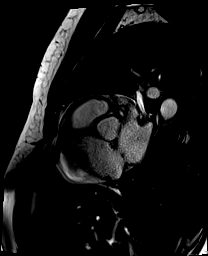

[Series 52: cine rvit · coronal · 6.0mm · 1.41mm/px · 1 of 25 slices shown]
[im 1/25]
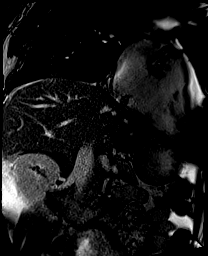

[Series 53: cine rvot · sagittal · 6.0mm · 1.56mm/px · 1 of 25 slices shown]
[im 1/25]
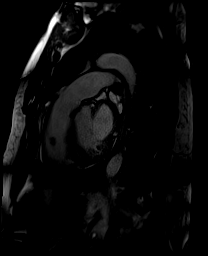

[45 of 48 positions shown; findings below may reference images not displayed]

FINDINGS: Moderate LAE. Normal RA/RV. Severe LVE. Trivial posterior lateral
pericardial effusion No ASD/PFO. Normal aortic root 3.5 cm There is
severe LVE with moderate hypertrophy of the basal and mid septum 16
mm. There is diffuse hypokinesis with akinesis of the apex and
distal septum The quantitative EF was 22% Delayed enhancement images
showed poor nulling with distal septal and apical infarct There is a
1 cm x 1.4 cm apical thrombus with some mobility. The RV is mildly
dilated with hypokinesis RVEF is moderately reduced at 31% (EDV 181
cc ESV 125 cc SV 55 cc)

Parametric measures as follows

T1: Global 5634 msec mildly elevated

T2 49.5 msec normal range

ECV: Elevated 39%
IMPRESSION: 1. Severe LVE with global hypokinesis and septal / apical akinesis
EF 22%

2. Delayed enhancement images show poor nulling of myocardium with
distal septal and apical infarct

3.  1 cm x 1.4 cm mobile apical thrombus

4.  Mild RVE with moderate reduction in RVEF 31%

5. Combination of mid/basal hypertrophy poor nulling of myocardium,
elevated ECV and mildly elevated T1 suggests possibility of
infiltrative cardiomyopathy

6.  Trivial posterior lateral pericardial effusion

Aloha Pulgarin

## 2021-06-12 ENCOUNTER — Ambulatory Visit (INDEPENDENT_AMBULATORY_CARE_PROVIDER_SITE_OTHER): Payer: Medicaid Other | Admitting: Internal Medicine

## 2021-06-12 ENCOUNTER — Other Ambulatory Visit: Payer: Self-pay

## 2021-06-12 DIAGNOSIS — I5082 Biventricular heart failure: Secondary | ICD-10-CM | POA: Diagnosis not present

## 2021-06-12 DIAGNOSIS — I5022 Chronic systolic (congestive) heart failure: Secondary | ICD-10-CM

## 2021-06-12 MED ORDER — FUROSEMIDE 40 MG PO TABS
ORAL_TABLET | ORAL | 4 refills | Status: DC
Start: 2021-06-12 — End: 2021-12-08

## 2021-06-12 NOTE — Patient Instructions (Signed)
[name], it was a pleasure seeing you today!  Today we discussed: Medication for your heart- today we chatted about your heart medications and how you have been tolerating the increased Coreg.  Glad to hear you are doing great!  Please continue taking your blood pressure and logging it.  Please bring that in October to review then.  You are doing a great job with maintaining your fluid with lasix and continue with your diet changes including reducing salt.  We will schedule you to return in October.   I have ordered the following labs today:  Lab Orders  No laboratory test(s) ordered today     Tests ordered today:  none  Referrals ordered today:   Referral Orders  No referral(s) requested today     I have ordered the following medication/changed the following medications:   Stop the following medications: There are no discontinued medications.   Start the following medications: No orders of the defined types were placed in this encounter.    Follow-up: 3 months   Please make sure to arrive 15 minutes prior to your next appointment. If you arrive late, you may be asked to reschedule.   We look forward to seeing you next time. Please call our clinic at [phone] if you have any questions or concerns. The best time to call is Monday-Friday from 9am-4pm, but there is someone available 24/7. If after hours or the weekend, call the main hospital number and ask for the Internal Medicine Resident On-Call. If you need medication refills, please notify your pharmacy one week in advance and they will send [place] a request.  Thank you for letting us take part in your care. Wishing you the best!  Thank you, Dr. Sloan Leiter

## 2021-06-12 NOTE — Assessment & Plan Note (Addendum)
Patient presents for follow-up of his systolic HF with biventricular dysfunction, his most recent echo 2/4 showed that his EF had recovered to 45-55% but still with global hypokinesis, mild LVD and G1DD.  His Coreg was increased to 18.75 BID qd earlier this month. States that he is tolerating that dosage ok.  He has had some decreased blood pressure as low as 95/54 with some associated light-headedness, but drank more water and felt better. He takes his Lasix BID but adds more doses on if he notices increased swelling in his feet.  He has been gradually increasing his exercise including starting to go to the gym to weight lift. Plan: -Coreg 18.75 BIDqd -Refilled Lasix 40 mg PRN -Continue spironolactone 25 mg daily -Continue Entresto 97-103 mg BID -Continue Farxiga 10 mg qd -Follow up in 3 months

## 2021-06-12 NOTE — Progress Notes (Signed)
   CC: follow-up for congestive heart failure  HPI:  [name] is a 60 y.o. with medical history as below presenting to [facility] for follow-up for congestive heart failure.  Please see problem-based list for further details, assessments, and plans.  Past Medical History:  Diagnosis Date   CHF (congestive heart failure) (HCC)    Dyspnea    History of hip replacement    Hypertension    Review of Systems:  As per HPI  Physical Exam:  Vitals:   06/12/21 1419  BP: 116/71  Pulse: 84  Temp: 98.4 F (36.9 C)  TempSrc: Oral  SpO2: 98%  Weight: 286 lb 9.6 oz (130 kg)  Height: 6\' 3"  (1.905 m)    General: well-developed, well-nourished HENT: NCAT, no scars Eyes: no scleral icterus, conjunctiva clear CV: normal rate, no murmurs Pulm: CTAB, normal pulmonary effort GI:No tenderness present, normal bowel sounds MSK: normal gait, no peripheral edema in lower extremities bilaterally Skin: No bruises or rashes Psych: normal mood and affect  Assessment & Plan:   See Encounters Tab for problem based charting.  Patient seen with Dr. 

## 2021-06-16 ENCOUNTER — Telehealth: Payer: Self-pay | Admitting: Licensed Clinical Social Worker

## 2021-06-16 NOTE — Telephone Encounter (Signed)
LCSW team received referral for f/u on disability per call pt made to [facility] pharmacist Megan. LCSW was able to reach pt at [phone]. I introduced self, role, reason for call. Let him know that I was covering for Braulio Conte who normally cover AHF clinic and whom he's spoken with previously. Confirmed home address, PCP and emergency contact. He states that he has a new PCP through the Internal Medicine clinic at [facility]. He was approved for [facility] and is grateful to have coverage. He is understandably feeling down since he is not able to do the daily things that he did previously without feeling of "brain fog" and fatigue. He inquires if there is anything that he should know about his disability claim or ways to speed up the process. He notes he has an appointment w/ Disability Determination physician upcoming.   LCSW shared that the process can be lengthy getting a determination. Things that normally took 60-90 days can now take even longer as Disability Determination Services has an more claims over the past two years along with COVID related delays. I noted on chart that the last ROI from [employer] was on 03/10/21. Since then he has seen both his PCP and HF clinic. I encouraged him to call [employer] today and request a new ROI be sent to [facility] so his clinicals are up to date. I also encouraged him to reach out to [employer] every two weeks or so along with any time that he sees a provider/goes to the hospital to ensure they have all needed medical documentation to make a determination. Pt states he will do so today. I let him know our team remains available as needed moving forward for any additional questions/challenges with finances etc that may arise. Pt expressed gratitude for the quick return call. LCSW has also sent f/u about call to Aundra Millet, PharmD, and Annice Pih, LCSW for when she returns.   Octavio Graves, MSW, LCSW [facility] Heart/Vascular Care Navigation  [phone]

## 2021-06-23 NOTE — Progress Notes (Signed)
ADVANCED HEART FAILURE CLINIC NOTE  PCP: Steffanie Rainwater, MD HF Cardiologist: Dr. Gala Romney  HPI:  Paul [name] is a 60 y.o.male with obesity, HTN and ETOH use. Admitted in 11/21 with new onset HF.    Echo biventricular heart failure. LVEF 20-25% w/ global hypokinesis, mild LVH. RV systolic function moderately reduced. There is an LV apical thrombus measuring 2.4 cm x 1.3 cm.   - cMRI Severe LVE EF 22%. 1 cm x 1.4 cm mobile apical thrombus. Mild RVE with moderate reduction in RVEF 31%. Combination of mid/basal hypertrophy poor nulling of myocardium, elevated ECV and mildly elevated T1 suggests possibility of infiltrative cardiomyopathy.   - PYP 12/21 negative  - LHC 10/17/20  Prox LAD to Mid LAD lesion is 30% stenosed. Mid LAD lesion is 30% stenosed. Dist LAD lesion is 30% stenosed. Apical LAD with diffuse disease 1st Diag lesion is 70% stenosed  Developed NSVT and LifeVest placed.  Echo 2/22 EF 50% RV normal. Personally reviewed  Today he returns for HF follow up. Feels foggy during the day, some light headedness with standing fast. Limited mostly by hip issues (both have been replaced). Says he just does not feel as good as he did when he was younger. Denies SOB, CP, edema, or PND/Orthopnea. Working on portion control. No fever or chills. Weight at home 285 pounds. Taking all medications. He is unable to tolerate CPAP. He has been taking extra 2 lasix tablets 2x/week.   ROS: All systems negative except as listed in HPI, PMH and Problem List.  SH:  Social History   Socioeconomic History   Marital status: Divorced    Spouse name: Not on file   Number of children: Not on file   Years of education: Not on file   Highest education level: Not on file  Occupational History   Not on file  Tobacco Use   Smoking status: Never   Smokeless tobacco: Never  Vaping Use   Vaping Use: Never used  Substance and Sexual Activity   Alcohol use: Yes   Drug use: Never   Sexual  activity: Not on file  Other Topics Concern   Not on file  Social History Narrative   Not on file   Social Determinants of Health   Financial Resource Strain: Not on file  Food Insecurity: Not on file  Transportation Needs: Not on file  Physical Activity: Not on file  Stress: Not on file  Social Connections: Not on file  Intimate Partner Violence: Not on file   FH:  Family History  Problem Relation Age of Onset   Pulmonary Hypertension Mother    Hypertension Mother    CAD Father        s/p CABG x 4   Heart failure Father    Heart failure Sister    Hypertension Sister    Past Medical History:  Diagnosis Date   CHF (congestive heart failure) (HCC)    Dyspnea    History of hip replacement    Hypertension    Current Outpatient Medications  Medication Sig Dispense Refill   atorvastatin (LIPITOR) 40 MG tablet TAKE 1 TABLET BY MOUTH EVERY DAY 90 tablet 1   carvedilol (COREG) 12.5 MG tablet Take 1.5 tablets (18.75 mg total) by mouth 2 (two) times daily with a meal. 90 tablet 3   dapagliflozin propanediol (FARXIGA) 10 MG TABS tablet Take 1 tablet (10 mg total) by mouth daily before breakfast. 30 tablet 11   folic acid (FOLVITE) 1 MG  tablet Take 1 tablet (1 mg total) by mouth daily. 90 tablet 1   furosemide (LASIX) 40 MG tablet Take furosemide 40 mg daily. Can take extra 40 mg as needed for additional swelling or weight gain. 90 tablet 4   Multiple Vitamins-Minerals (CENTRUM SILVER 50+MEN PO) Take 1 tablet by mouth daily in the afternoon.     sacubitril-valsartan (ENTRESTO) 97-103 MG Take 1 tablet by mouth 2 (two) times daily. 60 tablet 11   spironolactone (ALDACTONE) 25 MG tablet TAKE 1 TABLET (25 MG TOTAL) BY MOUTH DAILY. 60 tablet 2   thiamine (CVS B-1) 100 MG tablet Take 1 tablet (100 mg total) by mouth daily. 90 tablet 1   No current facility-administered medications for this encounter.   BP 116/79   Pulse 86   Wt 129.1 kg (284 lb 9.6 oz)   SpO2 94%   BMI 35.57 kg/m    Wt Readings from Last 3 Encounters:  06/24/21 129.1 kg (284 lb 9.6 oz)  06/12/21 130 kg (286 lb 9.6 oz)  05/22/21 130.3 kg (287 lb 3.2 oz)   PHYSICAL EXAM: General:  NAD. No resp difficulty HEENT: Normal Neck: Supple. No JVD. Carotids 2+ bilat; no bruits. No lymphadenopathy or thryomegaly appreciated. Cor: PMI nondisplaced. Regular rate & rhythm. No rubs, gallops or murmurs. Lungs: Clear Abdomen: Obese, nontender, nondistended. No hepatosplenomegaly. No bruits or masses. Good bowel sounds. Extremities: No cyanosis, clubbing, rash, edema Neuro: Alert & oriented x 3, cranial nerves grossly intact. Moves all 4 extremities w/o difficulty. Affect pleasant.  ECG: SR 80 bpm (personally reviewed).   ASSESSMENT & PLAN:  1. Chronic Systolic Heart Failure - Admitted (11/21) with acute systolic HF. Markedly hypertensive in ED. HS trop 113>>93. EKG sinus tach w/ PACs - Echo (11/21): LVEF 20-25% w/ global HK, mild LVH and moderately reduced RV (no prior study for comparison)  - cMRI (12/21): Severe LVE EF 22%. 1 cm x 1.4 cm mobile apical thrombus. Mild RVE with moderate reduction in RVEF 31%. Combination of mid/basal hypertrophy poor nulling of myocardium, elevated ECV and mildly elevated T1 suggests possibility of infiltrative cardiomyopathy.  - PYP (12/21) negative. - Cath with mild CAD and elevated filling pressures. - Echo (2/22): EF 45-50% RV normal. - NYHA II, euvolemic on exam.  - Continue lasix 40 mg daily. OK to take an extra 40 mg prn edema. - Continue carvedilol 18.75 mg bid. - Continue Entresto 97-103 mg bid. - Continue Farxiga 10 mg daily. - Continue spiro 25 mg daily.   - Completed DAPA trial.  - BMET & BNP today.  2. CAD, mild non-obstructive - LHC 10/17/20  Prox LAD to Mid LAD lesion is 30% stenosed. Mid LAD lesion is 30% stenosed. Dist LAD lesion is 30% stenosed. Apical LAD with diffuse disease 1st Diag lesion is 70% stenosed.  -  No s/s angina. - Continue statin + ASA  81.   3. LV Thrombus  - Resolved. Off Eliquis.    4. Hypertension  - Well-controlled.   5. ETOH Abuse  - Still drinking 1-2 drinks every other day. - Encouraged to limit intake as much as possible.  6. Probable OSA - He says he has completed a sleep study, but I am unable to find records of him having completed one. - He says he cannot tolerate something blowing air in his nose.  Follow up in 2 months with Dr. Gala Romney + echo.  Anderson Malta [place], FNP  06/24/21

## 2021-06-24 ENCOUNTER — Other Ambulatory Visit: Payer: Self-pay

## 2021-06-24 ENCOUNTER — Ambulatory Visit (HOSPITAL_COMMUNITY)
Admission: RE | Admit: 2021-06-24 | Discharge: 2021-06-24 | Disposition: A | Discharge status: Home / Self Care | Payer: Medicaid Other | Source: Ambulatory Visit | Attending: Family Medicine | Admitting: Family Medicine

## 2021-06-24 ENCOUNTER — Encounter (HOSPITAL_COMMUNITY): Payer: Self-pay

## 2021-06-24 VITALS — BP 116/79 | HR 86 | Wt 284.6 lb

## 2021-06-24 DIAGNOSIS — F1010 Alcohol abuse, uncomplicated: Secondary | ICD-10-CM | POA: Diagnosis not present

## 2021-06-24 DIAGNOSIS — I5022 Chronic systolic (congestive) heart failure: Secondary | ICD-10-CM

## 2021-06-24 DIAGNOSIS — Z8249 Family history of ischemic heart disease and other diseases of the circulatory system: Secondary | ICD-10-CM | POA: Insufficient documentation

## 2021-06-24 DIAGNOSIS — E669 Obesity, unspecified: Secondary | ICD-10-CM | POA: Insufficient documentation

## 2021-06-24 DIAGNOSIS — Z79899 Other long term (current) drug therapy: Secondary | ICD-10-CM | POA: Insufficient documentation

## 2021-06-24 DIAGNOSIS — Z6835 Body mass index (BMI) 35.0-35.9, adult: Secondary | ICD-10-CM | POA: Diagnosis not present

## 2021-06-24 DIAGNOSIS — I110 Hypertensive heart disease with heart failure: Secondary | ICD-10-CM | POA: Diagnosis not present

## 2021-06-24 DIAGNOSIS — I5082 Biventricular heart failure: Secondary | ICD-10-CM | POA: Diagnosis not present

## 2021-06-24 DIAGNOSIS — I10 Essential (primary) hypertension: Secondary | ICD-10-CM

## 2021-06-24 DIAGNOSIS — I513 Intracardiac thrombosis, not elsewhere classified: Secondary | ICD-10-CM | POA: Insufficient documentation

## 2021-06-24 DIAGNOSIS — I2510 Atherosclerotic heart disease of native coronary artery without angina pectoris: Secondary | ICD-10-CM | POA: Diagnosis not present

## 2021-06-24 DIAGNOSIS — Z7289 Other problems related to lifestyle: Secondary | ICD-10-CM

## 2021-06-24 DIAGNOSIS — Z789 Other specified health status: Secondary | ICD-10-CM

## 2021-06-24 DIAGNOSIS — F1090 Alcohol use, unspecified, uncomplicated: Secondary | ICD-10-CM

## 2021-06-24 DIAGNOSIS — G4733 Obstructive sleep apnea (adult) (pediatric): Secondary | ICD-10-CM

## 2021-06-24 LAB — BASIC METABOLIC PANEL
Anion gap: 11 (ref 5–15)
BUN: 27 mg/dL — ABNORMAL HIGH (ref 8–23)
CO2: 28 mmol/L (ref 22–32)
Calcium: 9.6 mg/dL (ref 8.9–10.3)
Chloride: 96 mmol/L — ABNORMAL LOW (ref 98–111)
Creatinine, Ser: 1.55 mg/dL — ABNORMAL HIGH (ref 0.61–1.24)
GFR, Estimated: 51 mL/min — ABNORMAL LOW (ref >60)
Glucose, Bld: 100 mg/dL — ABNORMAL HIGH (ref 70–99)
Potassium: 4.2 mmol/L (ref 3.5–5.1)
Sodium: 135 mmol/L (ref 135–145)

## 2021-06-24 LAB — BRAIN NATRIURETIC PEPTIDE: B Natriuretic Peptide: 151.5 pg/mL — ABNORMAL HIGH (ref 0.0–100.0)

## 2021-06-24 NOTE — Patient Instructions (Addendum)
EKG done today.   Labs done today. We will contact you only if your labs are abnormal.  No medication changes were made. Please continue all current medications as prescribed.  Your physician recommends that you schedule a follow-up appointment in: 2 months with Dr. Suella Grove with an echo prior to your exam.    Your physician has requested that you have an echocardiogram. Echocardiography is a painless test that uses sound waves to create images of your heart. It provides your doctor with information about the size and shape of your heart and how well your heart's chambers and valves are working. This procedure takes approximately one hour. There are no restrictions for this procedure.  If you have any questions or concerns before your next appointment please send [place] a message through [place] or call our office at [phone].    TO LEAVE A MESSAGE FOR THE NURSE SELECT OPTION 2, PLEASE LEAVE A MESSAGE INCLUDING: YOUR NAME DATE OF BIRTH CALL BACK NUMBER REASON FOR CALL**this is important as we prioritize the call backs  YOU WILL RECEIVE A CALL BACK THE SAME DAY AS LONG AS YOU CALL BEFORE 4:00 PM   Do the following things EVERYDAY: Weigh yourself in the morning before breakfast. Write it down and keep it in a log. Take your medicines as prescribed Eat low salt foods--Limit salt (sodium) to 2000 mg per day.  Stay as active as you can everyday Limit all fluids for the day to less than 2 liters   At the [facility], you and your health needs are our priority. As part of our continuing mission to provide you with exceptional heart care, we have created designated Provider Care Teams. These Care Teams include your primary Cardiologist (physician) and Advanced Practice Providers (APPs- Physician Assistants and Nurse Practitioners) who all work together to provide you with the care you need, when you need it.   You may see any of the following providers on your designated  Care Team at your next follow up: Dr Arvilla Meres Dr Carron Curie, NP Robbie Lis, [place] Karle Plumber, PharmD   Please be sure to bring in all your medications bottles to every appointment.

## 2021-07-09 DIAGNOSIS — Z736 Limitation of activities due to disability: Secondary | ICD-10-CM

## 2021-07-16 ENCOUNTER — Other Ambulatory Visit: Payer: Self-pay | Admitting: Internal Medicine

## 2021-07-16 DIAGNOSIS — I5022 Chronic systolic (congestive) heart failure: Secondary | ICD-10-CM

## 2021-07-17 NOTE — Telephone Encounter (Signed)
TC to patient to assess s/s of swelling, weight gain and how much lasix he has been taking d/t early refill request from pharmacy.  Pt states he actually dropped his bottle and lost some of this pills. He denies any swelling or weight gain and states he has been feeling good.  He does report taking an extra 40mg  pill as needed.  He states he has enough pills to last until his insurance will pay for a refill. Pt was informed if a situation arises like this again, he can call his insurance company and explain the situation to them and ask them to approve an early refill.  He voices appreciation.  Will forward to red team, please refuse refill request. SChaplin, RN,BSN

## 2021-09-16 ENCOUNTER — Ambulatory Visit (HOSPITAL_BASED_OUTPATIENT_CLINIC_OR_DEPARTMENT_OTHER)
Admission: RE | Admit: 2021-09-16 | Discharge: 2021-09-16 | Disposition: A | Discharge status: Home / Self Care | Payer: Medicaid Other | Source: Ambulatory Visit | Attending: Internal Medicine | Admitting: Internal Medicine

## 2021-09-16 ENCOUNTER — Encounter (HOSPITAL_COMMUNITY): Payer: Self-pay | Admitting: Internal Medicine

## 2021-09-16 ENCOUNTER — Other Ambulatory Visit: Payer: Self-pay

## 2021-09-16 ENCOUNTER — Ambulatory Visit (HOSPITAL_COMMUNITY)
Admission: RE | Admit: 2021-09-16 | Discharge: 2021-09-16 | Disposition: A | Discharge status: Home / Self Care | Payer: Medicaid Other | Source: Ambulatory Visit | Attending: Internal Medicine | Admitting: Internal Medicine

## 2021-09-16 VITALS — BP 138/88 | HR 75 | Wt 282.2 lb

## 2021-09-16 DIAGNOSIS — I5022 Chronic systolic (congestive) heart failure: Secondary | ICD-10-CM | POA: Diagnosis present

## 2021-09-16 DIAGNOSIS — I110 Hypertensive heart disease with heart failure: Secondary | ICD-10-CM | POA: Insufficient documentation

## 2021-09-16 DIAGNOSIS — Z639 Problem related to primary support group, unspecified: Secondary | ICD-10-CM | POA: Insufficient documentation

## 2021-09-16 DIAGNOSIS — I10 Essential (primary) hypertension: Secondary | ICD-10-CM | POA: Diagnosis not present

## 2021-09-16 DIAGNOSIS — Z8249 Family history of ischemic heart disease and other diseases of the circulatory system: Secondary | ICD-10-CM | POA: Diagnosis not present

## 2021-09-16 DIAGNOSIS — E669 Obesity, unspecified: Secondary | ICD-10-CM | POA: Diagnosis not present

## 2021-09-16 DIAGNOSIS — Z789 Other specified health status: Secondary | ICD-10-CM | POA: Diagnosis not present

## 2021-09-16 DIAGNOSIS — I5082 Biventricular heart failure: Secondary | ICD-10-CM | POA: Insufficient documentation

## 2021-09-16 DIAGNOSIS — I2510 Atherosclerotic heart disease of native coronary artery without angina pectoris: Secondary | ICD-10-CM

## 2021-09-16 DIAGNOSIS — F1010 Alcohol abuse, uncomplicated: Secondary | ICD-10-CM | POA: Diagnosis not present

## 2021-09-16 DIAGNOSIS — Z79899 Other long term (current) drug therapy: Secondary | ICD-10-CM | POA: Insufficient documentation

## 2021-09-16 LAB — BASIC METABOLIC PANEL
Anion gap: 13 (ref 5–15)
BUN: 25 mg/dL — ABNORMAL HIGH (ref 8–23)
CO2: 27 mmol/L (ref 22–32)
Calcium: 9.8 mg/dL (ref 8.9–10.3)
Chloride: 97 mmol/L — ABNORMAL LOW (ref 98–111)
Creatinine, Ser: 1.65 mg/dL — ABNORMAL HIGH (ref 0.61–1.24)
GFR, Estimated: 47 mL/min — ABNORMAL LOW (ref >60)
Glucose, Bld: 92 mg/dL (ref 70–99)
Potassium: 4.1 mmol/L (ref 3.5–5.1)
Sodium: 137 mmol/L (ref 135–145)

## 2021-09-16 LAB — ECHOCARDIOGRAM COMPLETE
Area-P 1/2: 2.87 cm2
Calc EF: 60.7 %
S' Lateral: 3.2 cm
Single Plane A2C EF: 59.9 %
Single Plane A4C EF: 59.3 %

## 2021-09-16 LAB — BRAIN NATRIURETIC PEPTIDE: B Natriuretic Peptide: 168.2 pg/mL — ABNORMAL HIGH (ref 0.0–100.0)

## 2021-09-16 NOTE — Addendum Note (Signed)
Encounter addended by: Chinita Pester, CMA on: 09/16/2021 3:52 PM  Actions taken: Order list changed, Diagnosis association updated, Clinical Note Signed, Charge Capture section accepted

## 2021-09-16 NOTE — Progress Notes (Signed)
ADVANCED HEART FAILURE CLINIC NOTE  PCP: Steffanie Rainwater, MD HF Cardiologist: Dr. Gala Romney  HPI:  Paul [name] is a 60 y.o.male with obesity, HTN and ETOH use. Admitted in 11/21 with new onset HF.    Echo biventricular heart failure. LVEF 20-25% w/ global hypokinesis, mild LVH. RV systolic function moderately reduced. There is an LV apical thrombus measuring 2.4 cm x 1.3 cm.   - cMRI Severe LVE EF 22%. 1 cm x 1.4 cm mobile apical thrombus. Mild RVE with moderate reduction in RVEF 31%. Combination of mid/basal hypertrophy poor nulling of myocardium, elevated ECV and mildly elevated T1 suggests possibility of infiltrative cardiomyopathy.   - PYP 12/21 negative  - LHC 10/17/20  Prox LAD to Mid LAD lesion is 30% stenosed. Mid LAD lesion is 30% stenosed. Dist LAD lesion is 30% stenosed. Apical LAD with diffuse disease 1st Diag lesion is 70% stenosed  Developed NSVT and LifeVest placed.  Echo 2/22 EF 50% RV normal.   Today he returns for HF follow up. Says he feels fatigued and stressed out with family issues. No SOB, orthopnea or edema. Does a "little walking" as his hips will allow. SBP at home remains 110-120/60s. Mildly dizzy when standing. Taking lasix 40 bid.    Echo today 09/16/21 EF 55-60% mod LVH G1 DD Personally reviewed  ROS: All systems negative except as listed in HPI, PMH and Problem List.  SH:  Social History   Socioeconomic History   Marital status: Divorced    Spouse name: Not on file   Number of children: Not on file   Years of education: Not on file   Highest education level: Not on file  Occupational History   Not on file  Tobacco Use   Smoking status: Never   Smokeless tobacco: Never  Vaping Use   Vaping Use: Never used  Substance and Sexual Activity   Alcohol use: Yes   Drug use: Never   Sexual activity: Not on file  Other Topics Concern   Not on file  Social History Narrative   Not on file   Social Determinants of Health   Financial  Resource Strain: Not on file  Food Insecurity: Not on file  Transportation Needs: Not on file  Physical Activity: Not on file  Stress: Not on file  Social Connections: Not on file  Intimate Partner Violence: Not on file   FH:  Family History  Problem Relation Age of Onset   Pulmonary Hypertension Mother    Hypertension Mother    CAD Father        s/p CABG x 4   Heart failure Father    Heart failure Sister    Hypertension Sister    Past Medical History:  Diagnosis Date   CHF (congestive heart failure) (HCC)    Dyspnea    History of hip replacement    Hypertension    Current Outpatient Medications  Medication Sig Dispense Refill   Ascorbic Acid (VITAMIN C) 1000 MG tablet Take 1,000 mg by mouth daily.     atorvastatin (LIPITOR) 40 MG tablet TAKE 1 TABLET BY MOUTH EVERY DAY 90 tablet 1   carvedilol (COREG) 12.5 MG tablet Take 1.5 tablets (18.75 mg total) by mouth 2 (two) times daily with a meal. 90 tablet 3   dapagliflozin propanediol (FARXIGA) 10 MG TABS tablet Take 1 tablet (10 mg total) by mouth daily before breakfast. 30 tablet 11   folic acid (FOLVITE) 1 MG tablet Take 1 tablet (1 mg  total) by mouth daily. 90 tablet 1   furosemide (LASIX) 40 MG tablet Take furosemide 40 mg daily. Can take extra 40 mg as needed for additional swelling or weight gain. 90 tablet 4   sacubitril-valsartan (ENTRESTO) 97-103 MG Take 1 tablet by mouth 2 (two) times daily. 60 tablet 11   spironolactone (ALDACTONE) 25 MG tablet TAKE 1 TABLET (25 MG TOTAL) BY MOUTH DAILY. 60 tablet 2   thiamine (CVS B-1) 100 MG tablet Take 1 tablet (100 mg total) by mouth daily. 90 tablet 1   No current facility-administered medications for this encounter.   BP 138/88   Pulse 75   Wt 128 kg (282 lb 3.2 oz)   SpO2 97%   BMI 35.27 kg/m   Wt Readings from Last 3 Encounters:  09/16/21 128 kg (282 lb 3.2 oz)  06/24/21 129.1 kg (284 lb 9.6 oz)  06/12/21 130 kg (286 lb 9.6 oz)   PHYSICAL EXAM: General:  Well  appearing. No resp difficulty HEENT: normal Neck: supple. no JVD. Carotids 2+ bilat; no bruits. No lymphadenopathy or thryomegaly appreciated. Cor: PMI nondisplaced. Regular rate & rhythm. No rubs, gallops or murmurs. Lungs: clear Abdomen: obese  soft, nontender, nondistended. No hepatosplenomegaly. No bruits or masses. Good bowel sounds. Extremities: no cyanosis, clubbing, rash, edema Neuro: alert & orientedx3, cranial nerves grossly intact. moves all 4 extremities w/o difficulty. Affect pleasant   ASSESSMENT & PLAN:  1. Chronic Systolic Heart Failure - Admitted (11/21) with acute systolic HF. Markedly hypertensive in ED. HS trop 113>>93. EKG sinus tach w/ PACs - Echo (11/21): LVEF 20-25% w/ global HK, mild LVH and moderately reduced RV (no prior study for comparison)  - cMRI (12/21): Severe LVE EF 22%. 1 cm x 1.4 cm mobile apical thrombus. Mild RVE with moderate reduction in RVEF 31%. Combination of mid/basal hypertrophy poor nulling of myocardium, elevated ECV and mildly elevated T1 suggests possibility of infiltrative cardiomyopathy.  - PYP (12/21) negative. - Cath with mild CAD and elevated filling pressures. - Echo (2/22): EF 45-50% RV normal. - Echo today 09/16/21 EF 55-60% mod LVH G1 DD Personally reviewed - NYHA I-II - Taking lasix 40 bid. Has orthostatic symptoms. Told him to cut lasix back a bit - Continue carvedilol 18.75 mg bid. - Continue Entresto 97-103 mg bid. - Continue Farxiga 10 mg daily. - Continue spiro 25 mg daily.   - Labs today  2. CAD, mild non-obstructive - LHC 10/17/20  Prox LAD to Mid LAD lesion is 30% stenosed. Mid LAD lesion is 30% stenosed. Dist LAD lesion is 30% stenosed. Apical LAD with diffuse disease 1st Diag lesion is 70% stenosed.  - No s/s angina - Continue statin + ASA 81.   3. LV Thrombus  - Resolved. Off Eliquis.    4. Hypertension  - Well-controlled. - Has some orthostatic symptoms. Cut back lasix to 40 daily. Take extra as needed    5. ETOH Abuse  - Still drinking occasionally  6. Probable OSA - Says he is snoring less. Not interested in sleep study    Arvilla Meres, MD  09/16/21

## 2021-09-16 NOTE — Progress Notes (Signed)
  Echocardiogram 2D Echocardiogram has been performed.  Janalyn Harder 09/16/2021, 2:52 PM

## 2021-09-16 NOTE — Patient Instructions (Addendum)
Labs done today. We will contact you only if your labs are abnormal.  No medication changes were made. Please continue all current medications as prescribed.  Your physician recommends that you schedule a follow-up appointment in: 9 months. Please contact our office in July 2023 to schedule a August 2023 appointment.   If you have any questions or concerns before your next appointment please send [place] a message through [place] or call our office at [phone].    TO LEAVE A MESSAGE FOR THE NURSE SELECT OPTION 2, PLEASE LEAVE A MESSAGE INCLUDING: YOUR NAME DATE OF BIRTH CALL BACK NUMBER REASON FOR CALL**this is important as we prioritize the call backs  YOU WILL RECEIVE A CALL BACK THE SAME DAY AS LONG AS YOU CALL BEFORE 4:00 PM   Do the following things EVERYDAY: Weigh yourself in the morning before breakfast. Write it down and keep it in a log. Take your medicines as prescribed Eat low salt foods--Limit salt (sodium) to 2000 mg per day.  Stay as active as you can everyday Limit all fluids for the day to less than 2 liters   At the [facility], you and your health needs are our priority. As part of our continuing mission to provide you with exceptional heart care, we have created designated Provider Care Teams. These Care Teams include your primary Cardiologist (physician) and Advanced Practice Providers (APPs- Physician Assistants and Nurse Practitioners) who all work together to provide you with the care you need, when you need it.   You may see any of the following providers on your designated Care Team at your next follow up: Dr Arvilla Meres Dr Carron Curie, NP Robbie Lis, [place] Karle Plumber, PharmD   Please be sure to bring in all your medications bottles to every appointment.

## 2021-09-30 ENCOUNTER — Other Ambulatory Visit (HOSPITAL_COMMUNITY): Payer: Self-pay | Admitting: Cardiology

## 2021-11-03 ENCOUNTER — Other Ambulatory Visit: Payer: Self-pay | Admitting: Internal Medicine

## 2021-11-03 ENCOUNTER — Other Ambulatory Visit (HOSPITAL_COMMUNITY): Payer: Self-pay | Admitting: Cardiology

## 2021-11-04 NOTE — Telephone Encounter (Signed)
Next appt scheduled 12/18/21 with PCP. °

## 2021-12-08 ENCOUNTER — Other Ambulatory Visit (HOSPITAL_COMMUNITY): Payer: Self-pay | Admitting: *Deleted

## 2021-12-08 DIAGNOSIS — I5022 Chronic systolic (congestive) heart failure: Secondary | ICD-10-CM

## 2021-12-08 MED ORDER — FUROSEMIDE 40 MG PO TABS: ORAL_TABLET | ORAL | 4 refills | Status: DC

## 2021-12-11 NOTE — Research (Signed)
Consent process sent for Dr. Aundra Dubin to [facility].

## 2021-12-12 ENCOUNTER — Encounter: Payer: Self-pay | Admitting: *Deleted

## 2021-12-12 DIAGNOSIS — Z006 Encounter for examination for normal comparison and control in clinical research program: Secondary | ICD-10-CM

## 2021-12-12 NOTE — Research (Signed)
Error

## 2021-12-14 NOTE — Research (Signed)
DAPA Subject:[id number]       Visit:  1    Date of service: 12-02-201  Please review this Research note and Cosign; Make any comments as needed.

## 2021-12-14 NOTE — Research (Signed)
DAPA Subject: [id number]      Visit:  2   Date of service: 10-24-2020  Please review this Research note and Cosign; Make any comments as needed.

## 2021-12-14 NOTE — Research (Signed)
DAPA Subject: [id number]      Visit:  3   Date of service: 11-18-2020  Please review this Research note and Cosign; Make any comments as needed.

## 2021-12-14 NOTE — Research (Signed)
DAPA Subject:  [id number]     Visit: 4    Date of service: 12-20-2020  Please review this Research note and Cosign; Make any comments as needed.

## 2021-12-18 ENCOUNTER — Encounter: Payer: Self-pay | Admitting: Student

## 2021-12-18 ENCOUNTER — Ambulatory Visit (INDEPENDENT_AMBULATORY_CARE_PROVIDER_SITE_OTHER): Payer: Medicaid Other | Admitting: Student

## 2021-12-18 ENCOUNTER — Ambulatory Visit (HOSPITAL_COMMUNITY)
Admission: RE | Admit: 2021-12-18 | Discharge: 2021-12-18 | Disposition: A | Discharge status: Home / Self Care | Payer: Medicaid Other | Source: Ambulatory Visit | Attending: Internal Medicine | Admitting: Internal Medicine

## 2021-12-18 ENCOUNTER — Other Ambulatory Visit: Payer: Self-pay

## 2021-12-18 VITALS — BP 120/73 | HR 131 | Temp 98.2°F | Ht 75.0 in | Wt 288.2 lb

## 2021-12-18 DIAGNOSIS — I10 Essential (primary) hypertension: Secondary | ICD-10-CM

## 2021-12-18 DIAGNOSIS — I110 Hypertensive heart disease with heart failure: Secondary | ICD-10-CM | POA: Diagnosis not present

## 2021-12-18 DIAGNOSIS — R000 Tachycardia, unspecified: Secondary | ICD-10-CM

## 2021-12-18 DIAGNOSIS — I4892 Unspecified atrial flutter: Secondary | ICD-10-CM | POA: Diagnosis not present

## 2021-12-18 DIAGNOSIS — R42 Dizziness and giddiness: Secondary | ICD-10-CM | POA: Diagnosis present

## 2021-12-18 DIAGNOSIS — R002 Palpitations: Secondary | ICD-10-CM | POA: Diagnosis present

## 2021-12-18 DIAGNOSIS — I5082 Biventricular heart failure: Secondary | ICD-10-CM

## 2021-12-18 MED ORDER — CARVEDILOL 25 MG PO TABS: 25.0000 mg | ORAL_TABLET | Freq: Two times a day (BID) | ORAL | 11 refills | Status: DC

## 2021-12-18 MED ORDER — CARVEDILOL 3.125 MG PO TABS
25.0000 mg | ORAL_TABLET | Freq: Once | ORAL | Status: AC
  Administered 2021-12-18: 25 mg via ORAL

## 2021-12-18 MED ORDER — APIXABAN 5 MG PO TABS: 5.0000 mg | ORAL_TABLET | Freq: Two times a day (BID) | ORAL | 1 refills | Status: DC

## 2021-12-18 NOTE — Patient Instructions (Addendum)
Thank you, [name] for allowing [place] to provide your care today. Today we discussed palpitations, lightheadedness and shortness of breath. An EKG showed that you have something called aflutter which is likely causing you to have the symptoms.  We will give you a dose of your Coreg here in clinic and increase your current dose to 25 mg twice daily.  We are starting you on a blood thinner to help decrease your risk of blood clots.  We also recommend you see Dr. Gala Romney next week.  Go straight to the ER if your symptoms worsen.  I have place a referral back to cardiology for you to see Dr. Gala Romney.  I have ordered the following medication/changed the following medications:  Increase Coreg to 25 mg twice daily Start Eliquis 5 mg twice daily  My Chart Access: [email or web address]  Please follow-up in 1 week.  Please make sure to arrive 15 minutes prior to your next appointment. If you arrive late, you may be asked to reschedule.    We look forward to seeing you next time. Please call our clinic at [phone] if you have any questions or concerns. The best time to call is Monday-Friday from 9am-4pm, but there is someone available 24/7. If after hours or the weekend, call the main hospital number and ask for the Internal Medicine Resident On-Call. If you need medication refills, please notify your pharmacy one week in advance and they will send [place] a request.   Thank you for letting us take part in your care. Wishing you the best!  Steffanie Rainwater, MD 12/18/2021, 4:53 PM IM Resident, PGY-2 [place]wayne Heck 41:10

## 2021-12-18 NOTE — Progress Notes (Signed)
° °  CC: Palpitations/lightheadedness  HPI:  [name] is a 61 y.o. male with PMH as below who presents to clinic for evaluation of palpitations and dizziness. Please see problem based charting for evaluation, assessment and plan.  Past Medical History:  Diagnosis Date   CHF (congestive heart failure) (HCC)    Dyspnea    History of hip replacement    Hypertension     Review of Systems:  Constitutional: Positive for fatigue, lightheadedness and weight changes.  Negative for fever Eyes: Negative for visual changes Respiratory: Positive for dyspnea on exertion Cardiac: Positive for palpitations.  Negative for chest pain MSK: Negative for back pain. Positive for hip pain Neuro: Negative for headache, numbness or tingling. Positive for pre-syncope  Physical Exam: General: Pleasant, well-appearing elderly man. No acute distress. Neck: Supple. No JVD Cardiac: Tachycardic. Regular rhythm. No murmurs, rubs or gallops.  Trace BLE edema Respiratory: Lungs CTAB. No wheezing or crackles. Abdominal: Obese. Soft. Mild distention.  Normal BS. Skin: Warm, dry and intact without rashes or lesions Extremities: Atraumatic. Full ROM. Palpable radial and DP pulses. Neuro: A&O x 3. Moves all extremities Psych: Appropriate mood and affect.  Vitals:   12/18/21 1531  BP: 120/73  Pulse: (!) 131  Temp: 98.2 F (36.8 C)  TempSrc: Oral  SpO2: 100%  Weight: 288 lb 3.2 oz (130.7 kg)  Height: 6\' 3"  (1.905 m)     Assessment & Plan:   See Encounters Tab for problem based charting.  Patient discussed with Dr. , MD, MPH

## 2021-12-19 ENCOUNTER — Encounter: Payer: Self-pay | Admitting: Student

## 2021-12-19 DIAGNOSIS — I4892 Unspecified atrial flutter: Secondary | ICD-10-CM | POA: Insufficient documentation

## 2021-12-19 NOTE — Assessment & Plan Note (Signed)
Patient endorse ran out of his Lasix prescription early last month due to requiring more as needed Lasix to keep his weight down. He went 10 days without taking his Lasix. For the past 2 to 3 weeks he has had palpitations, worsening dyspnea on exertion, increased weight gain of about 10-12 pounds as well as leg swelling. Around the same time, he endorsed having multiple stressors due to his daughter's legal trouble as her financial responsibilities. Back to taking Lasix 40 mg twice daily his weight is down to 288 lb, which is still 6 pounds above his last weight at his last cardiology visit. On exam, there is some trace BLE edema but no wheezing or significant rales. Last echocardiogram 11/22 showed recovered EF to 60 to 65%, moderate LVH, G1DD and severely dilated left atrium.  Plan: -- Continue Lasix 40 mg twice daily until follow-up with cardiology -- Continue Lisabeth Register and spironolactone -- Increase Coreg to 25 mg twice daily -- Follow-up in 1 week

## 2021-12-19 NOTE — Assessment & Plan Note (Addendum)
Patient here for evaluation of palpitations and worsening lightheadedness. States his lightheadedness is worsening the last 2 to 3 weeks and had an episode of presyncope while working in his yard last week. He endorses progressive dyspnea on exertion and increased weight gain that has now improved after taking his Lasix 40 mg twice daily. He reports a 10-day period in which she did not have any Lasix leading to significant weight gain and worsening heart failure symptoms. He also reports some recent life events with his daughter that has put a lot of stress on him but he continues to pray and hope everything will work out. He has felt more foggy and fatigued the last 2 weeks and not feeling like his usual self. In clinic, patient was found to have heart rate of 131 but normal BP of 120/73 an EKG was done that showed a flutter.  Heart rate likely mitigated by patient being on Coreg.  A/P Mr. Lata is a 61 year old with a history of nonsustained V. Tach, occasional lightheadedness, biventricular heart failure with now recovered EF to 60-65% presents to clinic for evaluation of palpitations and worsening lightheadedness and found to have a flutter with 2:1 AV conduction. Patient hemodynamically stable.  BP currently stable with SBP in the 120s. O2 sat of 100% on room air. Exam significant for tachycardia but regular rhythm. Trace bilateral BLE edema but no rales or JVD. Patient's a flutter likely secondary to worsening heart failure symptoms and fluid overloaded state in the setting of patient ran out of his Lasix few weeks ago. Contributing factors may include patient's recent stressors and OSA. Due to patient's significant cardiac history and history of nonsustained V. tach, we discussed going straight to the hospital for further evaluation.  However, patient states he feels okay will prefer to avoid hospitalization.  We will rate control his a flutter with increased dose of his beta-blocker to decrease his symptom  burden with close monitoring until he is able to follow-up with cardiology or return to clinic next week for repeat EKG. While HF with aflutter has a lower embolic risk than HF with A. Fib, risk of embolism is still higher than HF alone especially with someone with severely dilated LA and hx of LV thrombus like our patient. CHA2DS2-VASc of 3 so we will start patient on anticoagulation for now. Referring patient back to cardiology for possible rhythm control if he does not spontaneously convert back to normal sinus.  -- Coreg 25 mg x1 dose in clinic -- Start Eliquis 5 mg twice daily -- Increase Coreg to 25 mg twice daily -- Referral back to cardiology -- 1 week clinic follow-up for repeat EKG and further work-up if still in a-flutter (check electrolytes, CMP, CBC, TSH) -- Advised to go to the ED if symptoms worsen.

## 2021-12-19 NOTE — Assessment & Plan Note (Signed)
Patient's BP has been well controlled.  He endorsed palpitations, lightheadedness and dyspnea on exertion.   Plan: -- Increase Coreg to 25 mg twice daily -- Continue Lasix 40 mg daily with 40 mg as needed for weight gain -- Continue Entresto and spironolactone -- Follow-up in 1 week

## 2021-12-22 ENCOUNTER — Telehealth (HOSPITAL_COMMUNITY): Payer: Self-pay | Admitting: *Deleted

## 2021-12-22 DIAGNOSIS — I4892 Unspecified atrial flutter: Secondary | ICD-10-CM

## 2021-12-22 NOTE — Telephone Encounter (Signed)
Dr.Amponsah with Internal Medicine called to report pt was seen in office on 2/2 c/o palpitations and dizziness.  Ekg was performed and pt was in a flutter with a rate in the 130s.  Pt was started on eliquis and carvedilol increased to 25mg  bid.  Internal medicine wanted pt seen by cardiology this week. Per Adline Potter refer to the afib clinic. Discussed with Vela Prose and referral placed.

## 2021-12-22 NOTE — Progress Notes (Signed)
Internal Medicine Clinic Attending  I saw and evaluated the patient.  I personally confirmed the key portions of the history and exam documented by Dr. Coy Saunas and I reviewed pertinent patient test results.  The assessment, diagnosis, and plan were formulated together and I agree with the documentation in the residents note.   Patient here with new onset atrial flutter with 2:1 block. He is minimally symptomatic but did have an episode of near syncope the other day while doing yard work. He has a history of non ischemic cardiomyopathy and follows with the advanced heart failure clinic. He reports a recent decrease in his diuretics per cards in December, and he subsequently gain ~ 10 lbs of fluid weight. He has since gone back up on his lasix dosing and weight is improving, but he is still about 4lbs over his dry weight. On exam he appears euvolemic. We have increased his beta blocker and restarted his Eliquis. Dr. Coy Saunas has followed up with patient over the phone since the visit and he reports HR has improved but he is still intermittently tachycardic. I have asked Dr. Coy Saunas to help facilitate cardiology follow up for this week.

## 2021-12-23 ENCOUNTER — Encounter (HOSPITAL_COMMUNITY): Payer: Self-pay | Admitting: Physician Assistant

## 2021-12-23 ENCOUNTER — Other Ambulatory Visit: Payer: Self-pay

## 2021-12-23 ENCOUNTER — Ambulatory Visit (HOSPITAL_COMMUNITY)
Admission: RE | Admit: 2021-12-23 | Discharge: 2021-12-23 | Disposition: A | Discharge status: Home / Self Care | Payer: Medicaid Other | Source: Ambulatory Visit | Attending: Physician Assistant | Admitting: Physician Assistant

## 2021-12-23 VITALS — BP 90/78 | HR 127 | Ht 75.0 in | Wt 291.0 lb

## 2021-12-23 DIAGNOSIS — D6869 Other thrombophilia: Secondary | ICD-10-CM | POA: Diagnosis not present

## 2021-12-23 DIAGNOSIS — Z79899 Other long term (current) drug therapy: Secondary | ICD-10-CM | POA: Diagnosis not present

## 2021-12-23 DIAGNOSIS — I484 Atypical atrial flutter: Secondary | ICD-10-CM

## 2021-12-23 DIAGNOSIS — I5022 Chronic systolic (congestive) heart failure: Secondary | ICD-10-CM | POA: Insufficient documentation

## 2021-12-23 DIAGNOSIS — Z7182 Exercise counseling: Secondary | ICD-10-CM | POA: Diagnosis not present

## 2021-12-23 DIAGNOSIS — I2510 Atherosclerotic heart disease of native coronary artery without angina pectoris: Secondary | ICD-10-CM | POA: Insufficient documentation

## 2021-12-23 DIAGNOSIS — Z91199 Patient's noncompliance with other medical treatment and regimen due to unspecified reason: Secondary | ICD-10-CM | POA: Insufficient documentation

## 2021-12-23 DIAGNOSIS — I110 Hypertensive heart disease with heart failure: Secondary | ICD-10-CM | POA: Diagnosis not present

## 2021-12-23 DIAGNOSIS — F1090 Alcohol use, unspecified, uncomplicated: Secondary | ICD-10-CM | POA: Insufficient documentation

## 2021-12-23 DIAGNOSIS — I459 Conduction disorder, unspecified: Secondary | ICD-10-CM | POA: Insufficient documentation

## 2021-12-23 DIAGNOSIS — E669 Obesity, unspecified: Secondary | ICD-10-CM | POA: Diagnosis not present

## 2021-12-23 DIAGNOSIS — G4733 Obstructive sleep apnea (adult) (pediatric): Secondary | ICD-10-CM | POA: Diagnosis not present

## 2021-12-23 DIAGNOSIS — I4892 Unspecified atrial flutter: Secondary | ICD-10-CM | POA: Diagnosis present

## 2021-12-23 DIAGNOSIS — Z8249 Family history of ischemic heart disease and other diseases of the circulatory system: Secondary | ICD-10-CM | POA: Insufficient documentation

## 2021-12-23 DIAGNOSIS — Z6836 Body mass index (BMI) 36.0-36.9, adult: Secondary | ICD-10-CM | POA: Diagnosis not present

## 2021-12-23 DIAGNOSIS — Z7901 Long term (current) use of anticoagulants: Secondary | ICD-10-CM | POA: Insufficient documentation

## 2021-12-23 NOTE — H&P (View-Only) (Signed)
° ° °Primary Care Physician: Amponsah, Prosper M, MD °Primary Cardiologist: Dr Bensimhon °Primary Electrophysiologist: none °Referring Physician: Dr Bensimhon  ° ° °Paul [name] is a 61 y.o. male with a history of chronic systolic CHF, CAD, HTN, OSA, atrial flutter who presents for consultation in the [facility]. The patient was initially diagnosed with atrial flutter after presenting to his PCP with symptoms of lightheadedness and palpitations over the previous 2-3 weeks. ECG showed atrial flutter with 2:1 block. His carvedilol was increased and he was started on Eliquis for a CHADS2VASC score of 3. He reports that overall he feels reasonably well today despite his fast heart rate. He reports he is about 3-4 lbs up from his dry weight and does feel some "heaviness" in his legs. No SOB, chest pain, or orthopnea. No lower extremity edema.  ° °Today, he denies symptoms of chest pain, shortness of breath, orthopnea, PND, lower extremity edema, dizziness, presyncope, syncope, bleeding, or neurologic sequela. The patient is tolerating medications without difficulties and is otherwise without complaint today.  ° ° °Atrial Fibrillation Risk Factors: ° °he does have symptoms or diagnosis of sleep apnea. °he is not compliant with CPAP therapy. °he does not have a history of rheumatic fever. °he does have a history of alcohol use. ° ° °he has a BMI of Body mass index is 36.37 kg/m².. °Filed Weights  ° 12/23/21 1313  °Weight: 132 kg  ° ° °Family History  °Problem Relation Age of Onset  ° Pulmonary Hypertension Mother   ° Hypertension Mother   ° CAD Father   °     s/p CABG x 4  ° Heart failure Father   ° Heart failure Sister   ° Hypertension Sister   ° ° ° °Atrial Fibrillation Management history: ° °Previous antiarrhythmic drugs: none °Previous cardioversions: none °Previous ablations: none °CHADS2VASC score: 3 °Anticoagulation history: Eliquis ° ° °Past Medical History:  °Diagnosis Date  ° CHF  (congestive heart failure) (HCC)   ° Dyspnea   ° History of hip replacement   ° Hypertension   ° °Past Surgical History:  °Procedure Laterality Date  ° RIGHT/LEFT HEART CATH AND CORONARY ANGIOGRAPHY N/A 10/17/2020  ° Procedure: RIGHT/LEFT HEART CATH AND CORONARY ANGIOGRAPHY;  Surgeon: Bensimhon, Daniel R, MD;  [facility]: [facility];  Service: Cardiovascular;  Laterality: N/A;  ° TOTAL HIP ARTHROPLASTY    ° ° °Current Outpatient Medications  °Medication Sig Dispense Refill  ° apixaban (ELIQUIS) 5 MG TABS tablet Take 1 tablet (5 mg total) by mouth 2 (two) times daily. 60 tablet 1  ° Ascorbic Acid (VITAMIN C) 1000 MG tablet Take 1,000 mg by mouth daily.    ° atorvastatin (LIPITOR) 40 MG tablet Take 1 tablet (40 mg total) by mouth daily. 90 tablet 3  ° carvedilol (COREG) 25 MG tablet Take 1 tablet (25 mg total) by mouth 2 (two) times daily. 60 tablet 11  ° dapagliflozin propanediol (FARXIGA) 10 MG TABS tablet Take 1 tablet (10 mg total) by mouth daily before breakfast. 30 tablet 11  ° folic acid (FOLVITE) 1 MG tablet TAKE 1 TABLET BY MOUTH EVERY DAY 90 tablet 1  ° furosemide (LASIX) 40 MG tablet Take furosemide 40 mg daily. Can take extra 40 mg as needed for additional swelling or weight gain. 90 tablet 4  ° sacubitril-valsartan (ENTRESTO) 97-103 MG Take 1 tablet by mouth 2 (two) times daily. 60 tablet 11  ° spironolactone (ALDACTONE) 25 MG tablet TAKE 1 TABLET (25 MG   TOTAL) BY MOUTH DAILY. 90 tablet 3   thiamine 100 MG tablet TAKE 1 TABLET BY MOUTH EVERY DAY 90 tablet 3   No current facility-administered medications for this encounter.    Allergies  Allergen Reactions   Shellfish Allergy Shortness Of Breath and Swelling    Throat swelling    Social History   Socioeconomic History   Marital status: Divorced    Spouse name: Not on file   Number of children: Not on file   Years of education: Not on file   Highest education level: Not on file  Occupational History   Not on file  Tobacco Use    Smoking status: Never   Smokeless tobacco: Former    Types: Chew   Tobacco comments:    Former chew/dip 12/23/21  Vaping Use   Vaping Use: Never used  Substance and Sexual Activity   Alcohol use: Yes    Alcohol/week: 4.0 - 8.0 standard drinks    Types: 4 - 8 Cans of beer per week    Comment: 1-2 beers every other day 12/23/21   Drug use: Not Currently   Sexual activity: Not on file  Other Topics Concern   Not on file  Social History Narrative   Not on file   Social Determinants of Health   Financial Resource Strain: Not on file  Food Insecurity: Not on file  Transportation Needs: Not on file  Physical Activity: Not on file  Stress: Not on file  Social Connections: Not on file  Intimate Partner Violence: Not on file     ROS- All systems are reviewed and negative except as per the HPI above.  Physical Exam: Vitals:   12/23/21 1313  BP: 90/78  Pulse: (!) 127  Weight: 132 kg  Height: 6\' 3"  (1.905 m)    GEN- The patient is a well appearing obese male, alert and oriented x 3 today.   Head- normocephalic, atraumatic Eyes-  Sclera clear, conjunctiva pink Ears- hearing intact Oropharynx- clear Neck- supple  Lungs- Clear to ausculation bilaterally, normal work of breathing Heart- Regular rate and rhythm, tachycardia, no murmurs, rubs or gallops  GI- soft, NT, ND, + BS Extremities- no clubbing, cyanosis, or edema MS- no significant deformity or atrophy Skin- no rash or lesion Psych- euthymic mood, full affect Neuro- strength and sensation are intact  Wt Readings from Last 3 Encounters:  12/23/21 132 kg  12/18/21 130.7 kg  09/16/21 128 kg    EKG today demonstrates  Atrial flutter with 2:1 block Vent. rate 127 BPM PR interval * ms QRS duration 100 ms QT/QTcB 336/488 ms  Echo 09/16/21 demonstrated  1. Left ventricular ejection fraction, by estimation, is 60 to 65%. The  left ventricle has normal function. The left ventricle has no regional  wall motion  abnormalities. There is moderate left ventricular hypertrophy. Left ventricular diastolic parameters are consistent with Grade I diastolic dysfunction (impaired relaxation).   2. Right ventricular systolic function is normal. The right ventricular  size is normal. There is normal pulmonary artery systolic pressure.   3. Left atrial size was severely dilated.   4. The mitral valve is normal in structure. Trivial mitral valve  regurgitation. No evidence of mitral stenosis.   5. The aortic valve is tricuspid. There is mild calcification of the  aortic valve. Aortic valve regurgitation is not visualized. No aortic  stenosis is present.   6. The inferior vena cava is normal in size with greater than 50%  respiratory variability, suggesting  right atrial pressure of 3 mmHg.   Epic records are reviewed at length today  CHA2DS2-VASc Score = 3  The patient's score is based upon: CHF History: 1 HTN History: 1 Diabetes History: 0 Stroke History: 0 Vascular Disease History: 1 Age Score: 0 Gender Score: 0       ASSESSMENT AND PLAN: 1. Atrial flutter The patient's CHA2DS2-VASc score is 3, indicating a 3.2% annual risk of stroke.   Patient remains in rapid atrial flutter today.  Will arrange for DCCV (Eliquis started 2/2) No room in BP to add additional rate control.  Continue carvedilol 25 mg BID Continue Eliquis 5 mg BID ED precautions given if he has clinical deterioration prior to scheduled DCCV.   2. Secondary Hypercoagulable State (ICD10:  D68.69) The patient is at significant risk for stroke/thromboembolism based upon his CHA2DS2-VASc Score of 3.  Continue Apixaban (Eliquis).   3. Obesity Body mass index is 36.37 kg/m. Lifestyle modification was discussed at length including regular exercise and weight reduction.  4. Obstructive sleep apnea Patient intolerant of CPAP.  5. Chronic systolic CHF EF recovered to 55-60% Continue Lasix 40 mg daily with extra dose PRN for swelling.    6. CAD No anginal symptoms.  7. HTN Stable, no changes today.   Follow up in the AF clinic post DCCV.    [facility] [address] [place], [place] [address] [phone] 12/23/2021 2:06 PM

## 2021-12-23 NOTE — Progress Notes (Signed)
Primary Care Physician: Steffanie Rainwater, MD Primary Cardiologist: Dr Gala Romney Primary Electrophysiologist: none Referring Physician: Dr Debbrah Alar Klett is a 61 y.o. male with a history of chronic systolic CHF, CAD, HTN, OSA, atrial flutter who presents for consultation in the [facility]. The patient was initially diagnosed with atrial flutter after presenting to his PCP with symptoms of lightheadedness and palpitations over the previous 2-3 weeks. ECG showed atrial flutter with 2:1 block. His carvedilol was increased and he was started on Eliquis for a CHADS2VASC score of 3. He reports that overall he feels reasonably well today despite his fast heart rate. He reports he is about 3-4 lbs up from his dry weight and does feel some "heaviness" in his legs. No SOB, chest pain, or orthopnea. No lower extremity edema.   Today, he denies symptoms of chest pain, shortness of breath, orthopnea, PND, lower extremity edema, dizziness, presyncope, syncope, bleeding, or neurologic sequela. The patient is tolerating medications without difficulties and is otherwise without complaint today.    Atrial Fibrillation Risk Factors:  he does have symptoms or diagnosis of sleep apnea. he is not compliant with CPAP therapy. he does not have a history of rheumatic fever. he does have a history of alcohol use.   he has a BMI of Body mass index is 36.37 kg/m.Marland Kitchen Filed Weights   12/23/21 1313  Weight: 132 kg    Family History  Problem Relation Age of Onset   Pulmonary Hypertension Mother    Hypertension Mother    CAD Father        s/p CABG x 4   Heart failure Father    Heart failure Sister    Hypertension Sister      Atrial Fibrillation Management history:  Previous antiarrhythmic drugs: none Previous cardioversions: none Previous ablations: none CHADS2VASC score: 3 Anticoagulation history: Eliquis   Past Medical History:  Diagnosis Date   CHF  (congestive heart failure) (HCC)    Dyspnea    History of hip replacement    Hypertension    Past Surgical History:  Procedure Laterality Date   RIGHT/LEFT HEART CATH AND CORONARY ANGIOGRAPHY N/A 10/17/2020   Procedure: RIGHT/LEFT HEART CATH AND CORONARY ANGIOGRAPHY;  Surgeon: Dolores Patty, MD;  Location: [facility];  Service: Cardiovascular;  Laterality: N/A;   TOTAL HIP ARTHROPLASTY      Current Outpatient Medications  Medication Sig Dispense Refill   apixaban (ELIQUIS) 5 MG TABS tablet Take 1 tablet (5 mg total) by mouth 2 (two) times daily. 60 tablet 1   Ascorbic Acid (VITAMIN C) 1000 MG tablet Take 1,000 mg by mouth daily.     atorvastatin (LIPITOR) 40 MG tablet Take 1 tablet (40 mg total) by mouth daily. 90 tablet 3   carvedilol (COREG) 25 MG tablet Take 1 tablet (25 mg total) by mouth 2 (two) times daily. 60 tablet 11   dapagliflozin propanediol (FARXIGA) 10 MG TABS tablet Take 1 tablet (10 mg total) by mouth daily before breakfast. 30 tablet 11   folic acid (FOLVITE) 1 MG tablet TAKE 1 TABLET BY MOUTH EVERY DAY 90 tablet 1   furosemide (LASIX) 40 MG tablet Take furosemide 40 mg daily. Can take extra 40 mg as needed for additional swelling or weight gain. 90 tablet 4   sacubitril-valsartan (ENTRESTO) 97-103 MG Take 1 tablet by mouth 2 (two) times daily. 60 tablet 11   spironolactone (ALDACTONE) 25 MG tablet TAKE 1 TABLET (25 MG  TOTAL) BY MOUTH DAILY. 90 tablet 3   thiamine 100 MG tablet TAKE 1 TABLET BY MOUTH EVERY DAY 90 tablet 3   No current facility-administered medications for this encounter.    Allergies  Allergen Reactions   Shellfish Allergy Shortness Of Breath and Swelling    Throat swelling    Social History   Socioeconomic History   Marital status: Divorced    Spouse name: Not on file   Number of children: Not on file   Years of education: Not on file   Highest education level: Not on file  Occupational History   Not on file  Tobacco Use    Smoking status: Never   Smokeless tobacco: Former    Types: Chew   Tobacco comments:    Former chew/dip 12/23/21  Vaping Use   Vaping Use: Never used  Substance and Sexual Activity   Alcohol use: Yes    Alcohol/week: 4.0 - 8.0 standard drinks    Types: 4 - 8 Cans of beer per week    Comment: 1-2 beers every other day 12/23/21   Drug use: Not Currently   Sexual activity: Not on file  Other Topics Concern   Not on file  Social History Narrative   Not on file   Social Determinants of Health   Financial Resource Strain: Not on file  Food Insecurity: Not on file  Transportation Needs: Not on file  Physical Activity: Not on file  Stress: Not on file  Social Connections: Not on file  Intimate Partner Violence: Not on file     ROS- All systems are reviewed and negative except as per the HPI above.  Physical Exam: Vitals:   12/23/21 1313  BP: 90/78  Pulse: (!) 127  Weight: 132 kg  Height: 6\' 3"  (1.905 m)    GEN- The patient is a well appearing obese male, alert and oriented x 3 today.   Head- normocephalic, atraumatic Eyes-  Sclera clear, conjunctiva pink Ears- hearing intact Oropharynx- clear Neck- supple  Lungs- Clear to ausculation bilaterally, normal work of breathing Heart- Regular rate and rhythm, tachycardia, no murmurs, rubs or gallops  GI- soft, NT, ND, + BS Extremities- no clubbing, cyanosis, or edema MS- no significant deformity or atrophy Skin- no rash or lesion Psych- euthymic mood, full affect Neuro- strength and sensation are intact  Wt Readings from Last 3 Encounters:  12/23/21 132 kg  12/18/21 130.7 kg  09/16/21 128 kg    EKG today demonstrates  Atrial flutter with 2:1 block Vent. rate 127 BPM PR interval * ms QRS duration 100 ms QT/QTcB 336/488 ms  Echo 09/16/21 demonstrated  1. Left ventricular ejection fraction, by estimation, is 60 to 65%. The  left ventricle has normal function. The left ventricle has no regional  wall motion  abnormalities. There is moderate left ventricular hypertrophy. Left ventricular diastolic parameters are consistent with Grade I diastolic dysfunction (impaired relaxation).   2. Right ventricular systolic function is normal. The right ventricular  size is normal. There is normal pulmonary artery systolic pressure.   3. Left atrial size was severely dilated.   4. The mitral valve is normal in structure. Trivial mitral valve  regurgitation. No evidence of mitral stenosis.   5. The aortic valve is tricuspid. There is mild calcification of the  aortic valve. Aortic valve regurgitation is not visualized. No aortic  stenosis is present.   6. The inferior vena cava is normal in size with greater than 50%  respiratory variability, suggesting  right atrial pressure of 3 mmHg.   Epic records are reviewed at length today  CHA2DS2-VASc Score = 3  The patient's score is based upon: CHF History: 1 HTN History: 1 Diabetes History: 0 Stroke History: 0 Vascular Disease History: 1 Age Score: 0 Gender Score: 0       ASSESSMENT AND PLAN: 1. Atrial flutter The patient's CHA2DS2-VASc score is 3, indicating a 3.2% annual risk of stroke.   Patient remains in rapid atrial flutter today.  Will arrange for DCCV (Eliquis started 2/2) No room in BP to add additional rate control.  Continue carvedilol 25 mg BID Continue Eliquis 5 mg BID ED precautions given if he has clinical deterioration prior to scheduled DCCV.   2. Secondary Hypercoagulable State (ICD10:  D68.69) The patient is at significant risk for stroke/thromboembolism based upon his CHA2DS2-VASc Score of 3.  Continue Apixaban (Eliquis).   3. Obesity Body mass index is 36.37 kg/m. Lifestyle modification was discussed at length including regular exercise and weight reduction.  4. Obstructive sleep apnea Patient intolerant of CPAP.  5. Chronic systolic CHF EF recovered to 55-60% Continue Lasix 40 mg daily with extra dose PRN for swelling.    6. CAD No anginal symptoms.  7. HTN Stable, no changes today.   Follow up in the AF clinic post DCCV.    [facility] [address] [place], [place] [address] [phone] 12/23/2021 2:06 PM

## 2021-12-23 NOTE — Patient Instructions (Signed)
Cardioversion scheduled for Thursday, February 23rd  -come to clinic for labs at [place] at the [facility] and go to admitting at 930AM  - Do not eat or drink anything after midnight the night prior to your procedure.  - Take all your morning medication (except diabetic medications) with a sip of water prior to arrival.  - You will not be able to drive home after your procedure.  - Do NOT miss any doses of your blood thinner - if you should miss a dose please notify our office immediately.  - If you feel as if you go back into normal rhythm prior to scheduled cardioversion, please notify our office immediately. If your procedure is canceled in the cardioversion suite you will be charged a cancellation fee.

## 2021-12-31 ENCOUNTER — Encounter (HOSPITAL_COMMUNITY): Payer: Self-pay | Admitting: Cardiology

## 2022-01-08 ENCOUNTER — Other Ambulatory Visit: Payer: Self-pay

## 2022-01-08 ENCOUNTER — Ambulatory Visit (HOSPITAL_COMMUNITY): Payer: Medicaid Other | Admitting: Anesthesiology

## 2022-01-08 ENCOUNTER — Ambulatory Visit (HOSPITAL_COMMUNITY)
Admission: RE | Admit: 2022-01-08 | Discharge: 2022-01-08 | Disposition: A | Discharge status: Home / Self Care | Payer: Medicaid Other | Attending: Cardiology | Admitting: Cardiology

## 2022-01-08 ENCOUNTER — Ambulatory Visit (HOSPITAL_BASED_OUTPATIENT_CLINIC_OR_DEPARTMENT_OTHER): Payer: Medicaid Other | Admitting: Anesthesiology

## 2022-01-08 ENCOUNTER — Encounter (HOSPITAL_COMMUNITY)
Admission: RE | Disposition: A | Discharge status: Home / Self Care | Payer: Self-pay | Source: Home / Self Care | Attending: Cardiology

## 2022-01-08 ENCOUNTER — Ambulatory Visit (HOSPITAL_COMMUNITY)
Admission: RE | Admit: 2022-01-08 | Discharge: 2022-01-08 | Disposition: A | Discharge status: Home / Self Care | Payer: Medicaid Other | Source: Ambulatory Visit | Attending: Physician Assistant | Admitting: Physician Assistant

## 2022-01-08 ENCOUNTER — Encounter (HOSPITAL_COMMUNITY): Payer: Self-pay | Admitting: Cardiology

## 2022-01-08 DIAGNOSIS — I4892 Unspecified atrial flutter: Secondary | ICD-10-CM

## 2022-01-08 DIAGNOSIS — I509 Heart failure, unspecified: Secondary | ICD-10-CM

## 2022-01-08 DIAGNOSIS — Z91199 Patient's noncompliance with other medical treatment and regimen due to unspecified reason: Secondary | ICD-10-CM | POA: Diagnosis not present

## 2022-01-08 DIAGNOSIS — I110 Hypertensive heart disease with heart failure: Secondary | ICD-10-CM

## 2022-01-08 DIAGNOSIS — G4733 Obstructive sleep apnea (adult) (pediatric): Secondary | ICD-10-CM | POA: Diagnosis not present

## 2022-01-08 DIAGNOSIS — E669 Obesity, unspecified: Secondary | ICD-10-CM | POA: Diagnosis not present

## 2022-01-08 DIAGNOSIS — Z79899 Other long term (current) drug therapy: Secondary | ICD-10-CM | POA: Diagnosis not present

## 2022-01-08 DIAGNOSIS — I2510 Atherosclerotic heart disease of native coronary artery without angina pectoris: Secondary | ICD-10-CM

## 2022-01-08 DIAGNOSIS — Z7901 Long term (current) use of anticoagulants: Secondary | ICD-10-CM | POA: Insufficient documentation

## 2022-01-08 DIAGNOSIS — Z6836 Body mass index (BMI) 36.0-36.9, adult: Secondary | ICD-10-CM | POA: Diagnosis not present

## 2022-01-08 DIAGNOSIS — I5022 Chronic systolic (congestive) heart failure: Secondary | ICD-10-CM | POA: Insufficient documentation

## 2022-01-08 DIAGNOSIS — D6869 Other thrombophilia: Secondary | ICD-10-CM | POA: Insufficient documentation

## 2022-01-08 DIAGNOSIS — I4891 Unspecified atrial fibrillation: Secondary | ICD-10-CM

## 2022-01-08 HISTORY — PX: CARDIOVERSION: SHX1299

## 2022-01-08 LAB — CBC
HCT: 45.9 % (ref 39.0–52.0)
Hemoglobin: 13.8 g/dL (ref 13.0–17.0)
MCH: 25 pg — ABNORMAL LOW (ref 26.0–34.0)
MCHC: 30.1 g/dL (ref 30.0–36.0)
MCV: 83.3 fL (ref 80.0–100.0)
Platelets: 260 10*3/uL (ref 150–400)
RBC: 5.51 MIL/uL (ref 4.22–5.81)
RDW: 17.5 % — ABNORMAL HIGH (ref 11.5–15.5)
WBC: 5.5 10*3/uL (ref 4.0–10.5)
nRBC: 0 % (ref 0.0–0.2)

## 2022-01-08 LAB — BASIC METABOLIC PANEL
Anion gap: 10 (ref 5–15)
BUN: 29 mg/dL — ABNORMAL HIGH (ref 8–23)
CO2: 29 mmol/L (ref 22–32)
Calcium: 9.3 mg/dL (ref 8.9–10.3)
Chloride: 99 mmol/L (ref 98–111)
Creatinine, Ser: 1.67 mg/dL — ABNORMAL HIGH (ref 0.61–1.24)
GFR, Estimated: 46 mL/min — ABNORMAL LOW (ref >60)
Glucose, Bld: 112 mg/dL — ABNORMAL HIGH (ref 70–99)
Potassium: 5 mmol/L (ref 3.5–5.1)
Sodium: 138 mmol/L (ref 135–145)

## 2022-01-08 SURGERY — CARDIOVERSION
Anesthesia: General

## 2022-01-08 MED ORDER — LIDOCAINE 2% (20 MG/ML) 5 ML SYRINGE
INTRAMUSCULAR | Status: DC | PRN
  Administered 2022-01-08: 100 mg via INTRAVENOUS

## 2022-01-08 MED ORDER — SODIUM CHLORIDE 0.9 % IV SOLN: INTRAVENOUS | Status: DC

## 2022-01-08 MED ORDER — PROPOFOL 10 MG/ML IV BOLUS
INTRAVENOUS | Status: DC | PRN
  Administered 2022-01-08: 70 mg via INTRAVENOUS

## 2022-01-08 NOTE — Interval H&P Note (Signed)
History and Physical Interval Note:  01/08/2022 9:47 AM  Paul [name]  has presented today for surgery, with the diagnosis of AFIB.  The various methods of treatment have been discussed with the patient and family. After consideration of risks, benefits and other options for treatment, the patient has consented to  Procedure(s): CARDIOVERSION (N/A) as a surgical intervention.  The patient's history has been reviewed, patient examined, no change in status, stable for surgery.  I have reviewed the patient's chart and labs.  Questions were answered to the patient's satisfaction.     Laylee Schooley Harrell Gave

## 2022-01-08 NOTE — CV Procedure (Signed)
Procedure:   DCCV  Indication:  Symptomatic atrial flutter/atrial fibrillation  Procedure Note:  The patient signed informed consent.  They have had had therapeutic anticoagulation with apixaban greater than 3 weeks.  Anesthesia was administered by Dr. Tacy Dura.  Patient received 100 mg IV lidocaine and 70 mg IV propofol.Adequate airway was maintained throughout and vital followed per protocol.  They were cardioverted x 1 with 150J of biphasic synchronized energy.  They converted to NSR.  There were no apparent complications.  The patient had normal neuro status and respiratory status post procedure with vitals stable as recorded elsewhere.    Follow up:  They will continue on current medical therapy and follow up with cardiology as scheduled.  Jodelle Red, MD PhD 01/08/2022 10:49 AM

## 2022-01-08 NOTE — Anesthesia Procedure Notes (Signed)
Procedure Name: General with mask airway Date/Time: 01/08/2022 10:40 AM Performed by: Erick Colace, CRNA Pre-anesthesia Checklist: Patient identified, Suction available, Emergency Drugs available, Patient being monitored and Timeout performed Patient Re-evaluated:Patient Re-evaluated prior to induction Oxygen Delivery Method: Circle system utilized Preoxygenation: Pre-oxygenation with 100% oxygen Induction Type: IV induction Dental Injury: Teeth and Oropharynx as per pre-operative assessment

## 2022-01-08 NOTE — Discharge Instructions (Signed)

## 2022-01-08 NOTE — Anesthesia Preprocedure Evaluation (Addendum)
Anesthesia Evaluation  Patient identified by MRN, date of birth, ID band Patient awake    Reviewed: Allergy & Precautions, H&P , NPO status , Patient's Chart, lab work & pertinent test results, reviewed documented beta blocker date and time   Airway Mallampati: I  TM Distance: >3 FB Neck ROM: full    Dental no notable dental hx. (+) Teeth Intact, Dental Advisory Given   Pulmonary neg pulmonary ROS,    Pulmonary exam normal breath sounds clear to auscultation       Cardiovascular Exercise Tolerance: Good hypertension, Pt. on medications + CAD and +CHF  + dysrhythmias Atrial Fibrillation  Rhythm:regular Rate:Normal  ECHO 22 1. Left ventricular ejection fraction, by estimation, is 60 to 65%. The  left ventricle has normal function. The left ventricle has no regional  wall motion abnormalities. There is moderate left ventricular hypertrophy.  Left ventricular diastolic  parameters are consistent with Grade I diastolic dysfunction (impaired  relaxation).  2. Right ventricular systolic function is normal. The right ventricular  size is normal. There is normal pulmonary artery systolic pressure.  3. Left atrial size was severely dilated.  4. The mitral valve is normal in structure. Trivial mitral valve  regurgitation. No evidence of mitral stenosis.  5. The aortic valve is tricuspid. There is mild calcification of the  aortic valve. Aortic valve regurgitation is not visualized. No aortic  stenosis is present.  6. The inferior vena cava is normal in size with greater than 50%  respiratory variability, suggesting right atrial pressure of 3 mmHg.    Neuro/Psych negative neurological ROS  negative psych ROS   GI/Hepatic negative GI ROS, Neg liver ROS,   Endo/Other  negative endocrine ROS  Renal/GU Renal disease  negative genitourinary   Musculoskeletal   Abdominal   Peds  Hematology  (+) Blood dyscrasia, anemia ,    Anesthesia Other Findings   Reproductive/Obstetrics negative OB ROS                            Anesthesia Physical Anesthesia Plan  ASA: 3  Anesthesia Plan: General   Post-op Pain Management: Minimal or no pain anticipated   Induction: Intravenous  PONV Risk Score and Plan: 2 and Propofol infusion  Airway Management Planned: Nasal Cannula, Natural Airway, Simple Face Mask and Mask  Additional Equipment: None  Intra-op Plan:   Post-operative Plan:   Informed Consent: I have reviewed the patients History and Physical, chart, labs and discussed the procedure including the risks, benefits and alternatives for the proposed anesthesia with the patient or authorized representative who has indicated his/her understanding and acceptance.     Dental Advisory Given  Plan Discussed with: CRNA and Anesthesiologist  Anesthesia Plan Comments:         Anesthesia Quick Evaluation

## 2022-01-08 NOTE — Transfer of Care (Signed)
Immediate Anesthesia Transfer of Care Note  Patient: Paul [name]  Procedure(s) Performed: CARDIOVERSION  Patient Location: Short Stay  Anesthesia Type:General  Level of Consciousness: awake  Airway & Oxygen Therapy: Patient Spontanous Breathing  Post-op Assessment: Report given to RN and Post -op Vital signs reviewed and stable  Post vital signs: Reviewed and stable  Last Vitals:  Vitals Value Taken Time  BP 103/70   Temp    Pulse 77   Resp 12   SpO2 100     Last Pain:  Vitals:   01/08/22 0931  PainSc: 0-No pain         Complications: No notable events documented.

## 2022-01-09 ENCOUNTER — Encounter (HOSPITAL_COMMUNITY): Payer: Self-pay | Admitting: Cardiology

## 2022-01-09 NOTE — Anesthesia Postprocedure Evaluation (Signed)
Anesthesia Post Note  Patient: Paul [name]  Procedure(s) Performed: CARDIOVERSION     Patient location during evaluation: PACU Anesthesia Type: General Level of consciousness: awake and alert Pain management: pain level controlled Vital Signs Assessment: post-procedure vital signs reviewed and stable Respiratory status: spontaneous breathing, nonlabored ventilation, respiratory function stable and patient connected to nasal cannula oxygen Cardiovascular status: blood pressure returned to baseline and stable Postop Assessment: no apparent nausea or vomiting Anesthetic complications: no   No notable events documented.  Last Vitals:  Vitals:   01/08/22 1110 01/08/22 1120  BP: 107/78 102/62  Pulse: 74 76  Resp: 18 15  Temp:    SpO2: 100% 100%    Last Pain:  Vitals:   01/08/22 1120  TempSrc:   PainSc: 0-No pain                 Malai Lady

## 2022-01-14 ENCOUNTER — Other Ambulatory Visit: Payer: Self-pay | Admitting: Student

## 2022-01-15 ENCOUNTER — Other Ambulatory Visit: Payer: Self-pay

## 2022-01-15 ENCOUNTER — Ambulatory Visit (HOSPITAL_COMMUNITY)
Admission: RE | Admit: 2022-01-15 | Discharge: 2022-01-15 | Disposition: A | Discharge status: Home / Self Care | Payer: Medicaid Other | Source: Ambulatory Visit | Attending: Physician Assistant | Admitting: Physician Assistant

## 2022-01-15 ENCOUNTER — Encounter (HOSPITAL_COMMUNITY): Payer: Self-pay | Admitting: Physician Assistant

## 2022-01-15 VITALS — BP 110/88 | HR 80 | Ht 75.0 in | Wt 291.4 lb

## 2022-01-15 DIAGNOSIS — I5022 Chronic systolic (congestive) heart failure: Secondary | ICD-10-CM | POA: Insufficient documentation

## 2022-01-15 DIAGNOSIS — G4733 Obstructive sleep apnea (adult) (pediatric): Secondary | ICD-10-CM | POA: Insufficient documentation

## 2022-01-15 DIAGNOSIS — Z6836 Body mass index (BMI) 36.0-36.9, adult: Secondary | ICD-10-CM | POA: Insufficient documentation

## 2022-01-15 DIAGNOSIS — I2510 Atherosclerotic heart disease of native coronary artery without angina pectoris: Secondary | ICD-10-CM | POA: Diagnosis not present

## 2022-01-15 DIAGNOSIS — E669 Obesity, unspecified: Secondary | ICD-10-CM | POA: Diagnosis not present

## 2022-01-15 DIAGNOSIS — I4892 Unspecified atrial flutter: Secondary | ICD-10-CM | POA: Insufficient documentation

## 2022-01-15 DIAGNOSIS — I484 Atypical atrial flutter: Secondary | ICD-10-CM

## 2022-01-15 DIAGNOSIS — I110 Hypertensive heart disease with heart failure: Secondary | ICD-10-CM | POA: Diagnosis not present

## 2022-01-15 DIAGNOSIS — D6869 Other thrombophilia: Secondary | ICD-10-CM | POA: Diagnosis not present

## 2022-01-15 DIAGNOSIS — F1021 Alcohol dependence, in remission: Secondary | ICD-10-CM | POA: Diagnosis not present

## 2022-01-15 DIAGNOSIS — Z7901 Long term (current) use of anticoagulants: Secondary | ICD-10-CM | POA: Diagnosis not present

## 2022-01-15 MED ORDER — FUROSEMIDE 40 MG PO TABS: 40.0000 mg | ORAL_TABLET | Freq: Two times a day (BID) | ORAL | 3 refills | Status: DC

## 2022-01-15 NOTE — Progress Notes (Signed)
Primary Care Physician: Lacinda Axon, MD Primary Cardiologist: Dr Haroldine Laws Primary Electrophysiologist: none Referring Physician: Dr Festus Barren Paul [name] is a 61 y.o. male with a history of chronic systolic CHF, CAD, HTN, OSA, atrial flutter who presents for follow up in the [facility]. The patient was initially diagnosed with atrial flutter after presenting to his PCP with symptoms of lightheadedness and palpitations over the previous 2-3 weeks. ECG showed atrial flutter with 2:1 block. His carvedilol was increased and he was started on Eliquis for a CHADS2VASC score of 3.   On follow up today, patient is s/p DCCV 01/08/22. He reports that he feels "much better" now that he is back in SR. He states he sleeps better and has more energy. No bleeding issues on anticoagulation.   Today, he denies symptoms of palpitations, chest pain, shortness of breath, orthopnea, PND, lower extremity edema, dizziness, presyncope, syncope, bleeding, or neurologic sequela. The patient is tolerating medications without difficulties and is otherwise without complaint today.    Atrial Fibrillation Risk Factors:  he does have symptoms or diagnosis of sleep apnea. he is not compliant with CPAP therapy. he does not have a history of rheumatic fever. he does have a history of alcohol use.   he has a BMI of Body mass index is 36.42 kg/m.Marland Kitchen Filed Weights   01/15/22 1304  Weight: 132.2 kg     Family History  Problem Relation Age of Onset   Pulmonary Hypertension Mother    Hypertension Mother    CAD Father        s/p CABG x 4   Heart failure Father    Heart failure Sister    Hypertension Sister      Atrial Fibrillation Management history:  Previous antiarrhythmic drugs: none Previous cardioversions: 01/08/22 Previous ablations: none CHADS2VASC score: 3 Anticoagulation history: Eliquis   Past Medical History:  Diagnosis Date   CHF (congestive heart  failure) (HCC)    Dyspnea    History of hip replacement    Hypertension    Past Surgical History:  Procedure Laterality Date   CARDIOVERSION N/A 01/08/2022   Procedure: CARDIOVERSION;  Surgeon: Buford Dresser, MD;  Location: [facility] ENDOSCOPY;  Service: Cardiovascular;  Laterality: N/A;   RIGHT/LEFT HEART CATH AND CORONARY ANGIOGRAPHY N/A 10/17/2020   Procedure: RIGHT/LEFT HEART CATH AND CORONARY ANGIOGRAPHY;  Surgeon: Jolaine Artist, MD;  Location: [facility] LAB;  Service: Cardiovascular;  Laterality: N/A;   TOTAL HIP ARTHROPLASTY      Current Outpatient Medications  Medication Sig Dispense Refill   Ascorbic Acid (VITAMIN C) 1000 MG tablet Take 1,000 mg by mouth daily.     atorvastatin (LIPITOR) 40 MG tablet Take 1 tablet (40 mg total) by mouth daily. 90 tablet 3   carvedilol (COREG) 25 MG tablet Take 1 tablet (25 mg total) by mouth 2 (two) times daily. 60 tablet 11   dapagliflozin propanediol (FARXIGA) 10 MG TABS tablet Take 1 tablet (10 mg total) by mouth daily before breakfast. 30 tablet 11   ELIQUIS 5 MG TABS tablet TAKE 1 TABLET BY MOUTH TWICE A DAY 60 tablet 3   folic acid (FOLVITE) 1 MG tablet TAKE 1 TABLET BY MOUTH EVERY DAY 90 tablet 1   ibuprofen (ADVIL) 200 MG tablet Take 400-600 mg by mouth every 8 (eight) hours as needed for moderate pain.     sacubitril-valsartan (ENTRESTO) 97-103 MG Take 1 tablet by mouth 2 (two) times daily. 60 tablet 11  spironolactone (ALDACTONE) 25 MG tablet TAKE 1 TABLET (25 MG TOTAL) BY MOUTH DAILY. 90 tablet 3   thiamine 100 MG tablet TAKE 1 TABLET BY MOUTH EVERY DAY 90 tablet 3   furosemide (LASIX) 40 MG tablet Take 1 tablet (40 mg total) by mouth 2 (two) times daily. Take furosemide 40 mg daily. Can take extra 40 mg as needed for additional swelling or weight gain. 180 tablet 3   No current facility-administered medications for this encounter.    Allergies  Allergen Reactions   Shellfish Allergy Anaphylaxis, Shortness Of Breath  and Swelling    Throat swelling    Social History   Socioeconomic History   Marital status: Divorced    Spouse name: Not on file   Number of children: Not on file   Years of education: Not on file   Highest education level: Not on file  Occupational History   Not on file  Tobacco Use   Smoking status: Never   Smokeless tobacco: Former    Types: Chew   Tobacco comments:    Former chew/dip 12/23/21  Vaping Use   Vaping Use: Never used  Substance and Sexual Activity   Alcohol use: Yes    Alcohol/week: 4.0 - 8.0 standard drinks    Types: 4 - 8 Cans of beer per week    Comment: 1-2 beers every other day 12/23/21   Drug use: Not Currently   Sexual activity: Not on file  Other Topics Concern   Not on file  Social History Narrative   Not on file   Social Determinants of Health   Financial Resource Strain: Not on file  Food Insecurity: Not on file  Transportation Needs: Not on file  Physical Activity: Not on file  Stress: Not on file  Social Connections: Not on file  Intimate Partner Violence: Not on file     ROS- All systems are reviewed and negative except as per the HPI above.  Physical Exam: Vitals:   01/15/22 1304  BP: 110/88  Pulse: 80  Weight: 132.2 kg  Height: 6\' 3"  (1.905 m)    GEN- The patient is a well appearing obese male, alert and oriented x 3 today.   HEENT-head normocephalic, atraumatic, sclera clear, conjunctiva pink, hearing intact, trachea midline. Lungs- Clear to ausculation bilaterally, normal work of breathing Heart- Regular rate and rhythm, no murmurs, rubs or gallops  GI- soft, NT, ND, + BS Extremities- no clubbing, cyanosis, or edema MS- no significant deformity or atrophy Skin- no rash or lesion Psych- euthymic mood, full affect Neuro- strength and sensation are intact   Wt Readings from Last 3 Encounters:  01/15/22 132.2 kg  12/23/21 132 kg  12/18/21 130.7 kg    EKG today demonstrates  SR, PVC Vent. rate 80 BPM PR  interval 194 ms QRS duration 80 ms QT/QTcB 392/452 ms  Echo 09/16/21 demonstrated  1. Left ventricular ejection fraction, by estimation, is 60 to 65%. The  left ventricle has normal function. The left ventricle has no regional  wall motion abnormalities. There is moderate left ventricular hypertrophy. Left ventricular diastolic parameters are consistent with Grade I diastolic dysfunction (impaired relaxation).   2. Right ventricular systolic function is normal. The right ventricular  size is normal. There is normal pulmonary artery systolic pressure.   3. Left atrial size was severely dilated.   4. The mitral valve is normal in structure. Trivial mitral valve  regurgitation. No evidence of mitral stenosis.   5. The aortic valve is  tricuspid. There is mild calcification of the  aortic valve. Aortic valve regurgitation is not visualized. No aortic  stenosis is present.   6. The inferior vena cava is normal in size with greater than 50%  respiratory variability, suggesting right atrial pressure of 3 mmHg.   Epic records are reviewed at length today  CHA2DS2-VASc Score = 3  The patient's score is based upon: CHF History: 1 HTN History: 1 Diabetes History: 0 Stroke History: 0 Vascular Disease History: 1 Age Score: 0 Gender Score: 0       ASSESSMENT AND PLAN: 1. Atrial flutter The patient's CHA2DS2-VASc score is 3, indicating a 3.2% annual risk of stroke.   S/p DCCV on 01/08/22 Patient back in SR.  Continue carvedilol 25 mg BID Continue Eliquis 5 mg BID  2. Secondary Hypercoagulable State (ICD10:  D68.69) The patient is at significant risk for stroke/thromboembolism based upon his CHA2DS2-VASc Score of 3.  Continue Apixaban (Eliquis).   3. Obesity Body mass index is 36.42 kg/m. Lifestyle modification was discussed and encouraged including regular physical activity and weight reduction.  4. Obstructive sleep apnea Patient intolerant of CPAP.  5. Chronic systolic CHF EF  recovered to 55-60% Appears euvolemic today.   6. CAD No anginal symptoms.  7. HTN Stable, no changes today   Follow up with Dr Haroldine Laws per recall. Sooner in AF clinic if he has recurrence of his arrhythmia.    [facility] [address] [place], [place] [address] [phone] 01/15/2022 1:22 PM

## 2022-03-25 ENCOUNTER — Telehealth: Payer: Self-pay | Admitting: *Deleted

## 2022-03-25 NOTE — Telephone Encounter (Signed)
I called pt to inform him of Dr Bensimhon's response to go to the ER to be evaluated. With hesitation, he stated he will go. ?

## 2022-03-25 NOTE — Telephone Encounter (Signed)
Call from pt c/o HR of 133. BP of 104/71 and 89/59. States having no energy. No available appts until Friday - advised pt to call cardiology. Stated he called x 2 with no return call. Denies shob and lightheadedness. Advised pt not to over exert himself and try calling their office again. Stated he will. Advised to go to the ED if HR continues to be elevated,dev SHOB/lightheadedness - states he understands. ?

## 2022-04-21 ENCOUNTER — Other Ambulatory Visit: Payer: Self-pay

## 2022-04-21 ENCOUNTER — Ambulatory Visit (HOSPITAL_COMMUNITY): Payer: Medicaid Other | Attending: Internal Medicine

## 2022-04-21 ENCOUNTER — Telehealth: Payer: Self-pay

## 2022-04-21 ENCOUNTER — Encounter: Payer: Self-pay | Admitting: Student

## 2022-04-21 ENCOUNTER — Ambulatory Visit (INDEPENDENT_AMBULATORY_CARE_PROVIDER_SITE_OTHER): Payer: Medicaid Other | Admitting: Student

## 2022-04-21 VITALS — BP 129/89 | HR 126 | Temp 97.9°F | Ht 75.0 in | Wt 286.8 lb

## 2022-04-21 DIAGNOSIS — I110 Hypertensive heart disease with heart failure: Secondary | ICD-10-CM

## 2022-04-21 DIAGNOSIS — R000 Tachycardia, unspecified: Secondary | ICD-10-CM | POA: Diagnosis not present

## 2022-04-21 DIAGNOSIS — Z87891 Personal history of nicotine dependence: Secondary | ICD-10-CM

## 2022-04-21 DIAGNOSIS — R9431 Abnormal electrocardiogram [ECG] [EKG]: Secondary | ICD-10-CM | POA: Insufficient documentation

## 2022-04-21 DIAGNOSIS — Z0000 Encounter for general adult medical examination without abnormal findings: Secondary | ICD-10-CM

## 2022-04-21 DIAGNOSIS — I5082 Biventricular heart failure: Secondary | ICD-10-CM | POA: Diagnosis not present

## 2022-04-21 DIAGNOSIS — I454 Nonspecific intraventricular block: Secondary | ICD-10-CM | POA: Insufficient documentation

## 2022-04-21 DIAGNOSIS — I4892 Unspecified atrial flutter: Secondary | ICD-10-CM | POA: Diagnosis present

## 2022-04-21 DIAGNOSIS — I10 Essential (primary) hypertension: Secondary | ICD-10-CM

## 2022-04-21 DIAGNOSIS — Z1211 Encounter for screening for malignant neoplasm of colon: Secondary | ICD-10-CM

## 2022-04-21 MED ORDER — CARVEDILOL 25 MG PO TABS: 25.0000 mg | ORAL_TABLET | Freq: Two times a day (BID) | ORAL | 11 refills | Status: DC

## 2022-04-21 MED ORDER — DILTIAZEM HCL ER COATED BEADS 180 MG PO CP24: 180.0000 mg | ORAL_CAPSULE | Freq: Every day | ORAL | 5 refills | Status: DC

## 2022-04-21 MED ORDER — APIXABAN 5 MG PO TABS
5.0000 mg | ORAL_TABLET | Freq: Two times a day (BID) | ORAL | 5 refills | Status: DC
Start: 2022-04-21 — End: 2022-11-19

## 2022-04-21 MED ORDER — SACUBITRIL-VALSARTAN 97-103 MG PO TABS: 1.0000 | ORAL_TABLET | Freq: Two times a day (BID) | ORAL | 11 refills | Status: DC

## 2022-04-21 NOTE — Assessment & Plan Note (Signed)
Patient remains stable from heart failure perspective. Weight is down 2 pounds since last office visit. Patient denies any dyspnea on exertion, PND or leg swelling. States he is out of refill for his Entresto and Coreg. Patient found to be tachycardic on exam but no rales on auscultation of lungs.  Plan: -Continue Lasix 40 mg twice daily -Refill Entresto and Coreg. -Continue Farxiga spironolactone -Follow-up with Dr. Gala Romney as needed.

## 2022-04-21 NOTE — Assessment & Plan Note (Signed)
Referred to GI for colonoscopy 

## 2022-04-21 NOTE — Telephone Encounter (Signed)
DECISION :   Outcome  Approved today  Approved. This drug has been approved. Approved quantity: 60 tablets per 30 day(s).   You may fill up to a 34 day supply at a retail pharmacy. You may fill up to a 90 day supply for maintenance drugs, please refer to the formulary for details. Please call the pharmacy to process your prescription claim.   Drug Entresto 97-103MG  tablets   Form [employer] Prior Authorization Request Form [id number] NCPDP)     (COPY SENT  TO PHARMACY ALSO )

## 2022-04-21 NOTE — Patient Instructions (Signed)
Thank you, Paul [name] [name] for allowing [place] to provide your care today. Today we discussed your heart rate and blood pressure.  EKG showed that you are back in a flutter with heart rate in the 120s.  I am starting you on a new medicine to help control your heart rate as well as heart rhythm.  I will reach out to Dr. Gala Romney to discuss further management of your A-flutter.  Please go to the ER if you start getting more dizzy, feeling like passing out or shortness of breath.   I have ordered the following tests: EKG  I have ordered the following medication/changed the following medications:  Start diltiazem 180 mg daily  My Chart Access: [email or web address]  Please follow-up in 2-3 weeks  Please make sure to arrive 15 minutes prior to your next appointment. If you arrive late, you may be asked to reschedule.    We look forward to seeing you next time. Please call our clinic at [phone] if you have any questions or concerns. The best time to call is Monday-Friday from 9am-4pm, but there is someone available 24/7. If after hours or the weekend, call the main hospital number and ask for the Internal Medicine Resident On-Call. If you need medication refills, please notify your pharmacy one week in advance and they will send [place] a request.   Thank you for letting us take part in your care. Wishing you the best!  Steffanie Rainwater, MD 04/21/2022, 11:50 AM IM Resident, PGY-2 Duwayne Heck 41:10

## 2022-04-21 NOTE — Assessment & Plan Note (Signed)
Blood pressure remained stable with current regimen.  Vitals:   04/21/22 1101  BP: 129/89  -Continue Coreg 25 mg twice daily, Entresto 97-103 mg twice daily, spironolactone 25 mg daily and Lasix 40 mg twice daily.  -Repeat BMP at next office visit

## 2022-04-21 NOTE — Assessment & Plan Note (Signed)
Patient with a history of a flutter status post successful cardioversion on February 23rd. Patient states he has remained in normal sinus and has been feeling well until the past 2 to 3 weeks after having arguments with his spouse.  Patient states these arguments have increase his stress level and heart rate has been up in the 120s. He has not missed any dose of his Coreg. He does not have any significant symptoms but endorses lightheadedness when he endorses himself. He endorsed occasional palpitations but denies any chest pain, diaphoresis, presyncope, headaches, nausea or blurry vision.  He reports doing breathing exercises to keep himself calm and get his heart rate down.  The last few weeks, his heart rate has ranged from the 100s to the 120s. He feels fine today. On exam patient has tachycardia but regular rhythm and no other abnormalities.  EKG today showed a flutter with 2:1 AV conduction.  In the setting of patient converted back into a flutter with reported symptoms of lightheadedness and palpitations, plan to start patient on diltiazem having follow-up closely with A-fib clinic and/or advanced heart failure clinic.  Unable to reach Dr. Gala Romney via secure chat.  Called A-fib clinic to get patient an appointment this week.  Plan: -Start diltiazem 180 mg daily -Refilled Coreg 25 mg twice daily -Refilled Eliquis 5 mg twice daily -Patient scheduled with A-fib clinic tomorrow at 11:30 AM.  -Patient also instructed to follow-up with Dr. Gala Romney as needed

## 2022-04-21 NOTE — Telephone Encounter (Signed)
Pa for  pt ( ENTRESTO ) came through on cover my meds was submitted  with office  notes .. awaiting approval or denial

## 2022-04-21 NOTE — Progress Notes (Signed)
   CC: Follow-up  HPI:  [name] is a 61 y.o. male with PMH as below presenting to clinic for evaluation of symptomatic tachycardia. Please see problem based charting for evaluation, assessment and plan.  Past Medical History:  Diagnosis Date   CHF (congestive heart failure) (HCC)    Dyspnea    History of hip replacement    Hypertension     Review of Systems:  Constitutional: Negative for fatigue, syncope, weight changes or fever. Eyes: Negative for blurry vision. Respiratory: Negative for shortness of breath or dyspnea on exertion Cardiac: Negative for chest pain. Positive for occasional palpitations. MSK: Negative for back pain Neuro: Positive for occasional dizziness. Negative for headache or weakness Psych: Positive for stress  Physical Exam: General: Pleasant, well-appearing elderly man sitting in chair.  No acute distress. Cardiac: Tachycardic. Regular rhythm. No murmurs, rubs or gallops. No LE edema Respiratory: Lungs CTAB. No wheezing or crackles. Abdominal: Soft, symmetric and non tender. Normal BS. Skin: Warm, dry and intact without rashes or lesions Extremities: Atraumatic. Full ROM. Palpable radial and DP pulses. Neuro: A&O x 3. Moves all extremities. Normal sensation to gross touch. Psych: Appropriate mood and affect.  Vitals:   04/21/22 1101  BP: 129/89  Pulse: (!) 126  Temp: 97.9 F (36.6 C)  TempSrc: Oral  SpO2: 100%  Weight: 286 lb 12.8 oz (130.1 kg)  Height: 6\' 3"  (1.905 m)    Assessment & Plan:   See Encounters Tab for problem based charting.  Patient discussed with Dr.  Thomes Cake, MD, MPH

## 2022-04-22 ENCOUNTER — Ambulatory Visit (HOSPITAL_COMMUNITY)
Admission: RE | Admit: 2022-04-22 | Discharge: 2022-04-22 | Disposition: A | Discharge status: Home / Self Care | Payer: Medicaid Other | Source: Ambulatory Visit | Attending: Physician Assistant | Admitting: Physician Assistant

## 2022-04-22 VITALS — BP 116/94 | HR 128 | Ht 75.0 in | Wt 287.8 lb

## 2022-04-22 DIAGNOSIS — I5022 Chronic systolic (congestive) heart failure: Secondary | ICD-10-CM | POA: Insufficient documentation

## 2022-04-22 DIAGNOSIS — D6869 Other thrombophilia: Secondary | ICD-10-CM | POA: Diagnosis not present

## 2022-04-22 DIAGNOSIS — Z6835 Body mass index (BMI) 35.0-35.9, adult: Secondary | ICD-10-CM | POA: Insufficient documentation

## 2022-04-22 DIAGNOSIS — I4892 Unspecified atrial flutter: Secondary | ICD-10-CM | POA: Diagnosis not present

## 2022-04-22 DIAGNOSIS — I2510 Atherosclerotic heart disease of native coronary artery without angina pectoris: Secondary | ICD-10-CM | POA: Insufficient documentation

## 2022-04-22 DIAGNOSIS — Z7901 Long term (current) use of anticoagulants: Secondary | ICD-10-CM | POA: Diagnosis not present

## 2022-04-22 DIAGNOSIS — E669 Obesity, unspecified: Secondary | ICD-10-CM | POA: Diagnosis not present

## 2022-04-22 DIAGNOSIS — G4733 Obstructive sleep apnea (adult) (pediatric): Secondary | ICD-10-CM | POA: Diagnosis not present

## 2022-04-22 DIAGNOSIS — Z79899 Other long term (current) drug therapy: Secondary | ICD-10-CM | POA: Insufficient documentation

## 2022-04-22 DIAGNOSIS — I110 Hypertensive heart disease with heart failure: Secondary | ICD-10-CM | POA: Insufficient documentation

## 2022-04-22 LAB — BASIC METABOLIC PANEL
Anion gap: 8 (ref 5–15)
BUN: 22 mg/dL (ref 8–23)
CO2: 28 mmol/L (ref 22–32)
Calcium: 9.4 mg/dL (ref 8.9–10.3)
Chloride: 103 mmol/L (ref 98–111)
Creatinine, Ser: 1.41 mg/dL — ABNORMAL HIGH (ref 0.61–1.24)
GFR, Estimated: 57 mL/min — ABNORMAL LOW (ref >60)
Glucose, Bld: 114 mg/dL — ABNORMAL HIGH (ref 70–99)
Potassium: 4.3 mmol/L (ref 3.5–5.1)
Sodium: 139 mmol/L (ref 135–145)

## 2022-04-22 LAB — CBC
HCT: 43.4 % (ref 39.0–52.0)
Hemoglobin: 13.9 g/dL (ref 13.0–17.0)
MCH: 26.6 pg (ref 26.0–34.0)
MCHC: 32 g/dL (ref 30.0–36.0)
MCV: 83 fL (ref 80.0–100.0)
Platelets: 265 10*3/uL (ref 150–400)
RBC: 5.23 MIL/uL (ref 4.22–5.81)
RDW: 19.5 % — ABNORMAL HIGH (ref 11.5–15.5)
WBC: 4.6 10*3/uL (ref 4.0–10.5)
nRBC: 0 % (ref 0.0–0.2)

## 2022-04-22 LAB — MAGNESIUM: Magnesium: 2.2 mg/dL (ref 1.7–2.4)

## 2022-04-22 NOTE — Progress Notes (Addendum)
Primary Care Physician: Lacinda Axon, MD Primary Cardiologist: Dr Haroldine Laws Primary Electrophysiologist: none Referring Physician: Dr Festus Barren Kirkman is a 61 y.o. male with a history of chronic systolic CHF, CAD, HTN, OSA, atrial flutter who presents for follow up in the [facility]. The patient was initially diagnosed with atrial flutter after presenting to his PCP with symptoms of lightheadedness and palpitations over the previous 2-3 weeks. ECG showed atrial flutter with 2:1 block. His carvedilol was increased and he was started on Eliquis for a CHADS2VASC score of 3. Patient is s/p DCCV 01/08/22.   On follow up today, patient was seen at PCP office 04/21/22 and was found to be back in atrial flutter. He has had palpitations for 2-3 weeks prior, fatigue, and intermittent lightheadedness. He went back into atrial flutter after a heated argument with a family member. He denies any missed doses of anticoagulation.   Today, he denies symptoms of chest pain, shortness of breath, orthopnea, PND, lower extremity edema, presyncope, syncope, bleeding, or neurologic sequela. The patient is tolerating medications without difficulties and is otherwise without complaint today.    Atrial Fibrillation Risk Factors:  he does have symptoms or diagnosis of sleep apnea. he is not compliant with CPAP therapy. he does not have a history of rheumatic fever. he does have a history of alcohol use.   he has a BMI of Body mass index is 35.97 kg/m.Marland Kitchen Filed Weights   04/22/22 1145  Weight: 130.5 kg    Family History  Problem Relation Age of Onset   Pulmonary Hypertension Mother    Hypertension Mother    CAD Father        s/p CABG x 4   Heart failure Father    Heart failure Sister    Hypertension Sister      Atrial Fibrillation Management history:  Previous antiarrhythmic drugs: none Previous cardioversions: 01/08/22 Previous ablations: none CHADS2VASC  score: 3 Anticoagulation history: Eliquis   Past Medical History:  Diagnosis Date   CHF (congestive heart failure) (HCC)    Dyspnea    History of hip replacement    Hypertension    Past Surgical History:  Procedure Laterality Date   CARDIOVERSION N/A 01/08/2022   Procedure: CARDIOVERSION;  Surgeon: Buford Dresser, MD;  Location: [facility] ENDOSCOPY;  Service: Cardiovascular;  Laterality: N/A;   RIGHT/LEFT HEART CATH AND CORONARY ANGIOGRAPHY N/A 10/17/2020   Procedure: RIGHT/LEFT HEART CATH AND CORONARY ANGIOGRAPHY;  Surgeon: Jolaine Artist, MD;  Location: [place] CV LAB;  Service: Cardiovascular;  Laterality: N/A;   TOTAL HIP ARTHROPLASTY      Current Outpatient Medications  Medication Sig Dispense Refill   apixaban (ELIQUIS) 5 MG TABS tablet Take 1 tablet (5 mg total) by mouth 2 (two) times daily. 60 tablet 5   Ascorbic Acid (VITAMIN C) 1000 MG tablet Take 1,000 mg by mouth daily.     atorvastatin (LIPITOR) 40 MG tablet Take 1 tablet (40 mg total) by mouth daily. 90 tablet 3   carvedilol (COREG) 25 MG tablet Take 1 tablet (25 mg total) by mouth 2 (two) times daily. 60 tablet 11   dapagliflozin propanediol (FARXIGA) 10 MG TABS tablet Take 1 tablet (10 mg total) by mouth daily before breakfast. 30 tablet 11   diltiazem (CARDIZEM CD) 180 MG 24 hr capsule Take 1 capsule (180 mg total) by mouth daily. 30 capsule 5   folic acid (FOLVITE) 1 MG tablet TAKE 1 TABLET BY MOUTH EVERY  DAY 90 tablet 1   furosemide (LASIX) 40 MG tablet Take 1 tablet (40 mg total) by mouth 2 (two) times daily. Take furosemide 40 mg daily. Can take extra 40 mg as needed for additional swelling or weight gain. 180 tablet 3   ibuprofen (ADVIL) 200 MG tablet Take 400-600 mg by mouth every 8 (eight) hours as needed for moderate pain.     sacubitril-valsartan (ENTRESTO) 97-103 MG Take 1 tablet by mouth 2 (two) times daily. 60 tablet 11   spironolactone (ALDACTONE) 25 MG tablet TAKE 1 TABLET (25 MG TOTAL) BY MOUTH  DAILY. 90 tablet 3   thiamine 100 MG tablet TAKE 1 TABLET BY MOUTH EVERY DAY 90 tablet 3   No current facility-administered medications for this encounter.    Allergies  Allergen Reactions   Shellfish Allergy Anaphylaxis, Shortness Of Breath and Swelling    Throat swelling    Social History   Socioeconomic History   Marital status: Divorced    Spouse name: Not on file   Number of children: Not on file   Years of education: Not on file   Highest education level: Not on file  Occupational History   Not on file  Tobacco Use   Smoking status: Never   Smokeless tobacco: Former    Types: Chew   Tobacco comments:    Former chew/dip 12/23/21  Vaping Use   Vaping Use: Never used  Substance and Sexual Activity   Alcohol use: Yes    Alcohol/week: 4.0 - 8.0 standard drinks    Types: 4 - 8 Cans of beer per week    Comment: 1-2 beers every other day 12/23/21   Drug use: Not Currently   Sexual activity: Not on file  Other Topics Concern   Not on file  Social History Narrative   Not on file   Social Determinants of Health   Financial Resource Strain: Not on file  Food Insecurity: Not on file  Transportation Needs: Not on file  Physical Activity: Not on file  Stress: Not on file  Social Connections: Not on file  Intimate Partner Violence: Not on file     ROS- All systems are reviewed and negative except as per the HPI above.  Physical Exam: Vitals:   04/22/22 1145  BP: (!) 116/94  Pulse: (!) 128  Weight: 130.5 kg  Height: 6\' 3"  (1.905 m)     GEN- The patient is a well appearing obese male, alert and oriented x 3 today.   HEENT-head normocephalic, atraumatic, sclera clear, conjunctiva pink, hearing intact, trachea midline. Lungs- Clear to ausculation bilaterally, normal work of breathing Heart- Regular rate and rhythm, tachycardia, no murmurs, rubs or gallops  GI- soft, NT, ND, + BS Extremities- no clubbing, cyanosis, or edema MS- no significant deformity or  atrophy Skin- no rash or lesion Psych- euthymic mood, full affect Neuro- strength and sensation are intact   Wt Readings from Last 3 Encounters:  04/22/22 130.5 kg  04/21/22 130.1 kg  01/15/22 132.2 kg    EKG today demonstrates  Atrial flutter with 2:1 block Vent. rate 128 BPM PR interval 174 ms QRS duration 144 ms QT/QTcB 286/417 ms  Echo 09/16/21 demonstrated  1. Left ventricular ejection fraction, by estimation, is 60 to 65%. The  left ventricle has normal function. The left ventricle has no regional  wall motion abnormalities. There is moderate left ventricular hypertrophy. Left ventricular diastolic parameters are consistent with Grade I diastolic dysfunction (impaired relaxation).   2. Right ventricular  systolic function is normal. The right ventricular  size is normal. There is normal pulmonary artery systolic pressure.   3. Left atrial size was severely dilated.   4. The mitral valve is normal in structure. Trivial mitral valve  regurgitation. No evidence of mitral stenosis.   5. The aortic valve is tricuspid. There is mild calcification of the  aortic valve. Aortic valve regurgitation is not visualized. No aortic  stenosis is present.   6. The inferior vena cava is normal in size with greater than 50%  respiratory variability, suggesting right atrial pressure of 3 mmHg.   Epic records are reviewed at length today  CHA2DS2-VASc Score = 3  The patient's score is based upon: CHF History: 1 HTN History: 1 Diabetes History: 0 Stroke History: 0 Vascular Disease History: 1 Age Score: 0 Gender Score: 0       ASSESSMENT AND PLAN: 1. Atrial flutter The patient's CHA2DS2-VASc score is 3, indicating a 3.2% annual risk of stroke.   Patient back in rapid atrial flutter. We discussed rhythm control options today. Patient would like to try DCCV alone again. It is likely that he will have recurrent arrhythmias with untreated OSA and severely dilated LA. Would avoid class IC  with h/o CAD. We discussed dofetilide admission today, information given.  Continue carvedilol 25 mg BID Continue diltiazem 180 mg daily Continue Eliquis 5 mg BID Check bmet/mag/cbc today  2. Secondary Hypercoagulable State (ICD10:  D68.69) The patient is at significant risk for stroke/thromboembolism based upon his CHA2DS2-VASc Score of 3.  Continue Apixaban (Eliquis).   3. Obesity Body mass index is 35.97 kg/m. Lifestyle modification was discussed and encouraged including regular physical activity and weight reduction.  4. Obstructive sleep apnea Patient intolerant of CPAP. Looking into Inspire.   5. Chronic systolic CHF EF recovered to 55-60% No signs or symptoms of fluid overload.  6. CAD No anginal symptoms.  7. HTN Stable, no changes today.   Follow up in the AF clinic post DCCV.    [facility] [address] [place], [place] [address] [phone] 04/22/2022 12:25 PM

## 2022-04-22 NOTE — Progress Notes (Signed)
Internal Medicine Clinic Attending  Case discussed with Dr. Amponsah  At the time of the visit.  We reviewed the resident's history and exam and pertinent patient test results.  I agree with the assessment, diagnosis, and plan of care documented in the resident's note.  

## 2022-04-22 NOTE — H&P (View-Only) (Signed)
Primary Care Physician: Lacinda Axon, MD Primary Cardiologist: Dr Haroldine Laws Primary Electrophysiologist: none Referring Physician: Dr Festus Barren Bride is a 61 y.o. male with a history of chronic systolic CHF, CAD, HTN, OSA, atrial flutter who presents for follow up in the [facility]. The patient was initially diagnosed with atrial flutter after presenting to his PCP with symptoms of lightheadedness and palpitations over the previous 2-3 weeks. ECG showed atrial flutter with 2:1 block. His carvedilol was increased and he was started on Eliquis for a CHADS2VASC score of 3. Patient is s/p DCCV 01/08/22.   On follow up today, patient was seen at PCP office 04/21/22 and was found to be back in atrial flutter. He has had palpitations for 2-3 weeks prior, fatigue, and intermittent lightheadedness. He went back into atrial flutter after a heated argument with a family member. He denies any missed doses of anticoagulation.   Today, he denies symptoms of chest pain, shortness of breath, orthopnea, PND, lower extremity edema, presyncope, syncope, bleeding, or neurologic sequela. The patient is tolerating medications without difficulties and is otherwise without complaint today.    Atrial Fibrillation Risk Factors:  he does have symptoms or diagnosis of sleep apnea. he is not compliant with CPAP therapy. he does not have a history of rheumatic fever. he does have a history of alcohol use.   he has a BMI of Body mass index is 35.97 kg/m.Marland Kitchen Filed Weights   04/22/22 1145  Weight: 130.5 kg    Family History  Problem Relation Age of Onset   Pulmonary Hypertension Mother    Hypertension Mother    CAD Father        s/p CABG x 4   Heart failure Father    Heart failure Sister    Hypertension Sister      Atrial Fibrillation Management history:  Previous antiarrhythmic drugs: none Previous cardioversions: 01/08/22 Previous ablations: none CHADS2VASC  score: 3 Anticoagulation history: Eliquis   Past Medical History:  Diagnosis Date   CHF (congestive heart failure) (HCC)    Dyspnea    History of hip replacement    Hypertension    Past Surgical History:  Procedure Laterality Date   CARDIOVERSION N/A 01/08/2022   Procedure: CARDIOVERSION;  Surgeon: Buford Dresser, MD;  Location: [facility] ENDOSCOPY;  Service: Cardiovascular;  Laterality: N/A;   RIGHT/LEFT HEART CATH AND CORONARY ANGIOGRAPHY N/A 10/17/2020   Procedure: RIGHT/LEFT HEART CATH AND CORONARY ANGIOGRAPHY;  Surgeon: Jolaine Artist, MD;  Location: [place] CV LAB;  Service: Cardiovascular;  Laterality: N/A;   TOTAL HIP ARTHROPLASTY      Current Outpatient Medications  Medication Sig Dispense Refill   apixaban (ELIQUIS) 5 MG TABS tablet Take 1 tablet (5 mg total) by mouth 2 (two) times daily. 60 tablet 5   Ascorbic Acid (VITAMIN C) 1000 MG tablet Take 1,000 mg by mouth daily.     atorvastatin (LIPITOR) 40 MG tablet Take 1 tablet (40 mg total) by mouth daily. 90 tablet 3   carvedilol (COREG) 25 MG tablet Take 1 tablet (25 mg total) by mouth 2 (two) times daily. 60 tablet 11   dapagliflozin propanediol (FARXIGA) 10 MG TABS tablet Take 1 tablet (10 mg total) by mouth daily before breakfast. 30 tablet 11   diltiazem (CARDIZEM CD) 180 MG 24 hr capsule Take 1 capsule (180 mg total) by mouth daily. 30 capsule 5   folic acid (FOLVITE) 1 MG tablet TAKE 1 TABLET BY MOUTH EVERY  DAY 90 tablet 1   furosemide (LASIX) 40 MG tablet Take 1 tablet (40 mg total) by mouth 2 (two) times daily. Take furosemide 40 mg daily. Can take extra 40 mg as needed for additional swelling or weight gain. 180 tablet 3   ibuprofen (ADVIL) 200 MG tablet Take 400-600 mg by mouth every 8 (eight) hours as needed for moderate pain.     sacubitril-valsartan (ENTRESTO) 97-103 MG Take 1 tablet by mouth 2 (two) times daily. 60 tablet 11   spironolactone (ALDACTONE) 25 MG tablet TAKE 1 TABLET (25 MG TOTAL) BY MOUTH  DAILY. 90 tablet 3   thiamine 100 MG tablet TAKE 1 TABLET BY MOUTH EVERY DAY 90 tablet 3   No current facility-administered medications for this encounter.    Allergies  Allergen Reactions   Shellfish Allergy Anaphylaxis, Shortness Of Breath and Swelling    Throat swelling    Social History   Socioeconomic History   Marital status: Divorced    Spouse name: Not on file   Number of children: Not on file   Years of education: Not on file   Highest education level: Not on file  Occupational History   Not on file  Tobacco Use   Smoking status: Never   Smokeless tobacco: Former    Types: Chew   Tobacco comments:    Former chew/dip 12/23/21  Vaping Use   Vaping Use: Never used  Substance and Sexual Activity   Alcohol use: Yes    Alcohol/week: 4.0 - 8.0 standard drinks    Types: 4 - 8 Cans of beer per week    Comment: 1-2 beers every other day 12/23/21   Drug use: Not Currently   Sexual activity: Not on file  Other Topics Concern   Not on file  Social History Narrative   Not on file   Social Determinants of Health   Financial Resource Strain: Not on file  Food Insecurity: Not on file  Transportation Needs: Not on file  Physical Activity: Not on file  Stress: Not on file  Social Connections: Not on file  Intimate Partner Violence: Not on file     ROS- All systems are reviewed and negative except as per the HPI above.  Physical Exam: Vitals:   04/22/22 1145  BP: (!) 116/94  Pulse: (!) 128  Weight: 130.5 kg  Height: 6\' 3"  (1.905 m)     GEN- The patient is a well appearing obese male, alert and oriented x 3 today.   HEENT-head normocephalic, atraumatic, sclera clear, conjunctiva pink, hearing intact, trachea midline. Lungs- Clear to ausculation bilaterally, normal work of breathing Heart- Regular rate and rhythm, tachycardia, no murmurs, rubs or gallops  GI- soft, NT, ND, + BS Extremities- no clubbing, cyanosis, or edema MS- no significant deformity or  atrophy Skin- no rash or lesion Psych- euthymic mood, full affect Neuro- strength and sensation are intact   Wt Readings from Last 3 Encounters:  04/22/22 130.5 kg  04/21/22 130.1 kg  01/15/22 132.2 kg    EKG today demonstrates  Atrial flutter with 2:1 block Vent. rate 128 BPM PR interval 174 ms QRS duration 144 ms QT/QTcB 286/417 ms  Echo 09/16/21 demonstrated  1. Left ventricular ejection fraction, by estimation, is 60 to 65%. The  left ventricle has normal function. The left ventricle has no regional  wall motion abnormalities. There is moderate left ventricular hypertrophy. Left ventricular diastolic parameters are consistent with Grade I diastolic dysfunction (impaired relaxation).   2. Right ventricular  systolic function is normal. The right ventricular  size is normal. There is normal pulmonary artery systolic pressure.   3. Left atrial size was severely dilated.   4. The mitral valve is normal in structure. Trivial mitral valve  regurgitation. No evidence of mitral stenosis.   5. The aortic valve is tricuspid. There is mild calcification of the  aortic valve. Aortic valve regurgitation is not visualized. No aortic  stenosis is present.   6. The inferior vena cava is normal in size with greater than 50%  respiratory variability, suggesting right atrial pressure of 3 mmHg.   Epic records are reviewed at length today  CHA2DS2-VASc Score = 3  The patient's score is based upon: CHF History: 1 HTN History: 1 Diabetes History: 0 Stroke History: 0 Vascular Disease History: 1 Age Score: 0 Gender Score: 0       ASSESSMENT AND PLAN: 1. Atrial flutter The patient's CHA2DS2-VASc score is 3, indicating a 3.2% annual risk of stroke.   Patient back in rapid atrial flutter. We discussed rhythm control options today. Patient would like to try DCCV alone again. It is likely that he will have recurrent arrhythmias with untreated OSA and severely dilated LA. Would avoid class IC  with h/o CAD. We discussed dofetilide admission today, information given.  Continue carvedilol 25 mg BID Continue diltiazem 180 mg daily Continue Eliquis 5 mg BID Check bmet/mag/cbc today  2. Secondary Hypercoagulable State (ICD10:  D68.69) The patient is at significant risk for stroke/thromboembolism based upon his CHA2DS2-VASc Score of 3.  Continue Apixaban (Eliquis).   3. Obesity Body mass index is 35.97 kg/m. Lifestyle modification was discussed and encouraged including regular physical activity and weight reduction.  4. Obstructive sleep apnea Patient intolerant of CPAP. Looking into Inspire.   5. Chronic systolic CHF EF recovered to 55-60% No signs or symptoms of fluid overload.  6. CAD No anginal symptoms.  7. HTN Stable, no changes today.   Follow up in the AF clinic post DCCV.    [facility] [address] [place], [place] [address] [phone] 04/22/2022 12:25 PM

## 2022-04-22 NOTE — Patient Instructions (Signed)
Cardioversion scheduled for Thursday, June 15th  - Arrive at the [facility] and go to admitting at 930am  - Do not eat or drink anything after midnight the night prior to your procedure.  - Take all your morning medication (except diabetic medications) with a sip of water prior to arrival.  - You will not be able to drive home after your procedure.  - Do NOT miss any doses of your blood thinner - if you should miss a dose please notify our office immediately.  - If you feel as if you go back into normal rhythm prior to scheduled cardioversion, please notify our office immediately. If your procedure is canceled in the cardioversion suite you will be charged a cancellation fee.

## 2022-04-24 ENCOUNTER — Encounter (HOSPITAL_COMMUNITY): Payer: Self-pay | Admitting: Cardiovascular Disease

## 2022-04-30 ENCOUNTER — Ambulatory Visit (HOSPITAL_BASED_OUTPATIENT_CLINIC_OR_DEPARTMENT_OTHER): Payer: Medicaid Other | Admitting: Certified Registered"

## 2022-04-30 ENCOUNTER — Ambulatory Visit (HOSPITAL_COMMUNITY)
Admission: RE | Admit: 2022-04-30 | Discharge: 2022-04-30 | Disposition: A | Discharge status: Home / Self Care | Payer: Medicaid Other | Source: Ambulatory Visit | Attending: Cardiovascular Disease | Admitting: Cardiovascular Disease

## 2022-04-30 ENCOUNTER — Other Ambulatory Visit: Payer: Self-pay

## 2022-04-30 ENCOUNTER — Ambulatory Visit (HOSPITAL_COMMUNITY): Payer: Medicaid Other | Admitting: Certified Registered"

## 2022-04-30 ENCOUNTER — Encounter (HOSPITAL_COMMUNITY): Payer: Self-pay | Admitting: Cardiovascular Disease

## 2022-04-30 ENCOUNTER — Encounter (HOSPITAL_COMMUNITY)
Admission: RE | Disposition: A | Discharge status: Home / Self Care | Payer: Self-pay | Source: Ambulatory Visit | Attending: Cardiovascular Disease

## 2022-04-30 DIAGNOSIS — Z6835 Body mass index (BMI) 35.0-35.9, adult: Secondary | ICD-10-CM | POA: Diagnosis not present

## 2022-04-30 DIAGNOSIS — Z87891 Personal history of nicotine dependence: Secondary | ICD-10-CM | POA: Diagnosis not present

## 2022-04-30 DIAGNOSIS — I2510 Atherosclerotic heart disease of native coronary artery without angina pectoris: Secondary | ICD-10-CM

## 2022-04-30 DIAGNOSIS — I509 Heart failure, unspecified: Secondary | ICD-10-CM

## 2022-04-30 DIAGNOSIS — Z79899 Other long term (current) drug therapy: Secondary | ICD-10-CM | POA: Insufficient documentation

## 2022-04-30 DIAGNOSIS — E669 Obesity, unspecified: Secondary | ICD-10-CM | POA: Diagnosis not present

## 2022-04-30 DIAGNOSIS — I5022 Chronic systolic (congestive) heart failure: Secondary | ICD-10-CM | POA: Insufficient documentation

## 2022-04-30 DIAGNOSIS — D6869 Other thrombophilia: Secondary | ICD-10-CM | POA: Insufficient documentation

## 2022-04-30 DIAGNOSIS — Z7901 Long term (current) use of anticoagulants: Secondary | ICD-10-CM | POA: Diagnosis not present

## 2022-04-30 DIAGNOSIS — G4733 Obstructive sleep apnea (adult) (pediatric): Secondary | ICD-10-CM | POA: Insufficient documentation

## 2022-04-30 DIAGNOSIS — I4892 Unspecified atrial flutter: Secondary | ICD-10-CM | POA: Insufficient documentation

## 2022-04-30 DIAGNOSIS — I110 Hypertensive heart disease with heart failure: Secondary | ICD-10-CM | POA: Diagnosis not present

## 2022-04-30 DIAGNOSIS — I484 Atypical atrial flutter: Secondary | ICD-10-CM | POA: Diagnosis not present

## 2022-04-30 HISTORY — PX: CARDIOVERSION: SHX1299

## 2022-04-30 SURGERY — CARDIOVERSION
Anesthesia: General

## 2022-04-30 MED ORDER — LIDOCAINE HCL (CARDIAC) PF 100 MG/5ML IV SOSY
PREFILLED_SYRINGE | INTRAVENOUS | Status: DC | PRN
  Administered 2022-04-30: 60 mg via INTRAVENOUS

## 2022-04-30 MED ORDER — PROPOFOL 10 MG/ML IV BOLUS
INTRAVENOUS | Status: DC | PRN
  Administered 2022-04-30: 100 mg via INTRAVENOUS

## 2022-04-30 MED ORDER — SODIUM CHLORIDE 0.9 % IV SOLN: INTRAVENOUS | Status: DC | PRN

## 2022-04-30 NOTE — Anesthesia Postprocedure Evaluation (Signed)
Anesthesia Post Note  Patient: Paul [name]  Procedure(s) Performed: CARDIOVERSION     Patient location during evaluation: Endoscopy Anesthesia Type: General Level of consciousness: awake and alert Pain management: pain level controlled Vital Signs Assessment: post-procedure vital signs reviewed and stable Respiratory status: spontaneous breathing, nonlabored ventilation, respiratory function stable and patient connected to nasal cannula oxygen Cardiovascular status: blood pressure returned to baseline and stable Postop Assessment: no apparent nausea or vomiting Anesthetic complications: no   No notable events documented.  Last Vitals:  Vitals:   04/30/22 1022 04/30/22 1032  BP: 95/68 95/70  Pulse: 77 76  Resp: 19 18  Temp:    SpO2: 95% 97%    Last Pain:  Vitals:   04/30/22 1032  TempSrc:   PainSc: 0-No pain                 Tannya Gonet S

## 2022-04-30 NOTE — Discharge Instructions (Signed)

## 2022-04-30 NOTE — Transfer of Care (Signed)
Immediate Anesthesia Transfer of Care Note  Patient: Paul [name]  Procedure(s) Performed: CARDIOVERSION  Patient Location: PACU  Anesthesia Type:General  Level of Consciousness: drowsy  Airway & Oxygen Therapy: Patient Spontanous Breathing  Post-op Assessment: Report given to RN and Post -op Vital signs reviewed and stable  Post vital signs: Reviewed and stable  Last Vitals:  Vitals Value Taken Time  BP    Temp    Pulse    Resp    SpO2      Last Pain:  Vitals:   04/30/22 0941  TempSrc: Oral  PainSc: 0-No pain         Complications: No notable events documented.

## 2022-04-30 NOTE — Interval H&P Note (Signed)
History and Physical Interval Note:  04/30/2022 9:38 AM  Paul [name]  has presented today for surgery, with the diagnosis of AFIB.  The various methods of treatment have been discussed with the patient and family. After consideration of risks, benefits and other options for treatment, the patient has consented to  Procedure(s): CARDIOVERSION (N/A) as a surgical intervention.  The patient's history has been reviewed, patient examined, no change in status, stable for surgery.  I have reviewed the patient's chart and labs.  Questions were answered to the patient's satisfaction.     Kristeen Miss

## 2022-04-30 NOTE — Interval H&P Note (Signed)
History and Physical Interval Note:  04/30/2022 9:39 AM  Paul [name]  has presented today for surgery, with the diagnosis of AFIB.  The various methods of treatment have been discussed with the patient and family. After consideration of risks, benefits and other options for treatment, the patient has consented to  Procedure(s): CARDIOVERSION (N/A) as a surgical intervention.  The patient's history has been reviewed, patient examined, no change in status, stable for surgery.  I have reviewed the patient's chart and labs.  Questions were answered to the patient's satisfaction.     Kristeen Miss

## 2022-04-30 NOTE — Anesthesia Preprocedure Evaluation (Signed)
Anesthesia Evaluation  Patient identified by MRN, date of birth, ID band Patient awake    Reviewed: Allergy & Precautions, H&P , NPO status , Patient's Chart, lab work & pertinent test results  Airway Mallampati: II   Neck ROM: full    Dental   Pulmonary shortness of breath,    breath sounds clear to auscultation       Cardiovascular hypertension, + CAD and +CHF  + dysrhythmias Atrial Fibrillation  Rhythm:irregular Rate:Normal     Neuro/Psych    GI/Hepatic   Endo/Other    Renal/GU Renal InsufficiencyRenal disease     Musculoskeletal   Abdominal   Peds  Hematology   Anesthesia Other Findings   Reproductive/Obstetrics                             Anesthesia Physical Anesthesia Plan  ASA: 3  Anesthesia Plan: General   Post-op Pain Management:    Induction: Intravenous  PONV Risk Score and Plan: Propofol infusion and Treatment may vary due to age or medical condition  Airway Management Planned: Mask  Additional Equipment:   Intra-op Plan:   Post-operative Plan:   Informed Consent: I have reviewed the patients History and Physical, chart, labs and discussed the procedure including the risks, benefits and alternatives for the proposed anesthesia with the patient or authorized representative who has indicated his/her understanding and acceptance.     Dental advisory given  Plan Discussed with: CRNA, Anesthesiologist and Surgeon  Anesthesia Plan Comments:         Anesthesia Quick Evaluation

## 2022-04-30 NOTE — CV Procedure (Signed)
    Cardioversion Note  Paul [name] [id number] 12-17-1960  Procedure: DC Cardioversion Indications: Atrial flutter   Procedure Details Consent: Obtained Time Out: Verified patient identification, verified procedure, site/side was marked, verified correct patient position, special equipment/implants available, Radiology Safety Procedures followed,  medications/allergies/relevent history reviewed, required imaging and test results available.  Performed  The patient has been on adequate anticoagulation.  The patient received IV Lidocaine 60 mg IV followed by Propofol 100 mg IV  for sedation.  Synchronous cardioversion was performed at 200  joules.  The cardioversion was successful.     Complications: No apparent complications Patient did tolerate procedure well.   Vesta Mixer, Montez Hageman., MD, [facility] 04/30/2022, 10:11 AM

## 2022-05-03 ENCOUNTER — Encounter (HOSPITAL_COMMUNITY): Payer: Self-pay | Admitting: Cardiovascular Disease

## 2022-05-13 ENCOUNTER — Encounter (HOSPITAL_COMMUNITY): Payer: Self-pay | Admitting: Physician Assistant

## 2022-05-13 ENCOUNTER — Ambulatory Visit (HOSPITAL_COMMUNITY)
Admission: RE | Admit: 2022-05-13 | Discharge: 2022-05-13 | Disposition: A | Discharge status: Home / Self Care | Payer: Medicaid Other | Source: Ambulatory Visit | Attending: Physician Assistant | Admitting: Physician Assistant

## 2022-05-13 ENCOUNTER — Other Ambulatory Visit (HOSPITAL_COMMUNITY): Payer: Self-pay | Admitting: Internal Medicine

## 2022-05-13 VITALS — BP 114/74 | HR 70 | Ht 75.0 in | Wt 285.4 lb

## 2022-05-13 DIAGNOSIS — G4733 Obstructive sleep apnea (adult) (pediatric): Secondary | ICD-10-CM | POA: Insufficient documentation

## 2022-05-13 DIAGNOSIS — I5022 Chronic systolic (congestive) heart failure: Secondary | ICD-10-CM | POA: Diagnosis not present

## 2022-05-13 DIAGNOSIS — I110 Hypertensive heart disease with heart failure: Secondary | ICD-10-CM | POA: Diagnosis not present

## 2022-05-13 DIAGNOSIS — D6869 Other thrombophilia: Secondary | ICD-10-CM

## 2022-05-13 DIAGNOSIS — I2510 Atherosclerotic heart disease of native coronary artery without angina pectoris: Secondary | ICD-10-CM | POA: Insufficient documentation

## 2022-05-13 DIAGNOSIS — I4892 Unspecified atrial flutter: Secondary | ICD-10-CM | POA: Insufficient documentation

## 2022-05-13 DIAGNOSIS — E669 Obesity, unspecified: Secondary | ICD-10-CM | POA: Diagnosis not present

## 2022-05-13 DIAGNOSIS — Z6835 Body mass index (BMI) 35.0-35.9, adult: Secondary | ICD-10-CM | POA: Insufficient documentation

## 2022-05-13 NOTE — Progress Notes (Signed)
Primary Care Physician: Steffanie Rainwater, MD Primary Cardiologist: Dr Gala Romney Primary Electrophysiologist: none Referring Physician: Dr Debbrah Alar Cheever is a 61 y.o. male with a history of chronic systolic CHF, CAD, HTN, OSA, atrial flutter who presents for follow up in the [facility]. The patient was initially diagnosed with atrial flutter after presenting to his PCP with symptoms of lightheadedness and palpitations over the previous 2-3 weeks. ECG showed atrial flutter with 2:1 block. His carvedilol was increased and he was started on Eliquis for a CHADS2VASC score of 3. Patient is s/p DCCV 01/08/22. Patient was seen at PCP office 04/21/22 and was found to be back in atrial flutter. He has had palpitations for 2-3 weeks prior, fatigue, and intermittent lightheadedness. He went back into atrial flutter after a heated argument with a family member.   On follow up today, patient is s/p DCCV on 04/30/22. Patient reports that he feels "years younger" today. He has been much more active and his symptoms of fatigue and dizziness have resolved. No bleeding issues on anticoagulation.   Today, he denies symptoms of palpitations, chest pain, shortness of breath, orthopnea, PND, lower extremity edema, presyncope, syncope, bleeding, or neurologic sequela. The patient is tolerating medications without difficulties and is otherwise without complaint today.    Atrial Fibrillation Risk Factors:  he does have symptoms or diagnosis of sleep apnea. he is not compliant with CPAP therapy. he does not have a history of rheumatic fever. he does have a history of alcohol use.   he has a BMI of Body mass index is 35.67 kg/m.Marland Kitchen Filed Weights   05/13/22 1354  Weight: 129.5 kg     Family History  Problem Relation Age of Onset   Pulmonary Hypertension Mother    Hypertension Mother    CAD Father        s/p CABG x 4   Heart failure Father    Heart failure Sister     Hypertension Sister      Atrial Fibrillation Management history:  Previous antiarrhythmic drugs: none Previous cardioversions: 01/08/22, 04/30/22 Previous ablations: none CHADS2VASC score: 3 Anticoagulation history: Eliquis   Past Medical History:  Diagnosis Date   CHF (congestive heart failure) (HCC)    Dyspnea    History of hip replacement    Hypertension    Past Surgical History:  Procedure Laterality Date   CARDIOVERSION N/A 01/08/2022   Procedure: CARDIOVERSION;  Surgeon: Jodelle Red, MD;  Location: [facility] ENDOSCOPY;  Service: Cardiovascular;  Laterality: N/A;   CARDIOVERSION N/A 04/30/2022   Procedure: CARDIOVERSION;  Surgeon: Vesta Mixer, MD;  Location: [facility] ENDOSCOPY;  Service: Cardiovascular;  Laterality: N/A;   RIGHT/LEFT HEART CATH AND CORONARY ANGIOGRAPHY N/A 10/17/2020   Procedure: RIGHT/LEFT HEART CATH AND CORONARY ANGIOGRAPHY;  Surgeon: Dolores Patty, MD;  Location: [facility] INVASIVE CV LAB;  Service: Cardiovascular;  Laterality: N/A;   TOTAL HIP ARTHROPLASTY      Current Outpatient Medications  Medication Sig Dispense Refill   apixaban (ELIQUIS) 5 MG TABS tablet Take 1 tablet (5 mg total) by mouth 2 (two) times daily. 60 tablet 5   Ascorbic Acid (VITAMIN C) 1000 MG tablet Take 1,000 mg by mouth daily.     atorvastatin (LIPITOR) 40 MG tablet Take 1 tablet (40 mg total) by mouth daily. 90 tablet 3   carvedilol (COREG) 25 MG tablet Take 1 tablet (25 mg total) by mouth 2 (two) times daily. 60 tablet 11   diltiazem (CARDIZEM  CD) 180 MG 24 hr capsule Take 1 capsule (180 mg total) by mouth daily. 30 capsule 5   folic acid (FOLVITE) 1 MG tablet TAKE 1 TABLET BY MOUTH EVERY DAY 90 tablet 1   furosemide (LASIX) 40 MG tablet Take 1 tablet (40 mg total) by mouth 2 (two) times daily. Take furosemide 40 mg daily. Can take extra 40 mg as needed for additional swelling or weight gain. (Patient taking differently: Take 80 mg by mouth daily as needed for edema.) 180 tablet 3    ibuprofen (ADVIL) 200 MG tablet Take 400-600 mg by mouth every 8 (eight) hours as needed for moderate pain.     sacubitril-valsartan (ENTRESTO) 97-103 MG Take 1 tablet by mouth 2 (two) times daily. 60 tablet 11   spironolactone (ALDACTONE) 25 MG tablet TAKE 1 TABLET (25 MG TOTAL) BY MOUTH DAILY. 90 tablet 3   thiamine 100 MG tablet TAKE 1 TABLET BY MOUTH EVERY DAY 90 tablet 3   No current facility-administered medications for this encounter.    Allergies  Allergen Reactions   Shellfish Allergy Anaphylaxis, Shortness Of Breath and Swelling    Throat swelling    Social History   Socioeconomic History   Marital status: Divorced    Spouse name: Not on file   Number of children: Not on file   Years of education: Not on file   Highest education level: Not on file  Occupational History   Not on file  Tobacco Use   Smoking status: Never   Smokeless tobacco: Former    Types: Chew   Tobacco comments:    Former chew/dip 12/23/21  Vaping Use   Vaping Use: Never used  Substance and Sexual Activity   Alcohol use: Yes    Alcohol/week: 4.0 - 8.0 standard drinks of alcohol    Types: 4 - 8 Cans of beer per week    Comment: 1-2 beers every other day 12/23/21   Drug use: Not Currently   Sexual activity: Not on file  Other Topics Concern   Not on file  Social History Narrative   Not on file   Social Determinants of Health   Financial Resource Strain: Not on file  Food Insecurity: Not on file  Transportation Needs: Not on file  Physical Activity: Not on file  Stress: Not on file  Social Connections: Not on file  Intimate Partner Violence: Not on file     ROS- All systems are reviewed and negative except as per the HPI above.  Physical Exam: Vitals:   05/13/22 1354  BP: 114/74  Pulse: 70  Weight: 129.5 kg  Height: 6\' 3"  (1.905 m)     GEN- The patient is a well appearing obese male, alert and oriented x 3 today.   HEENT-head normocephalic, atraumatic, sclera clear,  conjunctiva pink, hearing intact, trachea midline. Lungs- Clear to ausculation bilaterally, normal work of breathing Heart- Regular rate and rhythm, no murmurs, rubs or gallops  GI- soft, NT, ND, + BS Extremities- no clubbing, cyanosis, or edema MS- no significant deformity or atrophy Skin- no rash or lesion Psych- euthymic mood, full affect Neuro- strength and sensation are intact   Wt Readings from Last 3 Encounters:  05/13/22 129.5 kg  04/30/22 130.5 kg  04/22/22 130.5 kg    EKG today demonstrates  SR Vent. rate 70 BPM PR interval 178 ms QRS duration 96 ms QT/QTcB 424/457 ms  Echo 09/16/21 demonstrated  1. Left ventricular ejection fraction, by estimation, is 60 to 65%. The  left ventricle has normal function. The left ventricle has no regional  wall motion abnormalities. There is moderate left ventricular hypertrophy. Left ventricular diastolic parameters are consistent with Grade I diastolic dysfunction (impaired relaxation).   2. Right ventricular systolic function is normal. The right ventricular  size is normal. There is normal pulmonary artery systolic pressure.   3. Left atrial size was severely dilated.   4. The mitral valve is normal in structure. Trivial mitral valve  regurgitation. No evidence of mitral stenosis.   5. The aortic valve is tricuspid. There is mild calcification of the  aortic valve. Aortic valve regurgitation is not visualized. No aortic  stenosis is present.   6. The inferior vena cava is normal in size with greater than 50%  respiratory variability, suggesting right atrial pressure of 3 mmHg.   Epic records are reviewed at length today  CHA2DS2-VASc Score = 3  The patient's score is based upon: CHF History: 1 HTN History: 1 Diabetes History: 0 Stroke History: 0 Vascular Disease History: 1 Age Score: 0 Gender Score: 0       ASSESSMENT AND PLAN: 1. Atrial flutter The patient's CHA2DS2-VASc score is 3, indicating a 3.2% annual risk of  stroke.   S/p DCCV 04/30/22 Patient in SR today. It is likely that he will have recurrent arrhythmias with untreated OSA and severely dilated LA. Would avoid class IC with h/o CAD. Dofetilide admission would likely be the next step for rhythm control, discussed again today. Continue carvedilol 25 mg BID Continue diltiazem 180 mg daily Continue Eliquis 5 mg BID  2. Secondary Hypercoagulable State (ICD10:  D68.69) The patient is at significant risk for stroke/thromboembolism based upon his CHA2DS2-VASc Score of 3.  Continue Apixaban (Eliquis).   3. Obesity Body mass index is 35.67 kg/m. Lifestyle modification was discussed and encouraged including regular physical activity and weight reduction.  4. Obstructive sleep apnea Patient intolerant of CPAP. Considering Inspire.   5. Chronic systolic CHF EF recovered to 55-60% Appears euvolemic today.  6. CAD No anginal symptoms.  7. HTN Stable, no changes today.   Follow up with Dr Gala Romney per recall. AF clinic in 6 months.    Jorja Loa PA-C [facility] [address] [place], [place] [address] [phone] 05/13/2022 1:58 PM

## 2022-08-13 ENCOUNTER — Ambulatory Visit (HOSPITAL_COMMUNITY): Payer: Medicaid Other | Admitting: Physician Assistant

## 2022-08-18 ENCOUNTER — Other Ambulatory Visit (HOSPITAL_COMMUNITY): Payer: Self-pay | Admitting: Internal Medicine

## 2022-08-18 DIAGNOSIS — I5022 Chronic systolic (congestive) heart failure: Secondary | ICD-10-CM

## 2022-09-08 ENCOUNTER — Ambulatory Visit (HOSPITAL_COMMUNITY)
Admission: RE | Admit: 2022-09-08 | Discharge: 2022-09-08 | Disposition: A | Discharge status: Home / Self Care | Payer: Medicaid Other | Source: Ambulatory Visit | Attending: Physician Assistant | Admitting: Physician Assistant

## 2022-09-08 ENCOUNTER — Encounter (HOSPITAL_COMMUNITY): Payer: Self-pay | Admitting: Physician Assistant

## 2022-09-08 VITALS — BP 128/80 | HR 126 | Ht 75.0 in | Wt 282.6 lb

## 2022-09-08 DIAGNOSIS — R0681 Apnea, not elsewhere classified: Secondary | ICD-10-CM

## 2022-09-08 DIAGNOSIS — I5022 Chronic systolic (congestive) heart failure: Secondary | ICD-10-CM | POA: Diagnosis not present

## 2022-09-08 DIAGNOSIS — I4892 Unspecified atrial flutter: Secondary | ICD-10-CM | POA: Insufficient documentation

## 2022-09-08 DIAGNOSIS — D6869 Other thrombophilia: Secondary | ICD-10-CM | POA: Insufficient documentation

## 2022-09-08 DIAGNOSIS — I110 Hypertensive heart disease with heart failure: Secondary | ICD-10-CM | POA: Insufficient documentation

## 2022-09-08 DIAGNOSIS — E669 Obesity, unspecified: Secondary | ICD-10-CM | POA: Diagnosis not present

## 2022-09-08 DIAGNOSIS — Z6835 Body mass index (BMI) 35.0-35.9, adult: Secondary | ICD-10-CM | POA: Diagnosis not present

## 2022-09-08 DIAGNOSIS — I2510 Atherosclerotic heart disease of native coronary artery without angina pectoris: Secondary | ICD-10-CM | POA: Insufficient documentation

## 2022-09-08 DIAGNOSIS — G4733 Obstructive sleep apnea (adult) (pediatric): Secondary | ICD-10-CM | POA: Insufficient documentation

## 2022-09-08 DIAGNOSIS — R0683 Snoring: Secondary | ICD-10-CM | POA: Diagnosis not present

## 2022-09-08 LAB — CBC
HCT: 42.7 % (ref 39.0–52.0)
Hemoglobin: 13.6 g/dL (ref 13.0–17.0)
MCH: 27.2 pg (ref 26.0–34.0)
MCHC: 31.9 g/dL (ref 30.0–36.0)
MCV: 85.4 fL (ref 80.0–100.0)
Platelets: 261 10*3/uL (ref 150–400)
RBC: 5 MIL/uL (ref 4.22–5.81)
RDW: 17.8 % — ABNORMAL HIGH (ref 11.5–15.5)
WBC: 4.4 10*3/uL (ref 4.0–10.5)
nRBC: 0 % (ref 0.0–0.2)

## 2022-09-08 LAB — BASIC METABOLIC PANEL
Anion gap: 11 (ref 5–15)
BUN: 31 mg/dL — ABNORMAL HIGH (ref 8–23)
CO2: 23 mmol/L (ref 22–32)
Calcium: 8.9 mg/dL (ref 8.9–10.3)
Chloride: 100 mmol/L (ref 98–111)
Creatinine, Ser: 1.6 mg/dL — ABNORMAL HIGH (ref 0.61–1.24)
GFR, Estimated: 48 mL/min — ABNORMAL LOW (ref >60)
Glucose, Bld: 104 mg/dL — ABNORMAL HIGH (ref 70–99)
Potassium: 4.5 mmol/L (ref 3.5–5.1)
Sodium: 134 mmol/L — ABNORMAL LOW (ref 135–145)

## 2022-09-08 NOTE — Patient Instructions (Signed)
Cardioversion scheduled for Tuesday October 31  - Arrive at the [facility] and go to admitting at 7:30am  - Do not eat or drink anything after midnight the night prior to your procedure.  - Take all your morning medication (HOLD [place]) with a sip of water prior to arrival.  - You will not be able to drive home after your procedure.  - Do NOT miss any doses of your blood thinner - if you should miss a dose please notify our office immediately.  - If you feel as if you go back into normal rhythm prior to scheduled cardioversion, please notify our office immediately. If your procedure is canceled in the cardioversion suite you will be charged a cancellation fee.

## 2022-09-08 NOTE — Progress Notes (Signed)
Primary Care Physician: Lacinda Axon, MD Primary Cardiologist: Dr Haroldine Laws Primary Electrophysiologist: none Referring Physician: Dr Festus Barren [name] is a 61 y.o. male with a history of chronic systolic CHF, CAD, HTN, OSA, atrial flutter who presents for follow up in the [facility]. The patient was initially diagnosed with atrial flutter after presenting to his PCP with symptoms of lightheadedness and palpitations over the previous 2-3 weeks. ECG showed atrial flutter with 2:1 block. His carvedilol was increased and he was started on Eliquis for a CHADS2VASC score of 3. Patient is s/p DCCV 01/08/22. Patient was seen at PCP office 04/21/22 and was found to be back in atrial flutter. He has had palpitations for 2-3 weeks prior, fatigue, and intermittent lightheadedness. He went back into atrial flutter after a heated argument with a family member. Patient is s/p DCCV on 04/30/22.   On follow up today, patient reports that he has intermittent dizziness but otherwise feels well today. He is in atrial flutter with rapid rates but mostly unaware of his arrhythmia. There were no specific triggers that he could identity.   Today, he denies symptoms of palpitations, chest pain, shortness of breath, orthopnea, PND, lower extremity edema, presyncope, syncope, bleeding, or neurologic sequela. The patient is tolerating medications without difficulties and is otherwise without complaint today.    Atrial Fibrillation Risk Factors:  he does have symptoms or diagnosis of sleep apnea. he is not compliant with CPAP therapy. he does not have a history of rheumatic fever. he does have a history of alcohol use.   he has a BMI of Body mass index is 35.32 kg/m.Marland Kitchen Filed Weights   09/08/22 0938  Weight: 128.2 kg    Family History  Problem Relation Age of Onset   Pulmonary Hypertension Mother    Hypertension Mother    CAD Father        s/p CABG x 4   Heart  failure Father    Heart failure Sister    Hypertension Sister      Atrial Fibrillation Management history:  Previous antiarrhythmic drugs: none Previous cardioversions: 01/08/22, 04/30/22 Previous ablations: none CHADS2VASC score: 3 Anticoagulation history: Eliquis   Past Medical History:  Diagnosis Date   CHF (congestive heart failure) (HCC)    Dyspnea    History of hip replacement    Hypertension    Past Surgical History:  Procedure Laterality Date   CARDIOVERSION N/A 01/08/2022   Procedure: CARDIOVERSION;  Surgeon: Buford Dresser, MD;  Location: [facility] ENDOSCOPY;  Service: Cardiovascular;  Laterality: N/A;   CARDIOVERSION N/A 04/30/2022   Procedure: CARDIOVERSION;  Surgeon: Thayer Headings, MD;  Location: [facility] ENDOSCOPY;  Service: Cardiovascular;  Laterality: N/A;   RIGHT/LEFT HEART CATH AND CORONARY ANGIOGRAPHY N/A 10/17/2020   Procedure: RIGHT/LEFT HEART CATH AND CORONARY ANGIOGRAPHY;  Surgeon: Jolaine Artist, MD;  Location: [place] CV LAB;  Service: Cardiovascular;  Laterality: N/A;   TOTAL HIP ARTHROPLASTY      Current Outpatient Medications  Medication Sig Dispense Refill   apixaban (ELIQUIS) 5 MG TABS tablet Take 1 tablet (5 mg total) by mouth 2 (two) times daily. 60 tablet 5   Ascorbic Acid (VITAMIN C) 1000 MG tablet Take 1,000 mg by mouth daily.     atorvastatin (LIPITOR) 40 MG tablet Take 1 tablet (40 mg total) by mouth daily. 90 tablet 3   carvedilol (COREG) 25 MG tablet Take 1 tablet (25 mg total) by mouth 2 (two) times daily. [place]  tablet 11   diltiazem (CARDIZEM CD) 180 MG 24 hr capsule Take 1 capsule (180 mg total) by mouth daily. 30 capsule 5   FARXIGA 10 MG TABS tablet TAKE 1 TABLET BY MOUTH DAILY BEFORE BREAKFAST. 30 tablet 11   folic acid (FOLVITE) 1 MG tablet TAKE 1 TABLET BY MOUTH EVERY DAY 90 tablet 1   furosemide (LASIX) 40 MG tablet Take 1 tablet (40 mg total) by mouth daily. NEEDS FOLLOW UP APPOINTMENT FOR MORE REFILLS 60 tablet 4   ibuprofen  (ADVIL) 200 MG tablet Take 400-600 mg by mouth every 8 (eight) hours as needed for moderate pain.     sacubitril-valsartan (ENTRESTO) 97-103 MG Take 1 tablet by mouth 2 (two) times daily. 60 tablet 11   spironolactone (ALDACTONE) 25 MG tablet TAKE 1 TABLET (25 MG TOTAL) BY MOUTH DAILY. 90 tablet 3   thiamine 100 MG tablet TAKE 1 TABLET BY MOUTH EVERY DAY 90 tablet 3   No current facility-administered medications for this encounter.    Allergies  Allergen Reactions   Shellfish Allergy Anaphylaxis, Shortness Of Breath and Swelling    Throat swelling    Social History   Socioeconomic History   Marital status: Divorced    Spouse name: Not on file   Number of children: Not on file   Years of education: Not on file   Highest education level: Not on file  Occupational History   Not on file  Tobacco Use   Smoking status: Never   Smokeless tobacco: Former    Types: Chew   Tobacco comments:    Former chew/dip 12/23/21  Vaping Use   Vaping Use: Never used  Substance and Sexual Activity   Alcohol use: Yes    Alcohol/week: 4.0 - 8.0 standard drinks of alcohol    Types: 4 - 8 Cans of beer per week    Comment: 1-2 beers every other day 12/23/21   Drug use: Not Currently   Sexual activity: Not on file  Other Topics Concern   Not on file  Social History Narrative   Not on file   Social Determinants of Health   Financial Resource Strain: Not on file  Food Insecurity: Not on file  Transportation Needs: Not on file  Physical Activity: Not on file  Stress: Not on file  Social Connections: Not on file  Intimate Partner Violence: Not on file     ROS- All systems are reviewed and negative except as per the HPI above.  Physical Exam: Vitals:   09/08/22 0938  BP: 128/80  Pulse: (!) 126  Weight: 128.2 kg  Height: 6\' 3"  (1.905 m)    GEN- The patient is a well appearing male, alert and oriented x 3 today.   HEENT-head normocephalic, atraumatic, sclera clear, conjunctiva pink,  hearing intact, trachea midline. Lungs- Clear to ausculation bilaterally, normal work of breathing Heart- Regular rate and rhythm, tachycardia, no murmurs, rubs or gallops  GI- soft, NT, ND, + BS Extremities- no clubbing, cyanosis, or edema MS- no significant deformity or atrophy Skin- no rash or lesion Psych- euthymic mood, full affect Neuro- strength and sensation are intact   Wt Readings from Last 3 Encounters:  09/08/22 128.2 kg  05/13/22 129.5 kg  04/30/22 130.5 kg    EKG today demonstrates  Atrial flutter with 2:1 block Vent. rate 126 BPM PR interval * ms QRS duration 90 ms QT/QTcB 182/263 ms  Echo 09/16/21 demonstrated  1. Left ventricular ejection fraction, by estimation, is 60 to 65%.  The  left ventricle has normal function. The left ventricle has no regional  wall motion abnormalities. There is moderate left ventricular hypertrophy. Left ventricular diastolic parameters are consistent with Grade I diastolic dysfunction (impaired relaxation).   2. Right ventricular systolic function is normal. The right ventricular  size is normal. There is normal pulmonary artery systolic pressure.   3. Left atrial size was severely dilated.   4. The mitral valve is normal in structure. Trivial mitral valve  regurgitation. No evidence of mitral stenosis.   5. The aortic valve is tricuspid. There is mild calcification of the  aortic valve. Aortic valve regurgitation is not visualized. No aortic  stenosis is present.   6. The inferior vena cava is normal in size with greater than 50%  respiratory variability, suggesting right atrial pressure of 3 mmHg.   Epic records are reviewed at length today  CHA2DS2-VASc Score = 3  The patient's score is based upon: CHF History: 1 HTN History: 1 Diabetes History: 0 Stroke History: 0 Vascular Disease History: 1 Age Score: 0 Gender Score: 0       ASSESSMENT AND PLAN: 1. Atrial flutter The patient's CHA2DS2-VASc score is 3, indicating a  3.2% annual risk of stroke.   Patient back in rapid atrial flutter. We discussed rhythm control options today. Would avoid class IC with h/o CAD. We discussed dofetilide admission vs ablation. Will refer to EP to discuss options. He does have a severely dilated LA. He is agreeable to dofetilide admission if not felt to be a candidate for ablation.  Short term, will arrange for DCCV. Check bmet/cbc. Continue carvedilol 25 mg BID Continue diltiazem 180 mg daily Continue Eliquis 5 mg BID  2. Secondary Hypercoagulable State (ICD10:  D68.69) The patient is at significant risk for stroke/thromboembolism based upon his CHA2DS2-VASc Score of 3.  Continue Apixaban (Eliquis).   3. Obesity Body mass index is 35.32 kg/m. Lifestyle modification was discussed and encouraged including regular physical activity and weight reduction.  4. Suspected obstructive sleep apnea Patient reports today that he has never been formally diagnosed with OSA and has never had a sleep study. He self reported that he had OSA.  Will refer for sleep study.   5. Chronic systolic CHF EF recovered to 55-60% Fluid status appears stable.  6. CAD No anginal symptoms.  7. HTN Stable, no changes today.   Follow up with EP to establish care and discuss dofetilide vs ablation post DCCV.    [facility] [address] [place], [place] [address] [phone] 09/08/2022 9:53 AM

## 2022-09-08 NOTE — H&P (View-Only) (Signed)
Primary Care Physician: Lacinda Axon, MD Primary Cardiologist: Dr Haroldine Laws Primary Electrophysiologist: none Referring Physician: Dr Paul [name] is a 61 y.o. male with a history of chronic systolic CHF, CAD, HTN, OSA, atrial flutter who presents for follow up in the [facility]. The patient was initially diagnosed with atrial flutter after presenting to his PCP with symptoms of lightheadedness and palpitations over the previous 2-3 weeks. ECG showed atrial flutter with 2:1 block. His carvedilol was increased and he was started on Eliquis for a CHADS2VASC score of 3. Patient is s/p DCCV 01/08/22. Patient was seen at PCP office 04/21/22 and was found to be back in atrial flutter. He has had palpitations for 2-3 weeks prior, fatigue, and intermittent lightheadedness. He went back into atrial flutter after a heated argument with a family member. Patient is s/p DCCV on 04/30/22.   On follow up today, patient reports that he has intermittent dizziness but otherwise feels well today. He is in atrial flutter with rapid rates but mostly unaware of his arrhythmia. There were no specific triggers that he could identity.   Today, he denies symptoms of palpitations, chest pain, shortness of breath, orthopnea, PND, lower extremity edema, presyncope, syncope, bleeding, or neurologic sequela. The patient is tolerating medications without difficulties and is otherwise without complaint today.    Atrial Fibrillation Risk Factors:  he does have symptoms or diagnosis of sleep apnea. he is not compliant with CPAP therapy. he does not have a history of rheumatic fever. he does have a history of alcohol use.   he has a BMI of Body mass index is 35.32 kg/m.Marland Kitchen Filed Weights   09/08/22 0938  Weight: 128.2 kg    Family History  Problem Relation Age of Onset   Pulmonary Hypertension Mother    Hypertension Mother    CAD Father        s/p CABG x 4   Heart  failure Father    Heart failure Sister    Hypertension Sister      Atrial Fibrillation Management history:  Previous antiarrhythmic drugs: none Previous cardioversions: 01/08/22, 04/30/22 Previous ablations: none CHADS2VASC score: 3 Anticoagulation history: Eliquis   Past Medical History:  Diagnosis Date   CHF (congestive heart failure) (HCC)    Dyspnea    History of hip replacement    Hypertension    Past Surgical History:  Procedure Laterality Date   CARDIOVERSION N/A 01/08/2022   Procedure: CARDIOVERSION;  Surgeon: Buford Dresser, MD;  Location: [facility] ENDOSCOPY;  Service: Cardiovascular;  Laterality: N/A;   CARDIOVERSION N/A 04/30/2022   Procedure: CARDIOVERSION;  Surgeon: Thayer Headings, MD;  Location: [facility] ENDOSCOPY;  Service: Cardiovascular;  Laterality: N/A;   RIGHT/LEFT HEART CATH AND CORONARY ANGIOGRAPHY N/A 10/17/2020   Procedure: RIGHT/LEFT HEART CATH AND CORONARY ANGIOGRAPHY;  Surgeon: Jolaine Artist, MD;  Location: [facility] CV LAB;  Service: Cardiovascular;  Laterality: N/A;   TOTAL HIP ARTHROPLASTY      Current Outpatient Medications  Medication Sig Dispense Refill   apixaban (ELIQUIS) 5 MG TABS tablet Take 1 tablet (5 mg total) by mouth 2 (two) times daily. 60 tablet 5   Ascorbic Acid (VITAMIN C) 1000 MG tablet Take 1,000 mg by mouth daily.     atorvastatin (LIPITOR) 40 MG tablet Take 1 tablet (40 mg total) by mouth daily. 90 tablet 3   carvedilol (COREG) 25 MG tablet Take 1 tablet (25 mg total) by mouth 2 (two) times daily. [place]  tablet 11   diltiazem (CARDIZEM CD) 180 MG 24 hr capsule Take 1 capsule (180 mg total) by mouth daily. 30 capsule 5   FARXIGA 10 MG TABS tablet TAKE 1 TABLET BY MOUTH DAILY BEFORE BREAKFAST. 30 tablet 11   folic acid (FOLVITE) 1 MG tablet TAKE 1 TABLET BY MOUTH EVERY DAY 90 tablet 1   furosemide (LASIX) 40 MG tablet Take 1 tablet (40 mg total) by mouth daily. NEEDS FOLLOW UP APPOINTMENT FOR MORE REFILLS 60 tablet 4   ibuprofen  (ADVIL) 200 MG tablet Take 400-600 mg by mouth every 8 (eight) hours as needed for moderate pain.     sacubitril-valsartan (ENTRESTO) 97-103 MG Take 1 tablet by mouth 2 (two) times daily. 60 tablet 11   spironolactone (ALDACTONE) 25 MG tablet TAKE 1 TABLET (25 MG TOTAL) BY MOUTH DAILY. 90 tablet 3   thiamine 100 MG tablet TAKE 1 TABLET BY MOUTH EVERY DAY 90 tablet 3   No current facility-administered medications for this encounter.    Allergies  Allergen Reactions   Shellfish Allergy Anaphylaxis, Shortness Of Breath and Swelling    Throat swelling    Social History   Socioeconomic History   Marital status: Divorced    Spouse name: Not on file   Number of children: Not on file   Years of education: Not on file   Highest education level: Not on file  Occupational History   Not on file  Tobacco Use   Smoking status: Never   Smokeless tobacco: Former    Types: Chew   Tobacco comments:    Former chew/dip 12/23/21  Vaping Use   Vaping Use: Never used  Substance and Sexual Activity   Alcohol use: Yes    Alcohol/week: 4.0 - 8.0 standard drinks of alcohol    Types: 4 - 8 Cans of beer per week    Comment: 1-2 beers every other day 12/23/21   Drug use: Not Currently   Sexual activity: Not on file  Other Topics Concern   Not on file  Social History Narrative   Not on file   Social Determinants of Health   Financial Resource Strain: Not on file  Food Insecurity: Not on file  Transportation Needs: Not on file  Physical Activity: Not on file  Stress: Not on file  Social Connections: Not on file  Intimate Partner Violence: Not on file     ROS- All systems are reviewed and negative except as per the HPI above.  Physical Exam: Vitals:   09/08/22 0938  BP: 128/80  Pulse: (!) 126  Weight: 128.2 kg  Height: 6\' 3"  (1.905 m)    GEN- The patient is a well appearing male, alert and oriented x 3 today.   HEENT-head normocephalic, atraumatic, sclera clear, conjunctiva pink,  hearing intact, trachea midline. Lungs- Clear to ausculation bilaterally, normal work of breathing Heart- Regular rate and rhythm, tachycardia, no murmurs, rubs or gallops  GI- soft, NT, ND, + BS Extremities- no clubbing, cyanosis, or edema MS- no significant deformity or atrophy Skin- no rash or lesion Psych- euthymic mood, full affect Neuro- strength and sensation are intact   Wt Readings from Last 3 Encounters:  09/08/22 128.2 kg  05/13/22 129.5 kg  04/30/22 130.5 kg    EKG today demonstrates  Atrial flutter with 2:1 block Vent. rate 126 BPM PR interval * ms QRS duration 90 ms QT/QTcB 182/263 ms  Echo 09/16/21 demonstrated  1. Left ventricular ejection fraction, by estimation, is 60 to 65%.  The  left ventricle has normal function. The left ventricle has no regional  wall motion abnormalities. There is moderate left ventricular hypertrophy. Left ventricular diastolic parameters are consistent with Grade I diastolic dysfunction (impaired relaxation).   2. Right ventricular systolic function is normal. The right ventricular  size is normal. There is normal pulmonary artery systolic pressure.   3. Left atrial size was severely dilated.   4. The mitral valve is normal in structure. Trivial mitral valve  regurgitation. No evidence of mitral stenosis.   5. The aortic valve is tricuspid. There is mild calcification of the  aortic valve. Aortic valve regurgitation is not visualized. No aortic  stenosis is present.   6. The inferior vena cava is normal in size with greater than 50%  respiratory variability, suggesting right atrial pressure of 3 mmHg.   Epic records are reviewed at length today  CHA2DS2-VASc Score = 3  The patient's score is based upon: CHF History: 1 HTN History: 1 Diabetes History: 0 Stroke History: 0 Vascular Disease History: 1 Age Score: 0 Gender Score: 0       ASSESSMENT AND PLAN: 1. Atrial flutter The patient's CHA2DS2-VASc score is 3, indicating a  3.2% annual risk of stroke.   Patient back in rapid atrial flutter. We discussed rhythm control options today. Would avoid class IC with h/o CAD. We discussed dofetilide admission vs ablation. Will refer to EP to discuss options. He does have a severely dilated LA. He is agreeable to dofetilide admission if not felt to be a candidate for ablation.  Short term, will arrange for DCCV. Check bmet/cbc. Continue carvedilol 25 mg BID Continue diltiazem 180 mg daily Continue Eliquis 5 mg BID  2. Secondary Hypercoagulable State (ICD10:  D68.69) The patient is at significant risk for stroke/thromboembolism based upon his CHA2DS2-VASc Score of 3.  Continue Apixaban (Eliquis).   3. Obesity Body mass index is 35.32 kg/m. Lifestyle modification was discussed and encouraged including regular physical activity and weight reduction.  4. Suspected obstructive sleep apnea Patient reports today that he has never been formally diagnosed with OSA and has never had a sleep study. He self reported that he had OSA.  Will refer for sleep study.   5. Chronic systolic CHF EF recovered to 55-60% Fluid status appears stable.  6. CAD No anginal symptoms.  7. HTN Stable, no changes today.   Follow up with EP to establish care and discuss dofetilide vs ablation post DCCV.    [facility] [address] [place], [place] [address] [phone] 09/08/2022 9:53 AM

## 2022-09-09 ENCOUNTER — Other Ambulatory Visit (HOSPITAL_COMMUNITY): Payer: Self-pay | Admitting: Physician Assistant

## 2022-09-09 DIAGNOSIS — I4892 Unspecified atrial flutter: Secondary | ICD-10-CM

## 2022-09-15 ENCOUNTER — Encounter (HOSPITAL_COMMUNITY): Payer: Self-pay | Admitting: Cardiology

## 2022-09-15 ENCOUNTER — Other Ambulatory Visit: Payer: Self-pay

## 2022-09-15 ENCOUNTER — Ambulatory Visit (HOSPITAL_COMMUNITY)
Admission: RE | Admit: 2022-09-15 | Discharge: 2022-09-15 | Disposition: A | Discharge status: Home / Self Care | Payer: Medicaid Other | Attending: Cardiology | Admitting: Cardiology

## 2022-09-15 ENCOUNTER — Ambulatory Visit (HOSPITAL_COMMUNITY): Payer: Medicaid Other | Admitting: Anesthesiology

## 2022-09-15 ENCOUNTER — Ambulatory Visit (HOSPITAL_BASED_OUTPATIENT_CLINIC_OR_DEPARTMENT_OTHER): Payer: Medicaid Other | Admitting: Anesthesiology

## 2022-09-15 ENCOUNTER — Encounter (HOSPITAL_COMMUNITY)
Admission: RE | Disposition: A | Discharge status: Home / Self Care | Payer: Self-pay | Source: Home / Self Care | Attending: Cardiology

## 2022-09-15 DIAGNOSIS — G4733 Obstructive sleep apnea (adult) (pediatric): Secondary | ICD-10-CM | POA: Insufficient documentation

## 2022-09-15 DIAGNOSIS — I4892 Unspecified atrial flutter: Secondary | ICD-10-CM | POA: Insufficient documentation

## 2022-09-15 DIAGNOSIS — Z7901 Long term (current) use of anticoagulants: Secondary | ICD-10-CM | POA: Diagnosis not present

## 2022-09-15 DIAGNOSIS — I2510 Atherosclerotic heart disease of native coronary artery without angina pectoris: Secondary | ICD-10-CM | POA: Insufficient documentation

## 2022-09-15 DIAGNOSIS — I509 Heart failure, unspecified: Secondary | ICD-10-CM | POA: Diagnosis not present

## 2022-09-15 DIAGNOSIS — I110 Hypertensive heart disease with heart failure: Secondary | ICD-10-CM | POA: Insufficient documentation

## 2022-09-15 DIAGNOSIS — I4891 Unspecified atrial fibrillation: Secondary | ICD-10-CM | POA: Insufficient documentation

## 2022-09-15 DIAGNOSIS — N289 Disorder of kidney and ureter, unspecified: Secondary | ICD-10-CM | POA: Diagnosis not present

## 2022-09-15 HISTORY — PX: CARDIOVERSION: SHX1299

## 2022-09-15 SURGERY — CARDIOVERSION
Anesthesia: General

## 2022-09-15 MED ORDER — LIDOCAINE 2% (20 MG/ML) 5 ML SYRINGE
INTRAMUSCULAR | Status: DC | PRN
  Administered 2022-09-15: 20 mg via INTRAVENOUS

## 2022-09-15 MED ORDER — PROPOFOL 10 MG/ML IV BOLUS
INTRAVENOUS | Status: DC | PRN
  Administered 2022-09-15: 80 mg via INTRAVENOUS

## 2022-09-15 MED ORDER — SODIUM CHLORIDE 0.9 % IV SOLN: INTRAVENOUS | Status: DC

## 2022-09-15 NOTE — Anesthesia Postprocedure Evaluation (Signed)
Anesthesia Post Note  Patient: Paul [name]  Procedure(s) Performed: CARDIOVERSION     Patient location during evaluation: PACU Anesthesia Type: General Level of consciousness: awake and alert Pain management: pain level controlled Vital Signs Assessment: post-procedure vital signs reviewed and stable Respiratory status: spontaneous breathing, nonlabored ventilation, respiratory function stable and patient connected to nasal cannula oxygen Cardiovascular status: blood pressure returned to baseline and stable Postop Assessment: no apparent nausea or vomiting Anesthetic complications: no   No notable events documented.  Last Vitals:  Vitals:   09/15/22 0900 09/15/22 0910  BP: 107/71 108/84  Pulse: 79 72  Resp: 20 20  Temp:    SpO2: 95% 96%    Last Pain:  Vitals:   09/15/22 0910  TempSrc:   PainSc: 0-No pain                 Tiajuana Amass

## 2022-09-15 NOTE — Transfer of Care (Signed)
Immediate Anesthesia Transfer of Care Note  Patient: Paul [name]  Procedure(s) Performed: CARDIOVERSION  Patient Location: Endoscopy Unit  Anesthesia Type:General  Level of Consciousness: awake, alert  and oriented  Airway & Oxygen Therapy: Patient Spontanous Breathing  Post-op Assessment: Report given to RN and Post -op Vital signs reviewed and stable  Post vital signs: Reviewed and stable  Last Vitals:  Vitals Value Taken Time  BP    Temp    Pulse    Resp    SpO2      Last Pain:  Vitals:   09/15/22 0735  TempSrc: Oral  PainSc: 0-No pain         Complications: No notable events documented.

## 2022-09-15 NOTE — CV Procedure (Signed)
    Electrical Cardioversion Procedure Note Paul [name] [id number] 1961/05/19  Procedure: Electrical Cardioversion Indications:  Atrial Flutter  Time Out: Verified patient identification, verified procedure,medications/allergies/relevent history reviewed, required imaging and test results available.  Performed  Procedure Details  The patient was NPO after midnight. Anesthesia was administered at the beside  by Dr.Fitzgerald with 80mg  of propofol and 20mg  of Lidocaine  Cardioversion was done with synchronized biphasic defibrillation with AP pads with 150watts.  The patient converted to normal sinus rhythm. The patient tolerated the procedure well   IMPRESSION:  Successful cardioversion of atrial fflutter    Paul [name] 09/15/2022, 7:42 AM

## 2022-09-15 NOTE — Anesthesia Preprocedure Evaluation (Signed)
Anesthesia Evaluation  Patient identified by MRN, date of birth, ID band Patient awake    Reviewed: Allergy & Precautions, NPO status , Patient's Chart, lab work & pertinent test results  Airway Mallampati: II  TM Distance: >3 FB     Dental  (+) Dental Advisory Given   Pulmonary neg pulmonary ROS,    breath sounds clear to auscultation       Cardiovascular hypertension, Pt. on medications and Pt. on home beta blockers + CAD and +CHF  + dysrhythmias  Rhythm:Irregular Rate:Normal     Neuro/Psych negative neurological ROS     GI/Hepatic negative GI ROS, Neg liver ROS,   Endo/Other  negative endocrine ROS  Renal/GU Renal InsufficiencyRenal disease     Musculoskeletal   Abdominal   Peds  Hematology negative hematology ROS (+)   Anesthesia Other Findings   Reproductive/Obstetrics                             Anesthesia Physical Anesthesia Plan  ASA: 3  Anesthesia Plan: General   Post-op Pain Management:    Induction: Intravenous  PONV Risk Score and Plan: 2 and Treatment may vary due to age or medical condition  Airway Management Planned: Natural Airway and Mask  Additional Equipment:   Intra-op Plan:   Post-operative Plan:   Informed Consent: I have reviewed the patients History and Physical, chart, labs and discussed the procedure including the risks, benefits and alternatives for the proposed anesthesia with the patient or authorized representative who has indicated his/her understanding and acceptance.       Plan Discussed with:   Anesthesia Plan Comments:         Anesthesia Quick Evaluation

## 2022-09-15 NOTE — Interval H&P Note (Signed)
History and Physical Interval Note:  09/15/2022 7:39 AM  Paul [name]  has presented today for surgery, with the diagnosis of atrial  fibrillation.  The various methods of treatment have been discussed with the patient and family. After consideration of risks, benefits and other options for treatment, the patient has consented to  Procedure(s): CARDIOVERSION (N/A) as a surgical intervention.  The patient's history has been reviewed, patient examined, no change in status, stable for surgery.  I have reviewed the patient's chart and labs.  Questions were answered to the patient's satisfaction.     Fransico Him

## 2022-09-16 ENCOUNTER — Encounter (HOSPITAL_COMMUNITY): Payer: Self-pay | Admitting: Cardiology

## 2022-09-17 ENCOUNTER — Other Ambulatory Visit: Payer: Self-pay | Admitting: Student

## 2022-10-13 ENCOUNTER — Encounter: Payer: Self-pay | Admitting: Cardiovascular Disease

## 2022-10-13 ENCOUNTER — Ambulatory Visit: Payer: Medicaid Other | Attending: Cardiovascular Disease | Admitting: Cardiovascular Disease

## 2022-10-13 VITALS — BP 128/72 | HR 81 | Ht 75.0 in | Wt 287.0 lb

## 2022-10-13 DIAGNOSIS — I4892 Unspecified atrial flutter: Secondary | ICD-10-CM | POA: Diagnosis not present

## 2022-10-13 NOTE — Patient Instructions (Signed)
Medication Instructions:  Your physician recommends that you continue on your current medications as directed. Please refer to the Current Medication list given to you today.  *If you need a refill on your cardiac medications before your next appointment, please call your pharmacy*  Lab Work: BMET and CBC If you have labs (blood work) drawn today and your tests are completely normal, you will receive your results only by: MyChart Message (if you have MyChart) OR A paper copy in the mail If you have any lab test that is abnormal or we need to change your treatment, we will call you to review the results.  Testing/Procedures: Your physician has recommended that you have an ablation. Catheter ablation is a medical procedure used to treat some cardiac arrhythmias (irregular heartbeats). During catheter ablation, a long, thin, flexible tube is put into a blood vessel in your groin (upper thigh), or neck. This tube is called an ablation catheter. It is then guided to your heart through the blood vessel. Radio frequency waves destroy small areas of heart tissue where abnormal heartbeats may cause an arrhythmia to start. Please see the instruction sheet given to you today.   Follow-Up: At [facility], you and your health needs are our priority.  As part of our continuing mission to provide you with exceptional heart care, we have created designated Provider Care Teams.  These Care Teams include your primary Cardiologist (physician) and Advanced Practice Providers (APPs -  Physician Assistants and Nurse Practitioners) who all work together to provide you with the care you need, when you need it.  Your next appointment:   See instruction letter  Important Information About Sugar       

## 2022-10-13 NOTE — Progress Notes (Signed)
Electrophysiology Office Note:    Date:  10/13/2022   ID:  Paul [name], DOB Dec 21, 1960, MRN [id number]  PCP:  Steffanie Rainwater, MD   [facility] Providers Cardiologist:  None Electrophysiologist:  Maurice Small, MD  Advanced Heart Failure:  Arvilla Meres, MD     Referring MD: Danice Goltz, PA   Chief complaint: atrial flutter  History of Present Illness:    Paul [name] is a 61 y.o. male with a hx of chronic systolic CHF, CAD, HTN, OSA referred for management of atrial flutter.  He was first diagnosed with atrial flutter in about Feb 2022 after presenting to his PCP with lightheadedness and palpitations. He has now had at least one recurrence and has required two cardioversions to terminate flutter. He is on xarelto for CHADS2Vasc of 3.  He has a history of CHFrEF with severely reduced EF. EF has recovered to normal. MRI showed LGE suggestive of possible infiltrative disease. PYP was negative. He had an apical LV thrombus.   Past Medical History:  Diagnosis Date   CHF (congestive heart failure) (HCC)    Dyspnea    History of hip replacement    Hypertension     Past Surgical History:  Procedure Laterality Date   CARDIOVERSION N/A 01/08/2022   Procedure: CARDIOVERSION;  Surgeon: Jodelle Red, MD;  Location: [facility] ENDOSCOPY;  Service: Cardiovascular;  Laterality: N/A;   CARDIOVERSION N/A 04/30/2022   Procedure: CARDIOVERSION;  Surgeon: Vesta Mixer, MD;  Location: [facility] ENDOSCOPY;  Service: Cardiovascular;  Laterality: N/A;   CARDIOVERSION N/A 09/15/2022   Procedure: CARDIOVERSION;  Surgeon: Quintella Reichert, MD;  Location: [facility] ENDOSCOPY;  Service: Cardiovascular;  Laterality: N/A;   RIGHT/LEFT HEART CATH AND CORONARY ANGIOGRAPHY N/A 10/17/2020   Procedure: RIGHT/LEFT HEART CATH AND CORONARY ANGIOGRAPHY;  Surgeon: Dolores Patty, MD;  Location: [facility] INVASIVE CV LAB;  Service: Cardiovascular;  Laterality: N/A;   TOTAL HIP ARTHROPLASTY       Current Medications: No outpatient medications have been marked as taking for the 10/13/22 encounter (Appointment) with Thera Basden, Roberts Gaudy, MD.     Allergies:   Shellfish allergy   Social History   Socioeconomic History   Marital status: Significant Other    Spouse name: Not on file   Number of children: Not on file   Years of education: Not on file   Highest education level: Not on file  Occupational History   Not on file  Tobacco Use   Smoking status: Never   Smokeless tobacco: Former    Types: Chew   Tobacco comments:    Former chew/dip 12/23/21  Vaping Use   Vaping Use: Never used  Substance and Sexual Activity   Alcohol use: Yes    Alcohol/week: 4.0 - 8.0 standard drinks of alcohol    Types: 4 - 8 Cans of beer per week    Comment: 1-2 beers every other day 12/23/21   Drug use: Not Currently   Sexual activity: Not on file  Other Topics Concern   Not on file  Social History Narrative   Not on file   Social Determinants of Health   Financial Resource Strain: Not on file  Food Insecurity: Not on file  Transportation Needs: Not on file  Physical Activity: Not on file  Stress: Not on file  Social Connections: Not on file     Family History: The patient's family history includes CAD in his father; Heart failure in his father and sister; Hypertension in his mother  and sister; Pulmonary Hypertension in his mother.  ROS:   Please see the history of present illness.    All other systems reviewed and are negative.  EKGs/Labs/Other Studies Reviewed Today:     TTE 09/16/2021  1. Left ventricular ejection fraction, by estimation, is 60 to 65%. The  left ventricle has normal function. The left ventricle has no regional  wall motion abnormalities. There is moderate left ventricular hypertrophy.  Left ventricular diastolic parameters are consistent with Grade I diastolic dysfunction (impaired relaxation).   2. Right ventricular systolic function is normal. The  right ventricular  size is normal. There is normal pulmonary artery systolic pressure.   3. Left atrial size was severely dilated.   4. The mitral valve is normal in structure. Trivial mitral valve  regurgitation. No evidence of mitral stenosis.   5. The aortic valve is tricuspid. There is mild calcification of the  aortic valve. Aortic valve regurgitation is not visualized. No aortic  stenosis is present.   6. The inferior vena cava is normal in size with greater than 50%  respiratory variability, suggesting right atrial pressure of 3 mmHg.   EKG:  Last EKG results: today - sinus rhythm   Recent Labs: 04/22/2022: Magnesium 2.2 09/08/2022: BUN 31; Creatinine, Ser 1.60; Hemoglobin 13.6; Platelets 261; Potassium 4.5; Sodium 134     Physical Exam:    VS:  There were no vitals taken for this visit.    Wt Readings from Last 3 Encounters:  09/15/22 282 lb 10.1 oz (128.2 kg)  09/08/22 282 lb 9.6 oz (128.2 kg)  05/13/22 285 lb 6.4 oz (129.5 kg)     GEN: Well nourished, well developed in no acute distress CARDIAC: RRR, no murmurs, rubs, gallops RESPIRATORY:  Normal work of breathing MUSCULOSKELETAL: no edema    ASSESSMENT & PLAN:    Atrial flutter: ECGs with documented flutter. Atrial are severely dilated, so there is some possibility that he could have left-sided arrhythmia. We discussed the indication, rationale, logistics, anticipated benefits, and potential risks of the ablation procedure including but not limited to -- bleed at the groin access site, chest pain, damage to nearby organs such as the diaphragm, lungs, or esophagus, need for a drainage tube, or prolonged hospitalization. I explained that the risk for stroke, heart attack, need for open chest surgery, or even death is very low but not zero. he  expressed understanding and wishes to proceed. CHF recovered EF: on farxiga 10, entresto 97/103, spironolactone 97/103, carvedilol 25        Medication Adjustments/Labs and  Tests Ordered: Current medicines are reviewed at length with the patient today.  Concerns regarding medicines are outlined above.  No orders of the defined types were placed in this encounter.  No orders of the defined types were placed in this encounter.    Signed, Melida [place], MD  10/13/2022 10:33 AM    [place]

## 2022-10-17 ENCOUNTER — Other Ambulatory Visit: Payer: Self-pay | Admitting: Student

## 2022-10-18 ENCOUNTER — Ambulatory Visit (HOSPITAL_BASED_OUTPATIENT_CLINIC_OR_DEPARTMENT_OTHER): Payer: Medicaid Other | Attending: Physician Assistant | Admitting: Cardiology

## 2022-10-18 VITALS — Ht 75.0 in | Wt 280.0 lb

## 2022-10-18 DIAGNOSIS — R0681 Apnea, not elsewhere classified: Secondary | ICD-10-CM

## 2022-10-18 DIAGNOSIS — R0683 Snoring: Secondary | ICD-10-CM | POA: Diagnosis present

## 2022-10-18 DIAGNOSIS — G4733 Obstructive sleep apnea (adult) (pediatric): Secondary | ICD-10-CM | POA: Diagnosis not present

## 2022-10-18 DIAGNOSIS — I4892 Unspecified atrial flutter: Secondary | ICD-10-CM | POA: Diagnosis not present

## 2022-10-19 NOTE — Procedures (Signed)
    Patient Name: Paul [name], Paul [name] Date: 10/18/2022 Gender: Male D.O.B: 01-Jan-1961 Age (years): 10 Referring Provider: Alphonzo Severance PA Height (inches): 75 Interpreting Physician: Armanda Magic MD, ABSM Weight (lbs): 280 RPSGT: Armen Pickup BMI: 35 MRN: [id number] Neck Size: 18.50  CLINICAL INFORMATION Sleep Study Type: NPSG  Indication for sleep study: N/A  Epworth Sleepiness Score: 1  SLEEP STUDY TECHNIQUE As per the AASM Manual for the Scoring of Sleep and Associated Events v2.3 (April 2016) with a hypopnea requiring 4% desaturations.  The channels recorded and monitored were frontal, central and occipital EEG, electrooculogram (EOG), submentalis EMG (chin), nasal and oral airflow, thoracic and abdominal wall motion, anterior tibialis EMG, snore microphone, electrocardiogram, and pulse oximetry.  MEDICATIONS Medications self-administered by patient taken the night of the study : COREG, Eliquis, entres to  SLEEP ARCHITECTURE The study was initiated at 10:55:08 PM and ended at 4:49:53 AM.  Sleep onset time was 9.7 minutes and the sleep efficiency was 41.2%. The total sleep time was 146 minutes.  Stage REM latency was 266.5 minutes.  The patient spent 48.29% of the night in stage N1 sleep, 37.33% in stage N2 sleep, 0.00% in stage N3 and 14.4% in REM.  Alpha intrusion was absent.  Supine sleep was 12.33%.  RESPIRATORY PARAMETERS The overall apnea/hypopnea index (AHI) was 27.1 per hour. There were 10 total apneas, including 10 obstructive, 0 central and 0 mixed apneas. There were 56 hypopneas and 54 RERAs.  The AHI during Stage REM sleep was 17.1 per hour.  AHI while supine was 3.3 per hour.  The mean oxygen saturation was 91.65%. The minimum SpO2 during sleep was 86.00%.  moderate snoring was noted during this study.  CARDIAC DATA The 2 lead EKG demonstrated atrial flutter. The mean heart rate was 99.60 beats per minute. Other EKG findings include: PVCs.  LEG  MOVEMENT DATA The total PLMS were 0 with a resulting PLMS index of 0.00. Associated arousal with leg movement index was 0.0 .  IMPRESSIONS - Moderate obstructive sleep apnea occurred during this study (AHI = 27.1/h). - Moderate oxygen desaturation was noted during this study (Min O2 = 86.00%). - The patient snored with moderate snoring volume. - Atrial Flutter and PVCs were noted during this study. - Clinically significant periodic limb movements did not occur during sleep. No significant associated arousals.  DIAGNOSIS - Obstructive Sleep Apnea (G47.33) - Nocturnal Hypoxemia (G47.36) - Atrial Flutter  RECOMMENDATIONS - Therapeutic CPAP titration to determine optimal pressure required to alleviate sleep disordered breathing. - Avoid alcohol, sedatives and other CNS depressants that may worsen sleep apnea and disrupt normal sleep architecture. - Sleep hygiene should be reviewed to assess factors that may improve sleep quality. - Weight management and regular exercise should be initiated or continued if appropriate.  [Electronically signed] 10/19/2022 10:27 PM  Armanda Magic MD, ABSM Diplomate, American Board of Sleep Medicine

## 2022-11-10 ENCOUNTER — Telehealth: Payer: Self-pay | Admitting: *Deleted

## 2022-11-10 DIAGNOSIS — I10 Essential (primary) hypertension: Secondary | ICD-10-CM

## 2022-11-10 DIAGNOSIS — R0683 Snoring: Secondary | ICD-10-CM

## 2022-11-10 DIAGNOSIS — R0681 Apnea, not elsewhere classified: Secondary | ICD-10-CM

## 2022-11-10 NOTE — Telephone Encounter (Signed)
The patient has been notified of the result and verbalized understanding.  All questions (if any) were answered. Latrelle Dodrill, CMA 11/10/2022 1:51 PM    Pt is aware and agreeable to titration,will precert

## 2022-11-10 NOTE — Telephone Encounter (Signed)
-----   Message from Gaynelle Cage, [place] sent at 10/20/2022 10:24 AM EST -----  ----- Message ----- From: Quintella Reichert, MD Sent: 10/19/2022  10:29 PM EST To: Cv Div Sleep Studies  Please let patient know that they have sleep apnea.  Recommend therapeutic CPAP titration for treatment of patient's sleep disordered breathing.  If unable to perform an in lab titration then initiate ResMed auto CPAP from 4 to 15cm H2O with heated humidity and mask of choice and overnight pulse ox on CPAP.

## 2022-11-15 ENCOUNTER — Other Ambulatory Visit (HOSPITAL_COMMUNITY): Payer: Self-pay | Admitting: Internal Medicine

## 2022-11-15 ENCOUNTER — Other Ambulatory Visit: Payer: Self-pay | Admitting: Student

## 2022-11-19 NOTE — Telephone Encounter (Signed)
Please call Paul [name] to schedule a follow up with me in late January or early February after his ablation. Thanks.

## 2022-11-25 ENCOUNTER — Ambulatory Visit: Payer: Medicaid Other | Attending: Cardiovascular Disease

## 2022-11-25 DIAGNOSIS — I4892 Unspecified atrial flutter: Secondary | ICD-10-CM

## 2022-11-25 LAB — CBC WITH DIFFERENTIAL/PLATELET
Basophils Absolute: 0 10*3/uL (ref 0.0–0.2)
Basos: 0 %
EOS (ABSOLUTE): 0.2 10*3/uL (ref 0.0–0.4)
Eos: 4 %
Hematocrit: 40.2 % (ref 37.5–51.0)
Hemoglobin: 13.4 g/dL (ref 13.0–17.7)
Lymphocytes Absolute: 2.4 10*3/uL (ref 0.7–3.1)
Lymphs: 41 %
MCH: 27.2 pg (ref 26.6–33.0)
MCHC: 33.3 g/dL (ref 31.5–35.7)
MCV: 82 fL (ref 79–97)
Monocytes Absolute: 0.7 10*3/uL (ref 0.1–0.9)
Monocytes: 13 %
Neutrophils Absolute: 2.4 10*3/uL (ref 1.4–7.0)
Neutrophils: 42 %
Platelets: 288 10*3/uL (ref 150–450)
RBC: 4.92 x10E6/uL (ref 4.14–5.80)
RDW: 17.4 % — ABNORMAL HIGH (ref 11.6–15.4)
WBC: 5.8 10*3/uL (ref 3.4–10.8)

## 2022-11-25 LAB — BASIC METABOLIC PANEL
BUN/Creatinine Ratio: 21 (ref 10–24)
BUN: 37 mg/dL — ABNORMAL HIGH (ref 8–27)
CO2: 29 mmol/L (ref 20–29)
Calcium: 9.8 mg/dL (ref 8.6–10.2)
Chloride: 95 mmol/L — ABNORMAL LOW (ref 96–106)
Creatinine, Ser: 1.74 mg/dL — ABNORMAL HIGH (ref 0.76–1.27)
Glucose: 133 mg/dL — ABNORMAL HIGH (ref 70–99)
Potassium: 4.6 mmol/L (ref 3.5–5.2)
Sodium: 135 mmol/L (ref 134–144)
eGFR: 44 mL/min/{1.73_m2} — ABNORMAL LOW (ref >59)

## 2022-12-02 NOTE — Pre-Procedure Instructions (Signed)
Instructed patient on the following items: Arrival time 1200 Nothing to eat or drink after midnight No meds AM of procedure Responsible person to drive you home and stay with you for 24 hrs  Have you missed any doses of anti-coagulant Eliquis- hasn't missed any doses   

## 2022-12-03 ENCOUNTER — Other Ambulatory Visit: Payer: Self-pay

## 2022-12-03 ENCOUNTER — Ambulatory Visit (HOSPITAL_COMMUNITY): Payer: Medicaid Other | Admitting: Certified Registered Nurse Anesthetist

## 2022-12-03 ENCOUNTER — Encounter (HOSPITAL_COMMUNITY)
Admission: RE | Disposition: A | Discharge status: Home / Self Care | Payer: Self-pay | Source: Ambulatory Visit | Attending: Cardiovascular Disease

## 2022-12-03 ENCOUNTER — Ambulatory Visit (HOSPITAL_COMMUNITY)
Admission: RE | Admit: 2022-12-03 | Discharge: 2022-12-03 | Disposition: A | Discharge status: Home / Self Care | Payer: Medicaid Other | Source: Ambulatory Visit | Attending: Cardiovascular Disease | Admitting: Cardiovascular Disease

## 2022-12-03 ENCOUNTER — Ambulatory Visit (HOSPITAL_BASED_OUTPATIENT_CLINIC_OR_DEPARTMENT_OTHER): Payer: Medicaid Other | Admitting: Certified Registered Nurse Anesthetist

## 2022-12-03 DIAGNOSIS — Z6835 Body mass index (BMI) 35.0-35.9, adult: Secondary | ICD-10-CM | POA: Insufficient documentation

## 2022-12-03 DIAGNOSIS — I110 Hypertensive heart disease with heart failure: Secondary | ICD-10-CM | POA: Diagnosis not present

## 2022-12-03 DIAGNOSIS — I2510 Atherosclerotic heart disease of native coronary artery without angina pectoris: Secondary | ICD-10-CM | POA: Diagnosis not present

## 2022-12-03 DIAGNOSIS — E119 Type 2 diabetes mellitus without complications: Secondary | ICD-10-CM | POA: Diagnosis not present

## 2022-12-03 DIAGNOSIS — Z7901 Long term (current) use of anticoagulants: Secondary | ICD-10-CM | POA: Insufficient documentation

## 2022-12-03 DIAGNOSIS — I509 Heart failure, unspecified: Secondary | ICD-10-CM

## 2022-12-03 DIAGNOSIS — I4892 Unspecified atrial flutter: Secondary | ICD-10-CM | POA: Insufficient documentation

## 2022-12-03 DIAGNOSIS — I5022 Chronic systolic (congestive) heart failure: Secondary | ICD-10-CM | POA: Diagnosis not present

## 2022-12-03 DIAGNOSIS — E669 Obesity, unspecified: Secondary | ICD-10-CM | POA: Insufficient documentation

## 2022-12-03 DIAGNOSIS — G4733 Obstructive sleep apnea (adult) (pediatric): Secondary | ICD-10-CM | POA: Insufficient documentation

## 2022-12-03 DIAGNOSIS — Z8249 Family history of ischemic heart disease and other diseases of the circulatory system: Secondary | ICD-10-CM | POA: Insufficient documentation

## 2022-12-03 DIAGNOSIS — Z87891 Personal history of nicotine dependence: Secondary | ICD-10-CM | POA: Diagnosis not present

## 2022-12-03 DIAGNOSIS — I4891 Unspecified atrial fibrillation: Secondary | ICD-10-CM | POA: Diagnosis not present

## 2022-12-03 DIAGNOSIS — Z79899 Other long term (current) drug therapy: Secondary | ICD-10-CM | POA: Insufficient documentation

## 2022-12-03 DIAGNOSIS — E785 Hyperlipidemia, unspecified: Secondary | ICD-10-CM | POA: Diagnosis not present

## 2022-12-03 DIAGNOSIS — Z7984 Long term (current) use of oral hypoglycemic drugs: Secondary | ICD-10-CM | POA: Diagnosis not present

## 2022-12-03 HISTORY — PX: A-FLUTTER ABLATION: EP1230

## 2022-12-03 SURGERY — A-FLUTTER ABLATION
Anesthesia: General

## 2022-12-03 MED ORDER — HEPARIN SODIUM (PORCINE) 1000 UNIT/ML IJ SOLN
INTRAMUSCULAR | Status: DC | PRN
  Administered 2022-12-03: 1000 [IU] via INTRAVENOUS

## 2022-12-03 MED ORDER — SUGAMMADEX SODIUM 200 MG/2ML IV SOLN
INTRAVENOUS | Status: DC | PRN
  Administered 2022-12-03: 300 mg via INTRAVENOUS

## 2022-12-03 MED ORDER — ROCURONIUM BROMIDE 10 MG/ML (PF) SYRINGE
PREFILLED_SYRINGE | INTRAVENOUS | Status: DC | PRN
  Administered 2022-12-03: 80 mg via INTRAVENOUS

## 2022-12-03 MED ORDER — SODIUM CHLORIDE 0.9 % IV SOLN: 250.0000 mL | INTRAVENOUS | Status: DC | PRN

## 2022-12-03 MED ORDER — LIDOCAINE 2% (20 MG/ML) 5 ML SYRINGE
INTRAMUSCULAR | Status: DC | PRN
  Administered 2022-12-03: 100 mg via INTRAVENOUS

## 2022-12-03 MED ORDER — ONDANSETRON HCL 4 MG/2ML IJ SOLN
INTRAMUSCULAR | Status: DC | PRN
  Administered 2022-12-03: 4 mg via INTRAVENOUS

## 2022-12-03 MED ORDER — PHENYLEPHRINE HCL-NACL 20-0.9 MG/250ML-% IV SOLN
INTRAVENOUS | Status: DC | PRN
  Administered 2022-12-03: 50 ug/min via INTRAVENOUS

## 2022-12-03 MED ORDER — FENTANYL CITRATE (PF) 100 MCG/2ML IJ SOLN
INTRAMUSCULAR | Status: DC | PRN
  Administered 2022-12-03 (×2): 50 ug via INTRAVENOUS

## 2022-12-03 MED ORDER — HEPARIN (PORCINE) IN NACL 1000-0.9 UT/500ML-% IV SOLN
INTRAVENOUS | Status: DC | PRN
  Administered 2022-12-03 (×2): 500 mL

## 2022-12-03 MED ORDER — SODIUM CHLORIDE 0.9 % IV SOLN: INTRAVENOUS | Status: DC

## 2022-12-03 MED ORDER — SODIUM CHLORIDE 0.9% FLUSH: 3.0000 mL | Freq: Two times a day (BID) | INTRAVENOUS | Status: DC

## 2022-12-03 MED ORDER — ACETAMINOPHEN 500 MG PO TABS
ORAL_TABLET | ORAL | Status: AC
  Administered 2022-12-03: 1000 mg via ORAL
  Filled 2022-12-03: qty 2

## 2022-12-03 MED ORDER — SODIUM CHLORIDE 0.9% FLUSH: 3.0000 mL | INTRAVENOUS | Status: DC | PRN

## 2022-12-03 MED ORDER — DEXAMETHASONE SODIUM PHOSPHATE 10 MG/ML IJ SOLN
INTRAMUSCULAR | Status: DC | PRN
  Administered 2022-12-03: 10 mg via INTRAVENOUS

## 2022-12-03 MED ORDER — HEPARIN SODIUM (PORCINE) 1000 UNIT/ML IJ SOLN
INTRAMUSCULAR | Status: AC
  Filled 2022-12-03: qty 10

## 2022-12-03 MED ORDER — ACETAMINOPHEN 325 MG PO TABS: 650.0000 mg | ORAL_TABLET | ORAL | Status: DC | PRN

## 2022-12-03 MED ORDER — PROPOFOL 10 MG/ML IV BOLUS
INTRAVENOUS | Status: DC | PRN
  Administered 2022-12-03: 190 mg via INTRAVENOUS

## 2022-12-03 MED ORDER — ACETAMINOPHEN 500 MG PO TABS: 1000.0000 mg | ORAL_TABLET | Freq: Once | ORAL | Status: AC

## 2022-12-03 MED ORDER — PHENYLEPHRINE 80 MCG/ML (10ML) SYRINGE FOR IV PUSH (FOR BLOOD PRESSURE SUPPORT)
PREFILLED_SYRINGE | INTRAVENOUS | Status: DC | PRN
  Administered 2022-12-03: 80 ug via INTRAVENOUS
  Administered 2022-12-03: 160 ug via INTRAVENOUS
  Administered 2022-12-03: 80 ug via INTRAVENOUS
  Administered 2022-12-03: 160 ug via INTRAVENOUS

## 2022-12-03 MED ORDER — ONDANSETRON HCL 4 MG/2ML IJ SOLN: 4.0000 mg | Freq: Four times a day (QID) | INTRAMUSCULAR | Status: DC | PRN

## 2022-12-03 SURGICAL SUPPLY — 14 items
CATH 8FR REPROCESSED SOUNDSTAR (CATHETERS) ×1 IMPLANT
CATH 8FR SOUNDSTAR REPROCESSED (CATHETERS) IMPLANT
CATH SMTCH THERMOCOOL SF FJ (CATHETERS) IMPLANT
CATH WEBSTER BI DIR CS D-F CRV (CATHETERS) IMPLANT
CLOSURE PERCLOSE PROSTYLE (VASCULAR PRODUCTS) IMPLANT
DEVICE CLOSURE MYNXGRIP 6/7F (Vascular Products) IMPLANT
PACK EP LATEX FREE (CUSTOM PROCEDURE TRAY) ×1
PACK EP LF (CUSTOM PROCEDURE TRAY) ×1 IMPLANT
PAD DEFIB RADIO PHYSIO CONN (PAD) ×1 IMPLANT
PATCH CARTO3 (PAD) IMPLANT
SHEATH CARTO VIZIGO SM CVD (SHEATH) IMPLANT
SHEATH PINNACLE 8F 10CM (SHEATH) IMPLANT
SHEATH PINNACLE 9F 10CM (SHEATH) IMPLANT
TUBING SMART ABLATE COOLFLOW (TUBING) IMPLANT

## 2022-12-03 NOTE — Anesthesia Postprocedure Evaluation (Signed)
Anesthesia Post Note  Patient: Paul [name]  Procedure(s) Performed: A-FLUTTER ABLATION     Patient location during evaluation: PACU Anesthesia Type: General Level of consciousness: awake and alert Pain management: pain level controlled Vital Signs Assessment: post-procedure vital signs reviewed and stable Respiratory status: spontaneous breathing, nonlabored ventilation, respiratory function stable and patient connected to nasal cannula oxygen Cardiovascular status: blood pressure returned to baseline and stable Postop Assessment: no apparent nausea or vomiting Anesthetic complications: no  No notable events documented.  Last Vitals:  Vitals:   12/03/22 1610 12/03/22 1611  BP: 99/65   Pulse: 79   Resp: 12   Temp:  36.6 C  SpO2: 98%     Last Pain:  Vitals:   12/03/22 1611  TempSrc: Temporal  PainSc:                  Lyza Houseworth S

## 2022-12-03 NOTE — Discharge Instructions (Signed)

## 2022-12-03 NOTE — Transfer of Care (Signed)
Immediate Anesthesia Transfer of Care Note  Patient: Paul [name]  Procedure(s) Performed: A-FLUTTER ABLATION  Patient Location: Cath Lab  Anesthesia Type:General  Level of Consciousness: awake, alert , and oriented  Airway & Oxygen Therapy: Patient Spontanous Breathing and Patient connected to nasal cannula oxygen  Post-op Assessment: Report given to RN and Post -op Vital signs reviewed and stable  Post vital signs: Reviewed and stable  Last Vitals:  Vitals Value Taken Time  BP    Temp    Pulse 79 12/03/22 1539  Resp 16 12/03/22 1539  SpO2 96 % 12/03/22 1539  Vitals shown include unvalidated device data.  Last Pain:  Vitals:   12/03/22 1249  TempSrc:   PainSc: 0-No pain         Complications: No notable events documented.

## 2022-12-03 NOTE — Anesthesia Preprocedure Evaluation (Addendum)
Anesthesia Evaluation  Patient identified by MRN, date of birth, ID band Patient awake    Reviewed: Allergy & Precautions, NPO status , Patient's Chart, lab work & pertinent test results, reviewed documented beta blocker date and time   Airway Mallampati: II  TM Distance: >3 FB     Dental no notable dental hx. (+) Dental Advisory Given   Pulmonary shortness of breath and with exertion   Pulmonary exam normal breath sounds clear to auscultation       Cardiovascular hypertension, Pt. on medications and Pt. on home beta blockers + CAD and +CHF  + dysrhythmias Atrial Fibrillation  Rhythm:Irregular Rate:Normal  Echo 09/2021 1. Left ventricular ejection fraction, by estimation, is 60 to 65%. The  left ventricle has normal function. The left ventricle has no regional  wall motion abnormalities. There is moderate left ventricular hypertrophy.  Left ventricular diastolic  parameters are consistent with Grade I diastolic dysfunction (impaired  relaxation).   2. Right ventricular systolic function is normal. The right ventricular  size is normal. There is normal pulmonary artery systolic pressure.   3. Left atrial size was severely dilated.   4. The mitral valve is normal in structure. Trivial mitral valve  regurgitation. No evidence of mitral stenosis.   5. The aortic valve is tricuspid. There is mild calcification of the  aortic valve. Aortic valve regurgitation is not visualized. No aortic  stenosis is present.   6. The inferior vena cava is normal in size with greater than 50%  respiratory variability, suggesting right atrial pressure of 3 mmHg.   Cardiac Cath 10/17/20  Prox LAD to Mid LAD lesion is 30% stenosed.  Mid LAD lesion is 30% stenosed.  Dist LAD lesion is 30% stenosed.  1st Diag lesion is 70% stenosed. 1. Mild CAD 2. 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 Elevated filling pressure     Neuro/Psych negative neurological ROS  negative psych ROS   GI/Hepatic negative GI ROS, Neg liver ROS,,,  Endo/Other  diabetes, Well Controlled, Type 2, Oral Hypoglycemic Agents  Hyperlipidemia  Renal/GU Renal InsufficiencyRenal disease  negative genitourinary   Musculoskeletal negative musculoskeletal ROS (+)    Abdominal  (+) + obese  Peds  Hematology  (+) Blood dyscrasia, anemia Eliquis therapy- last dose 1230am   Anesthesia Other Findings   Reproductive/Obstetrics                              Anesthesia Physical Anesthesia Plan  ASA: 3  Anesthesia Plan: General   Post-op Pain Management: Minimal or no pain anticipated   Induction: Intravenous  PONV Risk Score and Plan: 2 and Treatment may vary due to age or medical condition, Ondansetron and Dexamethasone  Airway Management Planned: Oral ETT  Additional Equipment: None  Intra-op Plan:   Post-operative Plan:   Informed Consent: I have reviewed the patients History and Physical, chart, labs and discussed the procedure including the risks, benefits and alternatives for the proposed anesthesia with the patient or authorized representative who has indicated his/her understanding and acceptance.     Dental advisory given  Plan Discussed with: Anesthesiologist and CRNA  Anesthesia Plan Comments:          Anesthesia Quick Evaluation

## 2022-12-03 NOTE — Anesthesia Procedure Notes (Signed)
Procedure Name: Intubation Date/Time: 12/03/2022 1:33 PM  Performed by: Colin Benton, CRNAPre-anesthesia Checklist: Patient identified, Emergency Drugs available, Suction available and Patient being monitored Patient Re-evaluated:Patient Re-evaluated prior to induction Oxygen Delivery Method: Circle system utilized Preoxygenation: Pre-oxygenation with 100% oxygen Induction Type: IV induction Ventilation: Mask ventilation without difficulty and Oral airway inserted - appropriate to patient size Laryngoscope Size: Mac and 4 Grade View: Grade II Tube type: Oral Tube size: 7.5 mm Number of attempts: 1 Airway Equipment and Method: Stylet and Oral airway Placement Confirmation: ETT inserted through vocal cords under direct vision, positive ETCO2 and breath sounds checked- equal and bilateral Secured at: 23 cm Tube secured with: Tape Dental Injury: Teeth and Oropharynx as per pre-operative assessment

## 2022-12-03 NOTE — Progress Notes (Signed)
Pt ambulated in hall with no signs of oozing from site  

## 2022-12-03 NOTE — H&P (Signed)
Electrophysiology Office Note:    Date:  12/03/2022   ID:  Paul [name], DOB [birth year], MRN [id number]  PCP:  Steffanie Rainwater, MD   [facility] Providers Cardiologist:  None Electrophysiologist:  Maurice Small, MD  Advanced Heart Failure:  Arvilla Meres, MD     Referring MD: No ref. provider found   Chief complaint: atrial flutter  History of Present Illness:    Paul [name] is a 62 y.o. male with a hx of chronic systolic CHF, CAD, HTN, OSA referred for management of atrial flutter.  He was first diagnosed with atrial flutter in about Feb 2022 after presenting to his PCP with lightheadedness and palpitations. He has now had at least one recurrence and has required two cardioversions to terminate flutter. He is on xarelto for CHADS2Vasc of 3.  He has a history of CHFrEF with severely reduced EF. EF has recovered to normal. MRI showed LGE suggestive of possible infiltrative disease. PYP was negative. He had an apical LV thrombus.   He presents today for ablation of flutter. I reviewed the patient's CT and labs. There was no LAA thrombus. he  has not missed any doses of anticoagulation, and he took his dose last night. There have been no changes in the patient's diagnoses, medications, or condition since our recent clinic visit.   Past Medical History:  Diagnosis Date   CHF (congestive heart failure) (HCC)    Dyspnea    History of hip replacement    Hypertension     Past Surgical History:  Procedure Laterality Date   CARDIOVERSION N/A 01/08/2022   Procedure: CARDIOVERSION;  Surgeon: Jodelle Red, MD;  Location: [facility] ENDOSCOPY;  Service: Cardiovascular;  Laterality: N/A;   CARDIOVERSION N/A 04/30/2022   Procedure: CARDIOVERSION;  Surgeon: Vesta Mixer, MD;  [facility] END[facility];  Service: Cardiovascular;  Laterality: N/A;   CARDIOVERSION N/A 09/15/2022   Procedure: CARDIOVERSION;  Surgeon: Quintella Reichert, MD;  Location: [facility] ENDOSCOPY;   Service: Cardiovascular;  Laterality: N/A;   RIGHT/LEFT HEART CATH AND CORONARY ANGIOGRAPHY N/A 10/17/2020   Procedure: RIGHT/LEFT HEART CATH AND CORONARY ANGIOGRAPHY;  Surgeon: Dolores Patty, MD;  Location: [facility] INVASIVE CV LAB;  Service: Cardiovascular;  Laterality: N/A;   TOTAL HIP ARTHROPLASTY      Current Medications: Current Meds  Medication Sig   atorvastatin (LIPITOR) 40 MG tablet TAKE 1 TABLET BY MOUTH EVERY DAY   carvedilol (COREG) 25 MG tablet Take 1 tablet (25 mg total) by mouth 2 (two) times daily.   dapagliflozin propanediol (FARXIGA) 10 MG TABS tablet TAKE 1 TABLET BY MOUTH EVERY DAY BEFORE BREAKFAST   diltiazem (CARDIZEM CD) 180 MG 24 hr capsule TAKE 1 CAPSULE BY MOUTH EVERY DAY   ELIQUIS 5 MG TABS tablet TAKE 1 TABLET BY MOUTH TWICE A DAY   ENTRESTO 97-103 MG TAKE 1 TABLET BY MOUTH TWICE A DAY   furosemide (LASIX) 40 MG tablet Take 1 tablet (40 mg total) by mouth daily. NEEDS FOLLOW UP APPOINTMENT FOR MORE REFILLS (Patient taking differently: Take 80 mg by mouth daily. NEEDS FOLLOW UP APPOINTMENT FOR MORE REFILLS)   ibuprofen (ADVIL) 200 MG tablet Take 400-600 mg by mouth every 8 (eight) hours as needed for moderate pain or mild pain.   spironolactone (ALDACTONE) 25 MG tablet TAKE 1 TABLET (25 MG TOTAL) BY MOUTH DAILY.   thiamine 100 MG tablet TAKE 1 TABLET BY MOUTH EVERY DAY     Allergies:   Shellfish allergy   Social History  Socioeconomic History   Marital status: Significant Other    Spouse name: Not on file   Number of children: Not on file   Years of education: Not on file   Highest education level: Not on file  Occupational History   Not on file  Tobacco Use   Smoking status: Never   Smokeless tobacco: Former    Types: Chew   Tobacco comments:    Former chew/dip 12/23/21  Vaping Use   Vaping Use: Never used  Substance and Sexual Activity   Alcohol use: Yes    Alcohol/week: 4.0 - 8.0 standard drinks of alcohol    Types: 4 - 8 Cans of beer per  week    Comment: 1-2 beers every other day 12/23/21   Drug use: Not Currently   Sexual activity: Not on file  Other Topics Concern   Not on file  Social History Narrative   Not on file   Social Determinants of Health   Financial Resource Strain: Not on file  Food Insecurity: Not on file  Transportation Needs: Not on file  Physical Activity: Not on file  Stress: Not on file  Social Connections: Not on file     Family History: The patient's family history includes CAD in his father; Heart failure in his father and sister; Hypertension in his mother and sister; Pulmonary Hypertension in his mother.  ROS:   Please see the history of present illness.    All other systems reviewed and are negative.  EKGs/Labs/Other Studies Reviewed Today:     TTE 09/16/2021  1. Left ventricular ejection fraction, by estimation, is 60 to 65%. The  left ventricle has normal function. The left ventricle has no regional  wall motion abnormalities. There is moderate left ventricular hypertrophy.  Left ventricular diastolic parameters are consistent with Grade I diastolic dysfunction (impaired relaxation).   2. Right ventricular systolic function is normal. The right ventricular  size is normal. There is normal pulmonary artery systolic pressure.   3. Left atrial size was severely dilated.   4. The mitral valve is normal in structure. Trivial mitral valve  regurgitation. No evidence of mitral stenosis.   5. The aortic valve is tricuspid. There is mild calcification of the  aortic valve. Aortic valve regurgitation is not visualized. No aortic  stenosis is present.   6. The inferior vena cava is normal in size with greater than 50%  respiratory variability, suggesting right atrial pressure of 3 mmHg.   EKG:  Last EKG results: today - sinus rhythm   Recent Labs: 04/22/2022: Magnesium 2.2 11/25/2022: BUN 37; Creatinine, Ser 1.74; Hemoglobin 13.4; Platelets 288; Potassium 4.6; Sodium 135     Physical  Exam:    VS:  BP (!) 118/92 Comment: rn aware  Pulse (!) 126 Comment: rn aware  Temp (!) 97.5 F (36.4 C) (Temporal)   Resp 18   Ht 6\' 3"  (1.905 m)   Wt 128.4 kg   SpO2 98%   BMI 35.37 kg/m     Wt Readings from Last 3 Encounters:  12/03/22 128.4 kg  10/18/22 127 kg  10/13/22 130.2 kg     GEN: Well nourished, well developed in no acute distress CARDIAC: RRR, no murmurs, rubs, gallops RESPIRATORY:  Normal work of breathing MUSCULOSKELETAL: no edema    ASSESSMENT & PLAN:    Atrial flutter: ECGs with documented flutter. Atrial are severely dilated, so there is some possibility that he could have left-sided arrhythmia. We discussed the indication, rationale, logistics, anticipated  benefits, and potential risks of the ablation procedure including but not limited to -- bleed at the groin access site, chest pain, damage to nearby organs such as the diaphragm, lungs, or esophagus, need for a drainage tube, or prolonged hospitalization. I explained that the risk for stroke, heart attack, need for open chest surgery, or even death is very low but not zero. he  expressed understanding and wishes to proceed. CHF recovered EF: on farxiga 10, entresto 97/103, spironolactone 97/103, carvedilol 25        Medication Adjustments/Labs and Tests Ordered: Current medicines are reviewed at length with the patient today.  Concerns regarding medicines are outlined above.  Orders Placed This Encounter  Procedures   Informed Consent Details: Physician/Practitioner Attestation; Transcribe to consent form and obtain patient signature   Initiate Pre-op Protocol   Void on call to EP Lab   Confirm CBC and BMP (or CMP) results within 7 days for inpatient and 30 days for outpatient:   Clip right and left femoral area PM before surgery   Clip right internal jugular area PM before surgery   Pre-admission testing diagnosis   EP STUDY   Insert peripheral IV   Meds ordered this encounter  Medications    0.9 %  sodium chloride infusion   acetaminophen (TYLENOL) tablet 1,000 mg   acetaminophen (TYLENOL) 500 MG tablet    Flavia Shipper M: cabinet override     Signed, Melida Quitter, MD  12/03/2022 1:08 PM    [place]

## 2022-12-04 ENCOUNTER — Encounter (HOSPITAL_COMMUNITY): Payer: Self-pay | Admitting: Cardiovascular Disease

## 2022-12-16 ENCOUNTER — Encounter: Payer: Self-pay | Admitting: Student

## 2022-12-16 ENCOUNTER — Ambulatory Visit (INDEPENDENT_AMBULATORY_CARE_PROVIDER_SITE_OTHER): Payer: Medicaid Other | Admitting: Student

## 2022-12-16 DIAGNOSIS — N1831 Chronic kidney disease, stage 3a: Secondary | ICD-10-CM

## 2022-12-16 DIAGNOSIS — G4733 Obstructive sleep apnea (adult) (pediatric): Secondary | ICD-10-CM

## 2022-12-16 DIAGNOSIS — Z87891 Personal history of nicotine dependence: Secondary | ICD-10-CM

## 2022-12-16 DIAGNOSIS — I4892 Unspecified atrial flutter: Secondary | ICD-10-CM | POA: Diagnosis not present

## 2022-12-16 DIAGNOSIS — I10 Essential (primary) hypertension: Secondary | ICD-10-CM

## 2022-12-16 DIAGNOSIS — I5082 Biventricular heart failure: Secondary | ICD-10-CM

## 2022-12-16 DIAGNOSIS — I130 Hypertensive heart and chronic kidney disease with heart failure and stage 1 through stage 4 chronic kidney disease, or unspecified chronic kidney disease: Secondary | ICD-10-CM

## 2022-12-16 NOTE — Progress Notes (Signed)
   CC: A flutter ablation follow-up  This is a telephone encounter between Paul [name] and Paul [name] on 12/16/2022 for follow-up on his recent a flutter ablation. The visit was conducted with the patient located at home and Paul [name] at [facility]. The patient's identity was confirmed using their DOB and current address. The patient has consented to being evaluated through a telephone encounter and understands the associated risks (an examination cannot be done and the patient may need to come in for an appointment) / benefits (allows the patient to remain at home, decreasing exposure to coronavirus). I personally spent 20 minutes on medical discussion.   HPI:  [name] is a 62 y.o. with PMH as below.   Please see A&P for assessment of the patient's acute and chronic medical conditions.   Past Medical History:  Diagnosis Date   CHF (congestive heart failure) (HCC)    Dyspnea    History of hip replacement    Hypertension    Review of Systems: Endorses some weight gain but denies shortness of breath, dyspnea on exertion, chest pain, palpitations, dizziness or fatigue.  Assessment & Plan:   See Encounters Tab for problem based charting.  Patient discussed with Dr.  Louann Liv, MD, MPH

## 2022-12-16 NOTE — Assessment & Plan Note (Addendum)
States his weight has been slightly up in the low 180s recently but has been stable in the 170s. He is compliant with all his medications including his Lasix. He has follow-up with cardiology next month for reevaluation after his recent a flutter ablation. He has refills on all his medications. Reports he used to take ibuprofen occasionally but was informed to stop taking it by cardiology after his recent a flutter ablation.  Plan: -Continue Lasix, Entresto, Coreg, Farxiga and spironolactone -Discontinue prn ibuprofen -Follow-up with cardiology on February 19th -Clinic follow-up in April or June

## 2022-12-16 NOTE — Assessment & Plan Note (Signed)
BMP at cardiology office 3 weeks ago showed slight rise in creatinine to 1.74 from 1.60 in October 2023. Patient likley with CKD in the setting of chronic heart failure. Unclear established baseline at this point as patient has had some normal creatinines over the last 2 years. Has follow-up with cardiology last months. He will need repeat BMP to make sure his kidney function is not worsening on his current medications. -Check BMP at next cardiology appt on February 19th.

## 2022-12-16 NOTE — Assessment & Plan Note (Signed)
Patient has some low blood pressures during her recent a flutter ablation however since the ablation, he states his blood pressures has been in the normal range with systolic ranging from 903Y-333O and diastolic ranging from the 60s to 70s.  He denies any headaches, dizziness or changes in his vision.  Plan: -Continue Coreg 25 mg twice daily -Continue Entresto 97-103 mg twice daily -Continue spironolactone 25 mg daily

## 2022-12-16 NOTE — Assessment & Plan Note (Signed)
Patient had a sleep study on October 18, 2022 that showed moderate obstructive sleep apnea occurred during with AHI = 27.1/h as well as some nighttime hypoxemia.  Patient states she is currently waiting CPAP titration study to determine his optimal pressure.  Patient has been feeling less fatigued since his a flutter ablation. -Pending CPAP titration study and CPAP

## 2022-12-16 NOTE — Patient Instructions (Signed)
Thank you, [place] for allowing [place] to provide your care today. Today we discussed your recent a flutter ablation, heart failure, blood pressure and sleep study.  I am glad you are feeling much better.  Make sure to follow-up with cardiology next month for reevaluation and lab work and follow-up with me in April or June.  My Chart Access: [email or web address]  Please follow-up with Dr. Coy Saunas in April or June  Please make sure to arrive 15 minutes prior to your next appointment. If you arrive late, you may be asked to reschedule.    We look forward to seeing you next time. Please call our clinic at [phone] if you have any questions or concerns. The best time to call is Monday-Friday from 9am-4pm, but there is someone available 24/7. If after hours or the weekend, call the main hospital number and ask for the Internal Medicine Resident On-Call. If you need medication refills, please notify your pharmacy one week in advance and they will send [place] a request.   Thank you for letting us take part in your care. Wishing you the best!  Lacinda Axon, MD 12/16/2022, 11:13 AM IM Resident, PGY-3 [place] 41:10

## 2022-12-16 NOTE — Assessment & Plan Note (Signed)
Patient is status post successful a flutter ablation on January 18th. Patient reports that since his ablation, this is the best he has felt in a very long time. He has not had any more dizziness, fatigue or palpitations. He is sleeping better has more energy. He denies any blurry vision, chest pain, shortness of breath or headache. States his heart rate has range from 58 to 80 since the ablation. He has follow-up with cardiology next month.  Plan: -Continue Eliquis 5 mg twice daily -Continue Coreg 25 mg twice daily -Continue diltiazem 180 mg daily -Follow-up with cardiology as scheduled on February 19th

## 2022-12-23 NOTE — Progress Notes (Signed)
Internal Medicine Clinic Attending  Case discussed with Dr. Amponsah  At the time of the visit.  We reviewed the resident's history and exam and pertinent patient test results.  I agree with the assessment, diagnosis, and plan of care documented in the resident's note.  

## 2023-01-04 ENCOUNTER — Encounter: Payer: Self-pay | Admitting: Cardiovascular Disease

## 2023-01-04 ENCOUNTER — Ambulatory Visit: Payer: Medicaid Other | Attending: Cardiovascular Disease | Admitting: Cardiovascular Disease

## 2023-01-04 VITALS — BP 92/64 | HR 75 | Ht 75.0 in | Wt 285.0 lb

## 2023-01-04 DIAGNOSIS — I483 Typical atrial flutter: Secondary | ICD-10-CM

## 2023-01-04 DIAGNOSIS — D6869 Other thrombophilia: Secondary | ICD-10-CM

## 2023-01-04 NOTE — Progress Notes (Signed)
Electrophysiology Office Note:    Date:  01/04/2023   ID:  Paul [name], DOB 1961/10/23, MRN [id number]  PCP:  Lacinda Axon, MD   [place] Providers Cardiologist:  None Electrophysiologist:  Melida Quitter, MD  Advanced Heart Failure:  Glori Bickers, MD     Referring MD: Lacinda Axon, MD   Chief complaint: atrial flutter  History of Present Illness:    Paul [name] is a 62 y.o. male with a hx of chronic systolic CHF, CAD, HTN, OSA referred for management of atrial flutter.  He was first diagnosed with atrial flutter in about Feb 2022 after presenting to his PCP with lightheadedness and palpitations. He has now had at least one recurrence and has required two cardioversions to terminate flutter. He is on xarelto for CHADS2Vasc of 3.  He has a history of CHFrEF with severely reduced EF. EF has recovered to normal. MRI showed LGE suggestive of possible infiltrative disease. PYP was negative. He had an apical LV thrombus.   Past Medical History:  Diagnosis Date   CHF (congestive heart failure) (HCC)    Dyspnea    History of hip replacement    Hypertension     Past Surgical History:  Procedure Laterality Date   A-FLUTTER ABLATION N/A 12/03/2022   Procedure: A-FLUTTER ABLATION;  Surgeon: Allexus Ovens, Yetta Barre, MD;  Location: [place] [facility];  Service: Cardiovascular;  Laterality: N/A;   CARDIOVERSION N/A 01/08/2022   Procedure: CARDIOVERSION;  Surgeon: Buford Dresser, MD;  Location: [facility] ENDOSCOPY;  Service: Cardiovascular;  Laterality: N/A;   CARDIOVERSION N/A 04/30/2022   Procedure: CARDIOVERSION;  Surgeon: Thayer Headings, MD;  Location: [facility] ENDOSCOPY;  Service: Cardiovascular;  Laterality: N/A;   CARDIOVERSION N/A 09/15/2022   Procedure: CARDIOVERSION;  Surgeon: Sueanne Margarita, MD;  Location: [facility] ENDOSCOPY;  Service: Cardiovascular;  Laterality: N/A;   RIGHT/LEFT HEART CATH AND CORONARY ANGIOGRAPHY N/A 10/17/2020   Procedure: RIGHT/LEFT  HEART CATH AND CORONARY ANGIOGRAPHY;  Surgeon: Jolaine Artist, MD;  Location: [place] [facility];  Service: Cardiovascular;  Laterality: N/A;   TOTAL HIP ARTHROPLASTY      Current Medications: Current Meds  Medication Sig   atorvastatin (LIPITOR) 40 MG tablet TAKE 1 TABLET BY MOUTH EVERY DAY   carvedilol (COREG) 25 MG tablet Take 1 tablet (25 mg total) by mouth 2 (two) times daily.   dapagliflozin propanediol (FARXIGA) 10 MG TABS tablet TAKE 1 TABLET BY MOUTH EVERY DAY BEFORE BREAKFAST   diltiazem (CARDIZEM CD) 180 MG 24 hr capsule TAKE 1 CAPSULE BY MOUTH EVERY DAY   ELIQUIS 5 MG TABS tablet TAKE 1 TABLET BY MOUTH TWICE A DAY   ENTRESTO 97-103 MG TAKE 1 TABLET BY MOUTH TWICE A DAY   furosemide (LASIX) 40 MG tablet Take 1 tablet (40 mg total) by mouth daily. NEEDS FOLLOW UP APPOINTMENT FOR MORE REFILLS (Patient taking differently: Take 80 mg by mouth daily. NEEDS FOLLOW UP APPOINTMENT FOR MORE REFILLS)   spironolactone (ALDACTONE) 25 MG tablet TAKE 1 TABLET (25 MG TOTAL) BY MOUTH DAILY.     Allergies:   Shellfish allergy   Social History   Socioeconomic History   Marital status: Significant Other    Spouse name: Not on file   Number of children: Not on file   Years of education: Not on file   Highest education level: Not on file  Occupational History   Not on file  Tobacco Use   Smoking status: Never   Smokeless tobacco: Former  Types: Chew   Tobacco comments:    Former chew/dip 12/23/21  Vaping Use   Vaping Use: Never used  Substance and Sexual Activity   Alcohol use: Yes    Alcohol/week: 4.0 - 8.0 standard drinks of alcohol    Types: 4 - 8 Cans of beer per week    Comment: 1-2 beers every other day 12/23/21   Drug use: Not Currently   Sexual activity: Not on file  Other Topics Concern   Not on file  Social History Narrative   Not on file   Social Determinants of Health   Financial Resource Strain: Not on file  Food Insecurity: Not on file  Transportation  Needs: Not on file  Physical Activity: Not on file  Stress: Not on file  Social Connections: Not on file     Family History: The patient's family history includes CAD in his father; Heart failure in his father and sister; Hypertension in his mother and sister; Pulmonary Hypertension in his mother.  ROS:   Please see the history of present illness.    All other systems reviewed and are negative.  EKGs/Labs/Other Studies Reviewed Today:     TTE 09/16/2021  1. Left ventricular ejection fraction, by estimation, is 60 to 65%. The  left ventricle has normal function. The left ventricle has no regional  wall motion abnormalities. There is moderate left ventricular hypertrophy.  Left ventricular diastolic parameters are consistent with Grade I diastolic dysfunction (impaired relaxation).   2. Right ventricular systolic function is normal. The right ventricular  size is normal. There is normal pulmonary artery systolic pressure.   3. Left atrial size was severely dilated.   4. The mitral valve is normal in structure. Trivial mitral valve  regurgitation. No evidence of mitral stenosis.   5. The aortic valve is tricuspid. There is mild calcification of the  aortic valve. Aortic valve regurgitation is not visualized. No aortic  stenosis is present.   6. The inferior vena cava is normal in size with greater than 50%  respiratory variability, suggesting right atrial pressure of 3 mmHg.   EKG:  Last EKG results: today - sinus rhythm   Recent Labs: 04/22/2022: Magnesium 2.2 11/25/2022: BUN 37; Creatinine, Ser 1.74; Hemoglobin 13.4; Platelets 288; Potassium 4.6; Sodium 135     Physical Exam:    VS:  There were no vitals taken for this visit.    Wt Readings from Last 3 Encounters:  12/03/22 283 lb (128.4 kg)  10/18/22 280 lb (127 kg)  10/13/22 287 lb (130.2 kg)     GEN: Well nourished, well developed in no acute distress CARDIAC: RRR, no murmurs, rubs, gallops RESPIRATORY:  Normal work  of breathing MUSCULOSKELETAL: no edema    ASSESSMENT & PLAN:    Atrial flutter: ECGs with documented flutter. S/p mapping and ablation of CTI-dependent atrial flutter.       - stop diltiazem and digoxin Secondary hypercoagulable state: Continue eliquis 35m PO BID CHF recovered EF: on farxiga 10, entresto 97/103, spironolactone 97/103, carvedilol 25        Medication Adjustments/Labs and Tests Ordered: Current medicines are reviewed at length with the patient today.  Concerns regarding medicines are outlined above.  No orders of the defined types were placed in this encounter.  No orders of the defined types were placed in this encounter.    Signed, AMelida Quitter MD  01/04/2023 12:04 PM    [place]

## 2023-01-04 NOTE — Patient Instructions (Signed)
Medication Instructions:  Your physician has recommended you make the following change in your medication:   1) STOP digoxin (Lanoxin) 2) STOP diltiazem (Cardizem)  *If you need a refill on your cardiac medications before your next appointment, please call your pharmacy*  Lab Work: None  Testing/Procedures: None  Follow-Up: At [facility], you and your health needs are our priority.  As part of our continuing mission to provide you with exceptional heart care, we have created designated Provider Care Teams.  These Care Teams include your primary Cardiologist (physician) and Advanced Practice Providers (APPs -  Physician Assistants and Nurse Practitioners) who all work together to provide you with the care you need, when you need it.  Your next appointment:   1 year(s)  Provider:   You may see Melida Quitter, MD or one of the following Advanced Practice Providers on your designated Care Team:   Tommye Standard, [place] [address]" [place], [place], NP

## 2023-02-02 ENCOUNTER — Telehealth: Payer: Self-pay | Admitting: Cardiovascular Disease

## 2023-02-02 NOTE — Telephone Encounter (Signed)
Pt c/o medication issue:  1. Name of Medication:   dapagliflozin propanediol (FARXIGA) 10 MG TABS tablet   2. How are you currently taking this medication (dosage and times per day)? As prescribed  3. Are you having a reaction (difficulty breathing--STAT)?   No  4. What is your medication issue?   Patient wants to know if he should continue taking this medication.

## 2023-02-02 NOTE — Telephone Encounter (Signed)
Pt aware he is to continue farxiga

## 2023-02-08 ENCOUNTER — Other Ambulatory Visit (HOSPITAL_COMMUNITY): Payer: Self-pay | Admitting: Internal Medicine

## 2023-03-02 ENCOUNTER — Other Ambulatory Visit (HOSPITAL_COMMUNITY): Payer: Self-pay | Admitting: Internal Medicine

## 2023-03-15 ENCOUNTER — Ambulatory Visit (HOSPITAL_BASED_OUTPATIENT_CLINIC_OR_DEPARTMENT_OTHER): Payer: Medicaid Other | Admitting: Cardiology

## 2023-03-21 ENCOUNTER — Other Ambulatory Visit (HOSPITAL_COMMUNITY): Payer: Self-pay | Admitting: Internal Medicine

## 2023-03-21 ENCOUNTER — Ambulatory Visit (HOSPITAL_BASED_OUTPATIENT_CLINIC_OR_DEPARTMENT_OTHER): Payer: Medicaid Other | Attending: Cardiology | Admitting: Cardiology

## 2023-04-06 ENCOUNTER — Other Ambulatory Visit (HOSPITAL_COMMUNITY): Payer: Self-pay | Admitting: Internal Medicine

## 2023-04-06 DIAGNOSIS — I5022 Chronic systolic (congestive) heart failure: Secondary | ICD-10-CM

## 2023-04-11 ENCOUNTER — Encounter: Payer: Self-pay | Admitting: *Deleted

## 2023-04-20 ENCOUNTER — Other Ambulatory Visit (HOSPITAL_COMMUNITY): Payer: Self-pay | Admitting: Internal Medicine

## 2023-04-20 DIAGNOSIS — I5022 Chronic systolic (congestive) heart failure: Secondary | ICD-10-CM

## 2023-05-03 ENCOUNTER — Other Ambulatory Visit (HOSPITAL_COMMUNITY): Payer: Self-pay | Admitting: Internal Medicine

## 2023-05-03 DIAGNOSIS — I5022 Chronic systolic (congestive) heart failure: Secondary | ICD-10-CM

## 2023-05-07 ENCOUNTER — Telehealth: Payer: Self-pay | Admitting: Internal Medicine

## 2023-05-07 DIAGNOSIS — I5022 Chronic systolic (congestive) heart failure: Secondary | ICD-10-CM

## 2023-05-07 MED ORDER — FUROSEMIDE 40 MG PO TABS: 40.0000 mg | ORAL_TABLET | ORAL | 0 refills | Status: DC | PRN

## 2023-05-07 NOTE — Telephone Encounter (Signed)
Received page from operator as patient was requesting a return phone call. I spoke with Mr. Stirewalt who explains that when he received his last refill of lasix, he was told he needs an appointment before he can get the refill following that. His last fill was from Dr. Teressa Lower on 06/04. Unfortunately when he was traveling last weekend he spilt a large portion of that prescription so is now nearly out of the medication. He says his weight today is 292 and it is normally around 282. He denies orthopnea, swelling, PND, but based on his weight is concerned that he is overloaded.  I will send in 30 tablets at this time. Patient voices agreement with and understanding to make an appointment with [facility]; I will reach out to the front desk and have them call him for this.  Champ Mungo, DO Internal Medicine PGY-2

## 2023-05-08 ENCOUNTER — Other Ambulatory Visit: Payer: Self-pay | Admitting: Student

## 2023-05-08 ENCOUNTER — Other Ambulatory Visit (HOSPITAL_COMMUNITY): Payer: Self-pay | Admitting: Internal Medicine

## 2023-05-22 ENCOUNTER — Other Ambulatory Visit: Payer: Self-pay | Admitting: Internal Medicine

## 2023-05-22 ENCOUNTER — Other Ambulatory Visit: Payer: Self-pay | Admitting: Student

## 2023-05-22 DIAGNOSIS — I5022 Chronic systolic (congestive) heart failure: Secondary | ICD-10-CM

## 2023-05-24 ENCOUNTER — Ambulatory Visit (INDEPENDENT_AMBULATORY_CARE_PROVIDER_SITE_OTHER): Payer: Medicaid Other | Admitting: Student

## 2023-05-24 DIAGNOSIS — I5082 Biventricular heart failure: Secondary | ICD-10-CM

## 2023-05-24 DIAGNOSIS — I5022 Chronic systolic (congestive) heart failure: Secondary | ICD-10-CM

## 2023-05-24 MED ORDER — CARVEDILOL 25 MG PO TABS: 25.0000 mg | ORAL_TABLET | Freq: Two times a day (BID) | ORAL | 10 refills | Status: DC

## 2023-05-24 MED ORDER — FUROSEMIDE 40 MG PO TABS: 40.0000 mg | ORAL_TABLET | ORAL | 0 refills | Status: DC | PRN

## 2023-05-24 NOTE — Telephone Encounter (Signed)
Patient called in requesting refills on lasix and carvedilol at [facility] on [address]. States he is out of both and is having swelling.

## 2023-05-24 NOTE — Assessment & Plan Note (Addendum)
Pt called today to refill his Lasix and Coreg. States that he is doing well overall but does have bilateral LE edema, which is typical for him. Denies acute weight gain. He is 290 today and was 292 last month. Also denied symptoms such as chest pain, SOB, orthopnea. Will refill these medicines but requested that he make appointment for next available with me, which he was agreeable to. Transportation had been a challenge for him but has since resolved. Discussed our willingness to arrange transportation as able if that should ever be an issue again. 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  PLAN: Medication refill but return ASAP for more appropriate evaluation of his chronic medical conditions, get appropriate labwork and exam.

## 2023-05-24 NOTE — Progress Notes (Signed)
   I connected with  Paul [name] on 05/24/23 by telephone and verified that I am speaking with the correct person using two identifiers.   I discussed the limitations of evaluation and management by telemedicine. The patient expressed understanding and agreed to proceed.  CC: Needs refill of CHF medicines  This is a telephone encounter between [facility] and Paul [name] on 05/24/2023 for Medication refill. The visit was conducted with the patient located at home and Paul [name] at [facility]. The patient's identity was confirmed using their DOB and current address. The patient has consented to being evaluated through a telephone encounter and understands the associated risks (an examination cannot be done and the patient may need to come in for an appointment) / benefits (allows the patient to remain at home, decreasing exposure to coronavirus). I personally spent 15 minutes on medical discussion.   HPI:  [name] is a 62 y.o. with PMH as below.   Please see A&P for assessment of the patient's acute and chronic medical conditions.   Past Medical History:  Diagnosis Date   CHF (congestive heart failure) (HCC)    Dyspnea    History of hip replacement    Hypertension    Review of Systems:  + for LE edema, - for chest pain, SOB, orthopnea, acute weight gain  Assessment & Plan:   Biventricular heart failure (HCC) Pt called today to refill his Lasix and Coreg. States that he is doing well overall but does have bilateral LE edema, which is typical for him. Denies acute weight gain. He is 290 today and was 292 last month. Also denied symptoms such as chest pain, SOB, orthopnea. Will refill these medicines but requested that he make appointment for next available with me, which he was agreeable to. Transportation had been a challenge for him but has since resolved. Discussed our willingness to arrange transportation as able if that should ever be an issue again. 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  PLAN: Medication refill but return ASAP for more appropriate evaluation of his chronic medical conditions, get appropriate labwork and exam.   RTC ASAP for in-person evaluation and labs.  Patient seen with Dr. Wandra Mannan D.O. [facility] Internal Medicine  PGY-1 Phone: [phone] Date 05/24/2023  Time 11:52 AM

## 2023-05-27 ENCOUNTER — Encounter: Payer: Self-pay | Admitting: Student

## 2023-05-27 ENCOUNTER — Ambulatory Visit (INDEPENDENT_AMBULATORY_CARE_PROVIDER_SITE_OTHER): Payer: Medicaid Other | Admitting: Student

## 2023-05-27 VITALS — BP 116/75 | HR 74 | Temp 98.2°F | Wt 290.9 lb

## 2023-05-27 DIAGNOSIS — Z87891 Personal history of nicotine dependence: Secondary | ICD-10-CM

## 2023-05-27 DIAGNOSIS — N1831 Chronic kidney disease, stage 3a: Secondary | ICD-10-CM

## 2023-05-27 DIAGNOSIS — I130 Hypertensive heart and chronic kidney disease with heart failure and stage 1 through stage 4 chronic kidney disease, or unspecified chronic kidney disease: Secondary | ICD-10-CM | POA: Diagnosis not present

## 2023-05-27 DIAGNOSIS — R7303 Prediabetes: Secondary | ICD-10-CM

## 2023-05-27 DIAGNOSIS — I4892 Unspecified atrial flutter: Secondary | ICD-10-CM | POA: Diagnosis not present

## 2023-05-27 DIAGNOSIS — N1832 Chronic kidney disease, stage 3b: Secondary | ICD-10-CM | POA: Diagnosis not present

## 2023-05-27 DIAGNOSIS — Z0000 Encounter for general adult medical examination without abnormal findings: Secondary | ICD-10-CM

## 2023-05-27 DIAGNOSIS — I5082 Biventricular heart failure: Secondary | ICD-10-CM | POA: Diagnosis not present

## 2023-05-27 DIAGNOSIS — I5022 Chronic systolic (congestive) heart failure: Secondary | ICD-10-CM

## 2023-05-27 DIAGNOSIS — I10 Essential (primary) hypertension: Secondary | ICD-10-CM

## 2023-05-27 MED ORDER — FUROSEMIDE 40 MG PO TABS
40.0000 mg | ORAL_TABLET | ORAL | 2 refills | Status: DC | PRN
Start: 2023-05-27 — End: 2023-09-03

## 2023-05-27 NOTE — Assessment & Plan Note (Addendum)
Pt sees cardiology. Most recent echo was in 2022 that showed EF 60-65% (improvement from 20% in 2021). Does have severely dilated LA and grade 1 diastolic dysfunction with moderate hypertrophy of LV (impaired relaxation). Remains of GDMT. He does have intermittent swelling of the legs, which Lasix controls. Dry weight approx 280, he is 290 today and has been for some time. Continue current regimen, Farxiga 10, entresto 97/103, spironolactone 97/103, carvedilol 25.

## 2023-05-27 NOTE — Assessment & Plan Note (Addendum)
RFs include HTN and heart disease, lasix and ibuprofen use, age. Baseline creatinine approx 1.65 over last two years GFR stage 3a, which is progression over time. Pt has been taking 40-80mg  lasix for diuresis and takes ibuprofen for hip pain. Discussed additional strategies to reduce leg swelling and promote diureses, also advised favoring tylenol over ibuprofen. Today - BMP, ACR. Monitor every 6 months, does not see a nephrologist, will refer if it becomes necessary in future. ADD: referral to nephrology placed given steady progression of CKD, now stage 3b

## 2023-05-27 NOTE — Assessment & Plan Note (Addendum)
Last colonoscopy 2012, no family history, referral placed to GI.

## 2023-05-27 NOTE — Assessment & Plan Note (Signed)
On antihypertensives as part of GDMT per heart failure - Coreg and Entresto. At goal today. Continue current regimen.

## 2023-05-27 NOTE — Patient Instructions (Signed)
Paul [name],  Thank you for coming in today. This after visit summary is an important review of tests, referrals, and medication changes that were discussed during your visit. If you have questions or concerns, call the clinic at [phone] between 9am - 4pm. Outside of clinic business hours, call the main hospital at [phone] and ask the operator for the on-call internal medicine resident.  Remember, use Tylenol instead of Ibuprofen as your primary pain killer. Only use Ibuprofen as "extra" on days that Tylenol doesn't do the job.   It was a great pleasure seeing you today, Best, Dr. [name]

## 2023-05-27 NOTE — Progress Notes (Addendum)
CC: General Check Up  HPI:  [name] is a 62 y.o. male living with a history stated below and presents today for Check up. Please see problem based assessment and plan for additional details.  Past Medical History:  Diagnosis Date   CHF (congestive heart failure) (HCC)    Dyspnea    History of hip replacement    Hypertension     Current Outpatient Medications on File Prior to Visit  Medication Sig Dispense Refill   atorvastatin (LIPITOR) 40 MG tablet TAKE 1 TABLET BY MOUTH EVERY DAY 90 tablet 3   carvedilol (COREG) 25 MG tablet Take 1 tablet (25 mg total) by mouth 2 (two) times daily. 60 tablet 10   ELIQUIS 5 MG TABS tablet TAKE 1 TABLET BY MOUTH TWICE A DAY 60 tablet 5   ENTRESTO 97-103 MG TAKE 1 TABLET BY MOUTH TWICE A DAY 60 tablet 11   FARXIGA 10 MG TABS tablet TAKE 1 TABLET (10 MG TOTAL) BY MOUTH DAILY. NEEDS FOLLOW UP APPOINTMENT FOR MORE REFILLS 30 tablet 0   spironolactone (ALDACTONE) 25 MG tablet TAKE 1 TABLET (25 MG TOTAL) BY MOUTH DAILY. 90 tablet 3   No current facility-administered medications on file prior to visit.    Family History  Problem Relation Age of Onset   Pulmonary Hypertension Mother    Hypertension Mother    CAD Father        s/p CABG x 4   Heart failure Father    Heart failure Sister    Hypertension Sister     Social History   Socioeconomic History   Marital status: Significant Other    Spouse name: Not on file   Number of children: Not on file   Years of education: Not on file   Highest education level: Not on file  Occupational History   Not on file  Tobacco Use   Smoking status: Never   Smokeless tobacco: Former    Types: Chew   Tobacco comments:    Former chew/dip 12/23/21  Vaping Use   Vaping status: Never Used  Substance and Sexual Activity   Alcohol use: Yes    Alcohol/week: 4.0 - 8.0 standard drinks of alcohol    Types: 4 - 8 Cans of beer per week    Comment: 1-2 beers every other day 12/23/21   Drug use: Not  Currently   Sexual activity: Not on file  Other Topics Concern   Not on file  Social History Narrative   Not on file   Social Determinants of Health   Financial Resource Strain: Not on file  Food Insecurity: No Food Insecurity (05/27/2023)   Hunger Vital Sign    Worried About Running Out of Food in the Last Year: Never true    Ran Out of Food in the Last Year: Never true  Transportation Needs: Not on file  Physical Activity: Not on file  Stress: Not on file  Social Connections: Not on file  Intimate Partner Violence: Not At Risk (05/27/2023)   Humiliation, Afraid, Rape, and Kick questionnaire    Fear of Current or Ex-Partner: No    Emotionally Abused: No    Physically Abused: No    Sexually Abused: No    Review of Systems: ROS negative except for what is noted on the assessment and plan.  Vitals:   05/27/23 0902  BP: 116/75  Pulse: 74  Temp: 98.2 F (36.8 C)  TempSrc: Oral  SpO2: 98%  Weight: 290 lb 14.4  oz (132 kg)    Physical Exam: Constitutional: well-appearing male sitting in chair, in no acute distress HENT: normocephalic atraumatic, mucous membranes moist Eyes: conjunctiva non-erythematous Cardiovascular: regular rate and rhythm, no m/r/g Pulmonary/Chest: normal work of breathing on room air, lungs clear to auscultation bilaterally Abdominal: soft, non-tender, non-distended MSK: normal bulk and tone, no edema Neurological: alert & oriented x 3, no focal deficit Skin: warm and dry Psych: normal mood and behavior  Assessment & Plan:    Patient seen with Dr. Sol Blazing  Prediabetes A1C was 6 in 2021. Not assessed since, sugars have crept up in recent labs. Will get A1C today.  Healthcare maintenance Last colonoscopy 2012, no family history, referral placed to GI.  Stage 3b chronic kidney disease (CKD) (HCC) RFs include HTN and heart disease, lasix and ibuprofen use, age. Baseline creatinine approx 1.65 over last two years GFR stage 3a, which is progression  over time. Pt has been taking 40-80mg  lasix for diuresis and takes ibuprofen for hip pain. Discussed additional strategies to reduce leg swelling and promote diureses, also advised favoring tylenol over ibuprofen. Today - BMP, ACR. Monitor every 6 months, does not see a nephrologist, will refer if it becomes necessary in future. ADD: referral to nephrology placed given steady progression of CKD, now stage 3b  Hypertension On antihypertensives as part of GDMT per heart failure - Coreg and Entresto. At goal today. Continue current regimen.  Biventricular heart failure (HCC) Pt sees cardiology. Most recent echo was in 2022 that showed EF 60-65% (improvement from 20% in 2021). Does have severely dilated LA and grade 1 diastolic dysfunction with moderate hypertrophy of LV (impaired relaxation). Remains of GDMT. He does have intermittent swelling of the legs, which Lasix controls. Dry weight approx 280, he is 290 today and has been for some time. Continue current regimen, Farxiga 10, entresto 97/103, spironolactone 97/103, carvedilol 25.  Atrial flutter [facility]) Sees cardiology. Underwent mapping and ablation and so is no longer on diltiazem, digoxin. Remains on eliquis 5mg  po BID.    [name], D.O. [facility] Internal Medicine, PGY-1 Phone: [phone] Date 06/01/2023 Time 2:51 PM

## 2023-05-27 NOTE — Assessment & Plan Note (Signed)
A1C was 6 in 2021. Not assessed since, sugars have crept up in recent labs. Will get A1C today.

## 2023-05-27 NOTE — Assessment & Plan Note (Signed)
Sees cardiology. Underwent mapping and ablation and so is no longer on diltiazem, digoxin. Remains on eliquis 5mg  po BID.

## 2023-05-28 LAB — MICROALBUMIN / CREATININE URINE RATIO
Creatinine, Urine: 89.8 mg/dL
Microalb/Creat Ratio: 4 mg/g creat (ref 0–29)
Microalbumin, Urine: 3.2 ug/mL

## 2023-05-28 LAB — BMP8+ANION GAP
Anion Gap: 16 mmol/L (ref 10.0–18.0)
BUN/Creatinine Ratio: 22 (ref 10–24)
BUN: 40 mg/dL — ABNORMAL HIGH (ref 8–27)
CO2: 27 mmol/L (ref 20–29)
Calcium: 9.3 mg/dL (ref 8.6–10.2)
Chloride: 94 mmol/L — ABNORMAL LOW (ref 96–106)
Creatinine, Ser: 1.79 mg/dL — ABNORMAL HIGH (ref 0.76–1.27)
Glucose: 97 mg/dL (ref 70–99)
Potassium: 4.4 mmol/L (ref 3.5–5.2)
Sodium: 137 mmol/L (ref 134–144)
eGFR: 42 mL/min/{1.73_m2} — ABNORMAL LOW (ref >59)

## 2023-05-28 LAB — LIPID PANEL
Chol/HDL Ratio: 2.6 ratio (ref 0.0–5.0)
Cholesterol, Total: 129 mg/dL (ref 100–199)
HDL: 49 mg/dL (ref >39)
LDL Chol Calc (NIH): 66 mg/dL (ref 0–99)
Triglycerides: 71 mg/dL (ref 0–149)
VLDL Cholesterol Cal: 14 mg/dL (ref 5–40)

## 2023-05-28 LAB — HEMOGLOBIN A1C
Est. average glucose Bld gHb Est-mCnc: 137 mg/dL
Hgb A1c MFr Bld: 6.4 % — ABNORMAL HIGH (ref 4.8–5.6)

## 2023-05-28 LAB — MAGNESIUM: Magnesium: 1.9 mg/dL (ref 1.6–2.3)

## 2023-05-28 NOTE — Addendum Note (Signed)
Addended by: Dickie La on: 05/28/2023 11:34 AM   Modules accepted: Level of Service

## 2023-05-28 NOTE — Progress Notes (Signed)
Internal Medicine Clinic Attending  I was physically present during the key portions of the resident provided service and participated in the medical decision making of patient's management care. I reviewed pertinent patient test results.  The assessment, diagnosis, and plan were formulated together and I agree with the documentation in the resident's note.  CKD progressing slowly over time. Now CKD3b range over the last 6 months. ACR today. He remains on ARB (within [place]), SGLT2i, MRA. We will refer to nephrology.  Dickie La, MD

## 2023-06-01 NOTE — Addendum Note (Signed)
Addended by: [name] on: 06/01/2023 02:51 PM   Modules accepted: Orders

## 2023-06-04 ENCOUNTER — Encounter: Payer: Medicaid Other | Admitting: Student

## 2023-06-10 NOTE — Progress Notes (Signed)
Internal Medicine Clinic Attending  Case discussed with the resident at the time of the visit.  We reviewed the resident's history and exam and pertinent patient test results.  I agree with the assessment, diagnosis, and plan of care documented in the resident's note.  

## 2023-07-27 ENCOUNTER — Encounter: Payer: Self-pay | Admitting: Gastroenterology

## 2023-09-01 ENCOUNTER — Other Ambulatory Visit: Payer: Self-pay | Admitting: Student

## 2023-09-01 DIAGNOSIS — I5022 Chronic systolic (congestive) heart failure: Secondary | ICD-10-CM

## 2023-09-02 ENCOUNTER — Telehealth (HOSPITAL_COMMUNITY): Payer: Self-pay | Admitting: Cardiology

## 2023-09-02 DIAGNOSIS — I5022 Chronic systolic (congestive) heart failure: Secondary | ICD-10-CM

## 2023-09-02 NOTE — Telephone Encounter (Signed)
Patient left VM asking for refill on furosemide. Says he is holding a lot of water. Weight is usually 281 lbs, he is now up to 290 lbs. Says he feels "heavy".    Returned call for additional details No answer left message on machine

## 2023-09-03 MED ORDER — POTASSIUM CHLORIDE CRYS ER 20 MEQ PO TBCR: 40.0000 meq | EXTENDED_RELEASE_TABLET | Freq: Every day | ORAL | 3 refills | Status: DC

## 2023-09-03 MED ORDER — FUROSEMIDE 40 MG PO TABS: ORAL_TABLET | ORAL | 2 refills | Status: DC

## 2023-09-03 NOTE — Telephone Encounter (Signed)
Pt aware Pt currently not taking potassium per [employer] kcl 40 daily Return for labs in one week

## 2023-09-03 NOTE — Telephone Encounter (Signed)
Currently taking 40 BID

## 2023-09-03 NOTE — Telephone Encounter (Signed)
LMOM

## 2023-09-10 ENCOUNTER — Other Ambulatory Visit: Payer: Self-pay | Admitting: Internal Medicine

## 2023-09-10 DIAGNOSIS — I5022 Chronic systolic (congestive) heart failure: Secondary | ICD-10-CM

## 2023-09-10 MED ORDER — FUROSEMIDE 40 MG PO TABS: ORAL_TABLET | ORAL | 2 refills | Status: DC

## 2023-09-16 ENCOUNTER — Other Ambulatory Visit: Payer: Self-pay | Admitting: Nephrology

## 2023-09-16 DIAGNOSIS — N1832 Chronic kidney disease, stage 3b: Secondary | ICD-10-CM

## 2023-09-16 LAB — LAB REPORT - SCANNED
Albumin, Urine POC: 7.2
Albumin/Creatinine Ratio, Urine, POC: 4
Creatinine, POC: 167.7 mg/dL
EGFR: 37

## 2023-09-20 ENCOUNTER — Other Ambulatory Visit (HOSPITAL_COMMUNITY): Payer: Self-pay | Admitting: Family Medicine

## 2023-09-20 ENCOUNTER — Other Ambulatory Visit: Payer: Self-pay | Admitting: Student

## 2023-09-20 DIAGNOSIS — I5022 Chronic systolic (congestive) heart failure: Secondary | ICD-10-CM

## 2023-09-21 ENCOUNTER — Inpatient Hospital Stay: Admission: RE | Admit: 2023-09-21 | Payer: Medicaid Other | Source: Ambulatory Visit

## 2023-09-23 ENCOUNTER — Encounter (HOSPITAL_COMMUNITY): Payer: Medicaid Other

## 2023-10-07 ENCOUNTER — Other Ambulatory Visit: Payer: Self-pay

## 2023-10-07 NOTE — Telephone Encounter (Signed)
Received a refill request from the pharmacy, patient is overdue for a check up visit. I called and lvm for the patient to give [place] a call back to schedule a appointment.

## 2023-10-20 ENCOUNTER — Ambulatory Visit: Payer: Medicaid Other | Admitting: Gastroenterology

## 2023-10-20 NOTE — Progress Notes (Unsigned)
HPI : Paul [name] is a 62 y.o. male with a history of advanced heart failure with recovery of EF, history of ventricular thrombus and a flutter status post ablation who is referred to [place] by Reymundo Poll, MD for colon cancer screening.  His last colonoscopy was reportedly in 2011 or 2012 (unable to locate procedure report).  His most recent echocardiogram was in 2022 and showed a normal EF (60-65%).  Following his ablation in January 2024, his cardiologist discontinued his digoxin and diltiazem.    Past Medical History:  Diagnosis Date   CHF (congestive heart failure) (HCC)    Dyspnea    History of hip replacement    Hypertension      Past Surgical History:  Procedure Laterality Date   A-FLUTTER ABLATION N/A 12/03/2022   Procedure: A-FLUTTER ABLATION;  Surgeon: Mealor, Roberts Gaudy, MD;  Location: [facility] INVASIVE CV [facility];  Service: Cardiovascular;  Laterality: N/A;   CARDIOVERSION N/A 01/08/2022   Procedure: CARDIOVERSION;  Surgeon: Jodelle Red, MD;  Location: [facility] ENDOSCOPY;  Service: Cardiovascular;  Laterality: N/A;   CARDIOVERSION N/A 04/30/2022   Procedure: CARDIOVERSION;  Surgeon: Vesta Mixer, MD;  Location: [facility] ENDOSCOPY;  Service: Cardiovascular;  Laterality: N/A;   CARDIOVERSION N/A 09/15/2022   Procedure: CARDIOVERSION;  Surgeon: Quintella Reichert, MD;  Location: [facility] ENDOSCOPY;  Service: Cardiovascular;  Laterality: N/A;   RIGHT/LEFT HEART CATH AND CORONARY ANGIOGRAPHY N/A 10/17/2020   Procedure: RIGHT/LEFT HEART CATH AND CORONARY ANGIOGRAPHY;  Surgeon: Dolores Patty, MD;  Location: [facility] INVASIVE CV LAB;  Service: Cardiovascular;  Laterality: N/A;   TOTAL HIP ARTHROPLASTY     Family History  Problem Relation Age of Onset   Pulmonary Hypertension Mother    Hypertension Mother    CAD Father        s/p CABG x 4   Heart failure Father    Heart failure Sister    Hypertension Sister    Social History   Tobacco Use   Smoking status: Never   Smokeless  tobacco: Former    Types: Chew   Tobacco comments:    Former chew/dip 12/23/21  Vaping Use   Vaping status: Never Used  Substance Use Topics   Alcohol use: Yes    Alcohol/week: 4.0 - 8.0 standard drinks of alcohol    Types: 4 - 8 Cans of beer per week    Comment: 1-2 beers every other day 12/23/21   Drug use: Not Currently   Current Outpatient Medications  Medication Sig Dispense Refill   atorvastatin (LIPITOR) 40 MG tablet TAKE 1 TABLET BY MOUTH EVERY DAY 90 tablet 3   carvedilol (COREG) 25 MG tablet Take 1 tablet (25 mg total) by mouth 2 (two) times daily. 60 tablet 10   ELIQUIS 5 MG TABS tablet TAKE 1 TABLET BY MOUTH TWICE A DAY 60 tablet 5   ENTRESTO 97-103 MG TAKE 1 TABLET BY MOUTH TWICE A DAY 60 tablet 11   FARXIGA 10 MG TABS tablet TAKE 1 TABLET (10 MG TOTAL) BY MOUTH DAILY. NEEDS FOLLOW UP APPOINTMENT FOR MORE REFILLS 30 tablet 0   furosemide (LASIX) 40 MG tablet Take 2 tablets (80 mg total) by mouth 2 (two) times daily for 3 days, THEN 1 tablet (40 mg total) 2 (two) times daily. 66 tablet 2   potassium chloride SA (KLOR-CON M) 20 MEQ tablet Take 2 tablets (40 mEq total) by mouth daily. 60 tablet 3   spironolactone (ALDACTONE) 25 MG tablet TAKE 1 TABLET (25 MG TOTAL)  BY MOUTH DAILY. 90 tablet 3   No current facility-administered medications for this visit.   Allergies  Allergen Reactions   Shellfish Allergy Anaphylaxis, Shortness Of Breath and Swelling    Throat swelling     Review of Systems: All systems reviewed and negative except where noted in HPI.    No results found.  Physical Exam: There were no vitals taken for this visit. Constitutional: Pleasant,well-developed, ***male in no acute distress. HEENT: Normocephalic and atraumatic. Conjunctivae are normal. No scleral icterus. Neck supple.  Cardiovascular: Normal rate, regular rhythm.  Pulmonary/chest: Effort normal and breath sounds normal. No wheezing, rales or rhonchi. Abdominal: Soft, nondistended,  nontender. Bowel sounds active throughout. There are no masses palpable. No hepatomegaly. Extremities: no edema Lymphadenopathy: No cervical adenopathy noted. Neurological: Alert and oriented to person place and time. Skin: Skin is warm and dry. No rashes noted. Psychiatric: Normal mood and affect. Behavior is normal.  CBC    Component Value Date/Time   WBC 5.8 11/25/2022 1017   WBC 4.4 09/08/2022 1051   RBC 4.92 11/25/2022 1017   RBC 5.00 09/08/2022 1051   HGB 13.4 11/25/2022 1017   HCT 40.2 11/25/2022 1017   PLT 288 11/25/2022 1017   MCV 82 11/25/2022 1017   MCH 27.2 11/25/2022 1017   MCH 27.2 09/08/2022 1051   MCHC 33.3 11/25/2022 1017   MCHC 31.9 09/08/2022 1051   RDW 17.4 (H) 11/25/2022 1017   LYMPHSABS 2.4 11/25/2022 1017   MONOABS 1.1 (H) 10/21/2020 0356   EOSABS 0.2 11/25/2022 1017   BASOSABS 0.0 11/25/2022 1017    CMP     Component Value Date/Time   NA 137 05/27/2023 1004   K 4.4 05/27/2023 1004   CL 94 (L) 05/27/2023 1004   CO2 27 05/27/2023 1004   GLUCOSE 97 05/27/2023 1004   GLUCOSE 104 (H) 09/08/2022 1051   BUN 40 (H) 05/27/2023 1004   CREATININE 1.79 (H) 05/27/2023 1004   CALCIUM 9.3 05/27/2023 1004   PROT 7.9 10/28/2020 1407   ALBUMIN 4.0 10/28/2020 1407   AST 32 10/28/2020 1407   ALT 56 (H) 10/28/2020 1407   ALKPHOS 99 10/28/2020 1407   BILITOT 0.3 10/28/2020 1407   GFRNONAA 48 (L) 09/08/2022 1051   GFRAA 72 11/28/2020 1503       Latest Ref Rng & Units 11/25/2022   10:17 AM 09/08/2022   10:51 AM 04/22/2022   12:40 PM  CBC EXTENDED  WBC 3.4 - 10.8 x10E3/uL 5.8  4.4  4.6   RBC 4.14 - 5.80 x10E6/uL 4.92  5.00  5.23   Hemoglobin 13.0 - 17.7 g/dL 86.5  78.4  69.6   HCT 37.5 - 51.0 % 40.2  42.7  43.4   Platelets 150 - 450 x10E3/uL 288  261  265   NEUT# 1.4 - 7.0 x10E3/uL 2.4     Lymph# 0.7 - 3.1 x10E3/uL 2.4         ASSESSMENT AND PLAN:  Reymundo Poll, MD

## 2023-10-21 ENCOUNTER — Other Ambulatory Visit: Payer: Self-pay | Admitting: Student

## 2023-10-21 ENCOUNTER — Other Ambulatory Visit (HOSPITAL_COMMUNITY): Payer: Self-pay | Admitting: Family Medicine

## 2023-10-21 DIAGNOSIS — I5022 Chronic systolic (congestive) heart failure: Secondary | ICD-10-CM

## 2023-10-21 NOTE — Telephone Encounter (Signed)
Lasix 1 tablet (40 mg total) 2 (two) times daily on current med list.

## 2023-10-25 ENCOUNTER — Telehealth: Payer: Self-pay | Admitting: *Deleted

## 2023-10-25 MED ORDER — FUROSEMIDE 40 MG PO TABS: ORAL_TABLET | ORAL | 5 refills | Status: DC

## 2023-10-25 NOTE — Telephone Encounter (Signed)
Call from patient has a 16/b weight gain.  Stated has been without his Lasix since Thursday.  Spoke with Dr. Ninfa Meeker sent in refill for the Lasux.  Patient to take an extra dose today and to call if no relief or develops shortness of breath within 24 to 48 hours.

## 2023-11-15 ENCOUNTER — Other Ambulatory Visit: Payer: Self-pay

## 2023-11-15 MED ORDER — SPIRONOLACTONE 25 MG PO TABS: 25.0000 mg | ORAL_TABLET | Freq: Every day | ORAL | 3 refills | Status: DC

## 2023-11-25 ENCOUNTER — Encounter: Payer: Self-pay | Admitting: Internal Medicine

## 2023-11-25 ENCOUNTER — Other Ambulatory Visit: Payer: Self-pay

## 2023-11-25 ENCOUNTER — Ambulatory Visit: Payer: Medicaid Other | Admitting: Internal Medicine

## 2023-11-25 VITALS — BP 129/69 | HR 74 | Temp 99.0°F | Ht 75.0 in | Wt 301.2 lb

## 2023-11-25 DIAGNOSIS — I5082 Biventricular heart failure: Secondary | ICD-10-CM

## 2023-11-25 DIAGNOSIS — I129 Hypertensive chronic kidney disease with stage 1 through stage 4 chronic kidney disease, or unspecified chronic kidney disease: Secondary | ICD-10-CM

## 2023-11-25 DIAGNOSIS — M722 Plantar fascial fibromatosis: Secondary | ICD-10-CM

## 2023-11-25 DIAGNOSIS — N1832 Chronic kidney disease, stage 3b: Secondary | ICD-10-CM | POA: Diagnosis not present

## 2023-11-25 DIAGNOSIS — N1831 Chronic kidney disease, stage 3a: Secondary | ICD-10-CM

## 2023-11-25 DIAGNOSIS — Z1211 Encounter for screening for malignant neoplasm of colon: Secondary | ICD-10-CM | POA: Insufficient documentation

## 2023-11-25 NOTE — Assessment & Plan Note (Signed)
Referral to GI for colonoscopy sent ?

## 2023-11-25 NOTE — Patient Instructions (Signed)
 Paul [name],   It was a pleasure meeting you today.   For your foot pain, I suspect you have plantar fasciitis. I recommend icing the bottom of your foot with a cold can or golf ball. You can also wear shoes with good arch support. Please try to avoid ibuprofen or other NSAIDs. If you continue to have worsening pain, we can refer you to podiatry for a steroid injection at a future visit.   Please make an appointment with your cardiologist for a routine follow up. I recommend recording your weight every day at the same time.   I have sent a referral for a colonoscopy.   I will call you with any abnormal results of the bloodwork we do today.   Thanks,  Dr Francella

## 2023-11-25 NOTE — Assessment & Plan Note (Signed)
 Patient reports pain in the sole of his foot with the first step out of bed every morning. His pain has been going on for several weeks. He tries not to take NSAIDs as chronic NSAID use has contributed to his CKD. He additionally stepped wrong and twisted his ankle some time ago, which he is managing with Tylenol  and ice. Discussed the pain in his sole is likely due to plantar fasciitis, and conservative management aside from NSAIDs includes stretches, icing with cold can or golf ball, and arch support. If pain does not improve/worsens, we can refer him to podiatry for a steroid injection at a future visit.

## 2023-11-25 NOTE — Assessment & Plan Note (Signed)
 BP today is 129/69. Curreng regimen includes Coreg 25, Aldactone 25, Entresto. No changes to regimen today

## 2023-11-25 NOTE — Assessment & Plan Note (Signed)
 BMP today

## 2023-11-25 NOTE — Progress Notes (Signed)
 Subjective:  CC: routine visit  HPI:  [name] is a 63 y.o. male with a past medical history stated below and presents today for above. Please see problem based assessment and plan for additional details.  Past Medical History:  Diagnosis Date   CHF (congestive heart failure) (HCC)    Dyspnea    History of hip replacement    Hypertension     Current Outpatient Medications on File Prior to Visit  Medication Sig Dispense Refill   atorvastatin  (LIPITOR) 40 MG tablet TAKE 1 TABLET BY MOUTH EVERY DAY 90 tablet 3   carvedilol  (COREG ) 25 MG tablet Take 1 tablet (25 mg total) by mouth 2 (two) times daily. 60 tablet 10   ELIQUIS  5 MG TABS tablet TAKE 1 TABLET BY MOUTH TWICE A DAY 60 tablet 5   ENTRESTO  97-103 MG TAKE 1 TABLET BY MOUTH TWICE A DAY 60 tablet 11   FARXIGA  10 MG TABS tablet TAKE 1 TABLET (10 MG TOTAL) BY MOUTH DAILY. NEEDS FOLLOW UP APPOINTMENT FOR MORE REFILLS 30 tablet 0   furosemide  (LASIX ) 40 MG tablet Take 2 tablets (80 mg total) by mouth 2 (two) times daily for 3 days, THEN 1 tablet (40 mg total) 2 (two) times daily. 66 tablet 5   potassium chloride  SA (KLOR-CON  M20) 20 MEQ tablet TAKE 2 TABLETS BY MOUTH DAILY 60 tablet 0   spironolactone  (ALDACTONE ) 25 MG tablet Take 1 tablet (25 mg total) by mouth daily. 90 tablet 3   No current facility-administered medications on file prior to visit.   Review of Systems: ROS negative except for as is noted on the assessment and plan.  Objective:   Vitals:   11/25/23 1440  BP: 129/69  Pulse: 74  Temp: 99 F (37.2 C)  TempSrc: Oral  SpO2: 100%  Weight: (!) 301 lb 3.2 oz (136.6 kg)  Height: 6' 3 (1.905 m)   Physical Exam: Constitutional: well-appearing, in no acute distress Cardiovascular: regular rate and rhythm Pulmonary/Chest: normal work of breathing on room air MSK: normal bulk and tone  Assessment & Plan:   Plantar fasciitis of left foot Patient reports pain in the sole of his foot with the first  step out of bed every morning. His pain has been going on for several weeks. He tries not to take NSAIDs as chronic NSAID use has contributed to his CKD. He additionally stepped wrong and twisted his ankle some time ago, which he is managing with Tylenol  and ice. Discussed the pain in his sole is likely due to plantar fasciitis, and conservative management aside from NSAIDs includes stretches, icing with cold can or golf ball, and arch support. If pain does not improve/worsens, we can refer him to podiatry for a steroid injection at a future visit.   Hypertension BP today is 129/69. Curreng regimen includes Coreg  25, Aldactone  25, Entresto . No changes to regimen today  Stage 3b chronic kidney disease (CKD) (HCC) BMP today  Biventricular heart failure (HCC) Followed by cardiology for heart failure, last seen approximately 1 year ago. Regimen includes Entresto , Farxiga  10mg , Lasix  80mg  BID, Aldactone  25mg . No lower extremity edema or shortness of breath today. His weight is 301lb today, dry weight noted at 280lb. He attributes this increase to wearing his heavy jacket and boots when being weighed today. Patient appears euvolemic today, no concern for HF exacerbation. Recommended making an appointment with cardiology soon. Also recommended recording his daily weight to help track volume status.   Colon cancer screening  Referral to GI for colonoscopy sent.     Patient discussed with Dr. Lovie Redell Burnet MD [facility] Internal [facility]  PGY-1 Pager: [phone] Date 11/25/2023  Time 3:35 PM

## 2023-11-25 NOTE — Assessment & Plan Note (Signed)
 Followed by cardiology for heart failure, last seen approximately 1 year ago. Regimen includes Entresto , Farxiga  10mg , Lasix  80mg  BID, Aldactone  25mg . No lower extremity edema or shortness of breath today. His weight is 301lb today, dry weight noted at 280lb. He attributes this increase to wearing his heavy jacket and boots when being weighed today. Patient appears euvolemic today, no concern for HF exacerbation. Recommended making an appointment with cardiology soon. Also recommended recording his daily weight to help track volume status.

## 2023-11-26 LAB — BASIC METABOLIC PANEL
BUN/Creatinine Ratio: 24 (ref 10–24)
BUN: 32 mg/dL — ABNORMAL HIGH (ref 8–27)
CO2: 28 mmol/L (ref 20–29)
Calcium: 9.3 mg/dL (ref 8.6–10.2)
Chloride: 94 mmol/L — ABNORMAL LOW (ref 96–106)
Creatinine, Ser: 1.32 mg/dL — ABNORMAL HIGH (ref 0.76–1.27)
Glucose: 92 mg/dL (ref 70–99)
Potassium: 4.1 mmol/L (ref 3.5–5.2)
Sodium: 138 mmol/L (ref 134–144)
eGFR: 61 mL/min/{1.73_m2} (ref >59)

## 2023-11-26 NOTE — Progress Notes (Signed)
 Internal Medicine Clinic Attending  Case discussed with the resident at the time of the visit.  We reviewed the resident's history and exam and pertinent patient test results.  I agree with the assessment, diagnosis, and plan of care documented in the resident's note.

## 2023-11-30 ENCOUNTER — Other Ambulatory Visit: Payer: Self-pay

## 2023-12-02 MED ORDER — ENTRESTO 97-103 MG PO TABS: 1.0000 | ORAL_TABLET | Freq: Two times a day (BID) | ORAL | 11 refills | Status: DC

## 2023-12-22 ENCOUNTER — Other Ambulatory Visit (HOSPITAL_COMMUNITY): Payer: Self-pay | Admitting: Internal Medicine

## 2024-01-03 ENCOUNTER — Other Ambulatory Visit: Payer: Self-pay

## 2024-01-03 MED ORDER — ATORVASTATIN CALCIUM 40 MG PO TABS: 40.0000 mg | ORAL_TABLET | Freq: Every day | ORAL | 3 refills | Status: DC

## 2024-01-03 NOTE — Telephone Encounter (Signed)
 Medication sent to pharmacy

## 2024-01-06 ENCOUNTER — Other Ambulatory Visit (HOSPITAL_COMMUNITY): Payer: Self-pay | Admitting: Internal Medicine

## 2024-01-10 LAB — LAB REPORT - SCANNED: EGFR: 63

## 2024-01-12 ENCOUNTER — Other Ambulatory Visit: Payer: Self-pay

## 2024-01-12 MED ORDER — APIXABAN 5 MG PO TABS: 5.0000 mg | ORAL_TABLET | Freq: Two times a day (BID) | ORAL | 5 refills | Status: DC

## 2024-01-14 ENCOUNTER — Other Ambulatory Visit: Payer: Self-pay | Admitting: Nephrology

## 2024-01-14 DIAGNOSIS — N1832 Chronic kidney disease, stage 3b: Secondary | ICD-10-CM

## 2024-02-23 ENCOUNTER — Other Ambulatory Visit: Payer: Self-pay | Admitting: Student

## 2024-02-23 DIAGNOSIS — I5022 Chronic systolic (congestive) heart failure: Secondary | ICD-10-CM

## 2024-04-13 ENCOUNTER — Other Ambulatory Visit (HOSPITAL_COMMUNITY): Payer: Self-pay | Admitting: Internal Medicine

## 2024-04-20 ENCOUNTER — Telehealth (HOSPITAL_COMMUNITY): Payer: Self-pay

## 2024-04-20 NOTE — Telephone Encounter (Signed)
 Called to confirm/remind patient of their appointment at the [facility] on 04/21/24.   Appointment:   [] Confirmed  [x] Left mess   [] No answer/No voice mail  [] VM Full/unable to leave message  [] Phone not in service  And to bring in all medications and/or complete list.

## 2024-04-21 ENCOUNTER — Encounter (HOSPITAL_COMMUNITY)

## 2024-04-21 NOTE — Progress Notes (Incomplete)
 [facility] NOTE  PCP: Jayson Michael, MD HF Cardiologist: Dr. Julane Ny  HPI:  Paul [name] is a 63 y.o.male with obesity, HTN and ETOH use. Admitted in 11/21 with new onset HF.    Echo biventricular heart failure. LVEF 20-25% w/ global hypokinesis, mild LVH. RV systolic function moderately reduced. There is an LV apical thrombus measuring 2.4 cm x 1.3 cm.   - cMRI Severe LVE EF 22%. 1 cm x 1.4 cm mobile apical thrombus. Mild RVE with moderate reduction in RVEF 31%. Combination of mid/basal hypertrophy poor nulling of myocardium, elevated ECV and mildly elevated T1 suggests possibility of infiltrative cardiomyopathy.   - PYP 12/21 negative  - LHC 10/17/20  Prox LAD to Mid LAD lesion is 30% stenosed. Mid LAD lesion is 30% stenosed. Dist LAD lesion is 30% stenosed. Apical LAD with diffuse disease 1st Diag lesion is 70% stenosed  Developed NSVT and LifeVest placed.  Echo 2/22 EF 50% RV normal.   Today he returns for HF follow up. Says he feels fatigued and stressed out with family issues. No SOB, orthopnea or edema. Does a "little walking" as his hips will allow. SBP at home remains 110-120/60s. Mildly dizzy when standing. Taking lasix  40 bid.    Echo today 09/16/21 EF 55-60% mod LVH G1 DD Personally reviewed  ROS: All systems negative except as listed in HPI, PMH and Problem List.  SH:  Social History   Socioeconomic History   Marital status: Significant Other    Spouse name: Not on file   Number of children: Not on file   Years of education: Not on file   Highest education level: Not on file  Occupational History   Not on file  Tobacco Use   Smoking status: Never   Smokeless tobacco: Former    Types: Chew   Tobacco comments:    Former chew/dip 12/23/21  Vaping Use   Vaping status: Never Used  Substance and Sexual Activity   Alcohol use: Yes    Alcohol/week: 4.0 - 8.0 standard drinks of alcohol    Types: 4 - 8 Cans of beer per week    Comment:  1-2 beers every other day 12/23/21   Drug use: Not Currently   Sexual activity: Not on file  Other Topics Concern   Not on file  Social History Narrative   Not on file   Social Drivers of Health   Financial Resource Strain: Not on file  Food Insecurity: No Food Insecurity (05/27/2023)   Hunger Vital Sign    Worried About Running Out of Food in the Last Year: Never true    Ran Out of Food in the Last Year: Never true  Transportation Needs: Not on file  Physical Activity: Not on file  Stress: Not on file  Social Connections: Not on file  Intimate Partner Violence: Not At Risk (05/27/2023)   Humiliation, Afraid, Rape, and Kick questionnaire    Fear of Current or Ex-Partner: No    Emotionally Abused: No    Physically Abused: No    Sexually Abused: No   FH:  Family History  Problem Relation Age of Onset   Pulmonary Hypertension Mother    Hypertension Mother    CAD Father        s/p CABG x 4   Heart failure Father    Heart failure Sister    Hypertension Sister    Past Medical History:  Diagnosis Date   CHF (congestive heart failure) (HCC)  Dyspnea    History of hip replacement    Hypertension    Current Outpatient Medications  Medication Sig Dispense Refill   apixaban  (ELIQUIS ) 5 MG TABS tablet Take 1 tablet (5 mg total) by mouth 2 (two) times daily. 60 tablet 5   atorvastatin  (LIPITOR) 40 MG tablet Take 1 tablet (40 mg total) by mouth daily. 90 tablet 3   carvedilol  (COREG ) 25 MG tablet Take 1 tablet (25 mg total) by mouth 2 (two) times daily. 60 tablet 10   FARXIGA  10 MG TABS tablet TAKE 1 TABLET (10 MG TOTAL) BY MOUTH DAILY. NEEDS FOLLOW UP APPOINTMENT FOR MORE REFILLS 30 tablet 0   furosemide  (LASIX ) 40 MG tablet TAKE 2 TABLETS BY MOUTH 2 (TWO) TIMES DAILY FOR 3 DAYS, THEN 1 TABLET (40 MG TOTAL) 2 (TWO) TIMES DAILY. 180 tablet 2   potassium chloride  SA (KLOR-CON  M) 20 MEQ tablet TAKE 2 TABLETS (40 MEQ TOTAL) BY MOUTH DAILY. NEEDS FOLLOW UP APPOINTMENT FOR MORE REFILLS  60 tablet 0   sacubitril -valsartan  (ENTRESTO ) 97-103 MG Take 1 tablet by mouth 2 (two) times daily. 60 tablet 11   spironolactone  (ALDACTONE ) 25 MG tablet Take 1 tablet (25 mg total) by mouth daily. 90 tablet 3   No current facility-administered medications for this visit.   There were no vitals taken for this visit.  Wt Readings from Last 3 Encounters:  11/25/23 (!) 136.6 kg (301 lb 3.2 oz)  05/27/23 132 kg (290 lb 14.4 oz)  01/04/23 129.3 kg (285 lb)   PHYSICAL EXAM: General:  Well appearing. No resp difficulty HEENT: normal Neck: supple. no JVD. Carotids 2+ bilat; no bruits. No lymphadenopathy or thryomegaly appreciated. Cor: PMI nondisplaced. Regular rate & rhythm. No rubs, gallops or murmurs. Lungs: clear Abdomen: obese  soft, nontender, nondistended. No hepatosplenomegaly. No bruits or masses. Good bowel sounds. Extremities: no cyanosis, clubbing, rash, edema Neuro: alert & orientedx3, cranial nerves grossly intact. moves all 4 extremities w/o difficulty. Affect pleasant   ASSESSMENT & PLAN:  1. Chronic Systolic Heart Failure - Admitted (11/21) with acute systolic HF. Markedly hypertensive in ED. HS trop 113>>93. EKG sinus tach w/ PACs - Echo (11/21): LVEF 20-25% w/ global HK, mild LVH and moderately reduced RV (no prior study for comparison)  - cMRI (12/21): Severe LVE EF 22%. 1 cm x 1.4 cm mobile apical thrombus. Mild RVE with moderate reduction in RVEF 31%. Combination of mid/basal hypertrophy poor nulling of myocardium, elevated ECV and mildly elevated T1 suggests possibility of infiltrative cardiomyopathy.  - PYP (12/21) negative. - Cath with mild CAD and elevated filling pressures. - Echo (2/22): EF 45-50% RV normal. - Echo today 09/16/21 EF 55-60% mod LVH G1 DD Personally reviewed - NYHA I-II - Taking lasix  40 bid. Has orthostatic symptoms. Told him to cut lasix  back a bit - Continue carvedilol  18.75 mg bid. - Continue Entresto  97-103 mg bid. - Continue Farxiga  10 mg  daily. - Continue spiro 25 mg daily.   - Labs today  2. CAD, mild non-obstructive - LHC 10/17/20  Prox LAD to Mid LAD lesion is 30% stenosed. Mid LAD lesion is 30% stenosed. Dist LAD lesion is 30% stenosed. Apical LAD with diffuse disease 1st Diag lesion is 70% stenosed.  - No s/s angina - Continue statin + ASA 81.   3. LV Thrombus  - Resolved. Off Eliquis .    4. Hypertension  - Well-controlled. - Has some orthostatic symptoms. Cut back lasix  to 40 daily. Take extra as needed  5. ETOH Abuse  - Still drinking occasionally  6. Probable OSA - Says he is snoring less. Not interested in sleep study    Elmarie Hacking, FNP  04/21/24

## 2024-05-17 ENCOUNTER — Other Ambulatory Visit (HOSPITAL_COMMUNITY): Payer: Self-pay | Admitting: Internal Medicine

## 2024-06-20 ENCOUNTER — Other Ambulatory Visit: Payer: Self-pay

## 2024-06-20 DIAGNOSIS — I5022 Chronic systolic (congestive) heart failure: Secondary | ICD-10-CM

## 2024-06-21 ENCOUNTER — Other Ambulatory Visit: Payer: Self-pay

## 2024-06-21 ENCOUNTER — Ambulatory Visit

## 2024-06-21 ENCOUNTER — Ambulatory Visit: Payer: Self-pay

## 2024-06-21 ENCOUNTER — Telehealth: Payer: Self-pay | Admitting: *Deleted

## 2024-06-21 VITALS — BP 136/88 | HR 72 | Wt 312.1 lb

## 2024-06-21 DIAGNOSIS — N1832 Chronic kidney disease, stage 3b: Secondary | ICD-10-CM

## 2024-06-21 DIAGNOSIS — I4892 Unspecified atrial flutter: Secondary | ICD-10-CM

## 2024-06-21 DIAGNOSIS — E785 Hyperlipidemia, unspecified: Secondary | ICD-10-CM

## 2024-06-21 DIAGNOSIS — I5082 Biventricular heart failure: Secondary | ICD-10-CM

## 2024-06-21 DIAGNOSIS — I10 Essential (primary) hypertension: Secondary | ICD-10-CM

## 2024-06-21 DIAGNOSIS — I5022 Chronic systolic (congestive) heart failure: Secondary | ICD-10-CM

## 2024-06-21 DIAGNOSIS — I129 Hypertensive chronic kidney disease with stage 1 through stage 4 chronic kidney disease, or unspecified chronic kidney disease: Secondary | ICD-10-CM

## 2024-06-21 DIAGNOSIS — R7303 Prediabetes: Secondary | ICD-10-CM | POA: Diagnosis not present

## 2024-06-21 MED ORDER — TORSEMIDE 40 MG PO TABS
1.0000 | ORAL_TABLET | Freq: Two times a day (BID) | ORAL | 2 refills | Status: DC | PRN
Start: 2024-06-21 — End: 2024-06-22

## 2024-06-21 MED ORDER — DAPAGLIFLOZIN PROPANEDIOL 5 MG PO TABS: 5.0000 mg | ORAL_TABLET | Freq: Every day | ORAL | 3 refills | Status: AC

## 2024-06-21 NOTE — Assessment & Plan Note (Signed)
 Followed by cardiology who the patient last saw more than a year ago.  His last echo was on 11/22 that showed an EF of 60 to 65%.  Regimen for heart failure includes Entresto , Farxiga , Lasix , Aldactone .  Patient mentioned that he was told to stop taking Farxiga .  However, there has been a miscommunication per charting which shows that patient needs to be on Farxiga  in some documentations and the opposite in another.  We believe patient will benefit from Farxiga  for his biventricular heart failure and also his CKD.  Started patient back on Farxiga .  Patient has distended abdomen and some swelling in his bilateral lower extremities.  Furosemide  2 times a day does not seem to help him with diuresis.  We will discontinue furosemide  and start with torsemide  daily.  Asked patient to take 1 tablet of torsemide  every morning.  Asked patient to add another tablet of torsemide  in the evening as needed. --Continue Entresto  97-103 mg twice daily --Continue Aldactone  25 mg daily --Start Farxiga  5 mg daily --Start torsemide  40 mg tablet: 1 tablet in a.m. daily; another tablet in p.m. as needed --Discontinue Lasix  --Will get BMP today --Follow-up in 2 weeks

## 2024-06-21 NOTE — Telephone Encounter (Signed)
 Copied from CRM #[id number]. Topic: Clinical - Medication Refill >> Jun 21, 2024  8:55 AM Zane F wrote: Patient is calling in stating that he is retaining a good deal of fluid and his weight has jumped up to 300 lbs. The patient stated that he is only being given 30 tablets each time he goes to refill despite his prescription listing the quantity of 180 tablets. Patient is in a good deal of pain. Patient has been out of the prescription for two weeks.   Medication: furosemide  (LASIX ) 40 MG tablet  Has the patient contacted their pharmacy? Yes  This is the patient's preferred pharmacy:   [facility] [address] [place] - [address] [place] [address] Phone: [phone] Fax: [phone]  Is this the correct pharmacy for this prescription? Yes   Has the prescription been filled recently? No  Is the patient out of the medication? Yes  Has the patient been seen for an appointment in the last year OR does the patient have an upcoming appointment? Yes  Can we respond through MyChart? Yes  Agent: Please be advised that Rx refills may take up to 3 business days. We ask that you follow-up with your pharmacy.

## 2024-06-21 NOTE — Telephone Encounter (Signed)
 RTC to patient message was left that the doctors feel that he needs to come in for an appointment on today.  Message was left that we have available appointments now in the Clinics and to give us  a call to schedule an appointment to come in on today.

## 2024-06-21 NOTE — Telephone Encounter (Signed)
 Reviewed chart with Paul [name], last seen in jan, has biventricular heart failure. With his weight gain I really want him seen in person, to check weight, vitals, and physical exam to adjust meds.

## 2024-06-21 NOTE — Telephone Encounter (Signed)
 Have spoken to patient and Attending.  RTC from patient will come in at 2:15 this afternoon. Copied from CRM #[id number]. Topic: Clinical - Prescription Issue >> Jun 21, 2024  9:26 AM Farrel B wrote: Reason for CRM: [id number]:patient called on the [facility] stating that he was confused on this dosages for the medication: furosemide  (LASIX ) 40 MG tablet, he stated that he is supposed to get 180 which is 60 pills for 3 months but he's only been getting the 120 and having to pay for the following month he stated the quantity is not matching what he's supposed to get. He also would like to know what can he take for pain due to the ibuprofen causing him to maintain a lot of fluid. Please call patient at the number listed above.

## 2024-06-21 NOTE — Assessment & Plan Note (Signed)
 Patient's blood pressure is controlled today at 136/88 asked him to continue with his home medications.  Will continue Coreg  25 mg tablet, spironolactone  25 mg tablet, Entresto  --Continue Coreg  25 mg 2 times tablet daily  --Continue Aldactone  25 mg daily --Continue Entresto  97-103 mg twice daily

## 2024-06-21 NOTE — Telephone Encounter (Signed)
 Copied from CRM #[id number]. Topic: Clinical - Medication Refill >> Jun 21, 2024  8:55 AM Zane F wrote: Patient is calling in stating that he is retaining a good deal of fluid and his weight has jumped up to 300 lbs. The patient stated that he is only being given 30 tablets each time he goes to refill despite his prescription listing the quantity of 180 tablets. Patient is in a good deal of pain. Patient has been out of the prescription for two weeks.

## 2024-06-21 NOTE — Telephone Encounter (Signed)
 Call to patient stated that he has ben out of his prescription for the Lasix  for 10 days.  Has a lot of fluid in his abdomen but none in hands feet or legs.  No shortness of breath.   States has been taking a prescribed 1 40 mg tablet 2 times a day.   Call to Pharmacy patient picked up Lasix  on 02/12/2024 60 tablets. 02/28/2024 got 180 tablets .  05/19/2024 got 60 tablets and then again on 05/31/2024 got 120 tablets.  Patient states he is now 300 lbs.   Offered an appointment for today and stated could not come in due to hurts to walk a lot.  SABRA

## 2024-06-21 NOTE — Assessment & Plan Note (Signed)
 Patient takes atorvastatin  40 mg daily.  Will get a lipid panel today --Continue taking atorvastatin  40 mg daily --Will get lipid panel today

## 2024-06-21 NOTE — Assessment & Plan Note (Signed)
 His last A1c was on 05/27/2023 at 6.4.  Will repeat A1c today. --Will get A1c today

## 2024-06-21 NOTE — Assessment & Plan Note (Signed)
 Patient's GFR from 05/27/2023 was 42.  Will get BMP today.  Restarted patient on Farxiga  and that should help him with his CKD. --Will get BMP today --Start Farxiga  5 mg daily

## 2024-06-21 NOTE — Progress Notes (Deleted)
 CC: Follow-up  HPI:  [name] is a 63 y.o. male living with a history stated below and presents today for follow-up. Please see problem based assessment and plan for additional details.  Past Medical History:  Diagnosis Date  . CHF (congestive heart failure) (HCC)   . Dyspnea   . History of hip replacement   . Hypertension     Current Outpatient Medications on File Prior to Visit  Medication Sig Dispense Refill  . apixaban  (ELIQUIS ) 5 MG TABS tablet Take 1 tablet (5 mg total) by mouth 2 (two) times daily. 60 tablet 5  . atorvastatin  (LIPITOR) 40 MG tablet Take 1 tablet (40 mg total) by mouth daily. 90 tablet 3  . carvedilol  (COREG ) 25 MG tablet Take 1 tablet (25 mg total) by mouth 2 (two) times daily. 60 tablet 10  . FARXIGA  10 MG TABS tablet TAKE 1 TABLET (10 MG TOTAL) BY MOUTH DAILY. NEEDS FOLLOW UP APPOINTMENT FOR MORE REFILLS 30 tablet 0  . furosemide  (LASIX ) 40 MG tablet TAKE 2 TABLETS BY MOUTH 2 (TWO) TIMES DAILY FOR 3 DAYS, THEN 1 TABLET (40 MG TOTAL) 2 (TWO) TIMES DAILY. 180 tablet 2  . potassium chloride  SA (KLOR-CON  M) 20 MEQ tablet TAKE 2 TABLETS (40 MEQ TOTAL) BY MOUTH DAILY. NEEDS FOLLOW UP APPOINTMENT FOR MORE REFILLS 180 tablet 0  . sacubitril -valsartan  (ENTRESTO ) 97-103 MG Take 1 tablet by mouth 2 (two) times daily. 60 tablet 11  . spironolactone  (ALDACTONE ) 25 MG tablet Take 1 tablet (25 mg total) by mouth daily. 90 tablet 3   No current facility-administered medications on file prior to visit.    Family History  Problem Relation Age of Onset  . Pulmonary Hypertension Mother   . Hypertension Mother   . CAD Father        s/p CABG x 4  . Heart failure Father   . Heart failure Sister   . Hypertension Sister     Social History   Socioeconomic History  . Marital status: Significant Other    Spouse name: Not on file  . Number of children: Not on file  . Years of education: Not on file  . Highest education level: Not on file  Occupational History   . Not on file  Tobacco Use  . Smoking status: Never  . Smokeless tobacco: Former    Types: Chew  . Tobacco comments:    Former chew/dip 12/23/21  Vaping Use  . Vaping status: Never Used  Substance and Sexual Activity  . Alcohol use: Yes    Alcohol/week: 4.0 - 8.0 standard drinks of alcohol    Types: 4 - 8 Cans of beer per week    Comment: 1-2 beers every other day 12/23/21  . Drug use: Not Currently  . Sexual activity: Not on file  Other Topics Concern  . Not on file  Social History Narrative  . Not on file   Social Drivers of Health   Financial Resource Strain: Not on file  Food Insecurity: No Food Insecurity (05/27/2023)   Hunger Vital Sign   . Worried About Programme researcher, broadcasting/film/video in the Last Year: Never true   . Ran Out of Food in the Last Year: Never true  Transportation Needs: Not on file  Physical Activity: Not on file  Stress: Not on file  Social Connections: Not on file  Intimate Partner Violence: Not At Risk (05/27/2023)   Humiliation, Afraid, Rape, and Kick questionnaire   . Fear of Current or  Ex-Partner: No   . Emotionally Abused: No   . Physically Abused: No   . Sexually Abused: No    Review of Systems: ROS   Vitals:   06/21/24 1506  BP: (!) 146/98  Pulse: 99  SpO2: 99%  Weight: (!) 312 lb 1.6 oz (141.6 kg)    Physical Exam: Physical Exam   Assessment & Plan:     Patient {GC/GE:3044014::discussed with,seen with} Dr. {WJFZD:[id number]::Tpoopjfd,Z. Hoffman,Mullen,Narendra,Vincent,Guilloud,Lau,Machen}  Assessment & Plan    No orders of the defined types were placed in this encounter.    Rebecka Pion, D.O. [facility] Internal Medicine, PGY-1 Date 06/21/2024 Time 3:11 PM

## 2024-06-21 NOTE — Patient Instructions (Addendum)
 You were seen for an acute visit today.  Please follow the instructions as discussed in today's plan: -- Start taking Farxiga  as prescribed --Start torsemide  40 mg: 1 tablet every day in the morning.  Another tablet as needed at night --Stop taking furosemide . --Continue other home medications as prescribed --Make an appointment to come see us  in 2 weeks.

## 2024-06-21 NOTE — Progress Notes (Signed)
 CC: Acute Visit  HPI:  [name] is a 63 y.o. male living with a history Biventricular HF, HTN, Stage 3b Chronic CKD, HLD, Atrial flutter who presents today for an acute visit. Pt mentioned that his abdomen has become larger this past week due to fluid build-up.  Patient said that he runs out of his Lasix  and needs more.  He said that initially when he feels fluid buildup in his body he takes more than 2 pills/day as his cardiologist said that he can take as many as he would like.  Patient said that he takes 4 or as many to get the fluid out of his body at times.  He also mentions smoking marijuana 2 days ago for hip pain relief.  Patient said that he also keeps himself physically active by doing cardio.  Please see assessment and plan for more details  Past Medical History:  Diagnosis Date   CHF (congestive heart failure) (HCC)    Dyspnea    History of hip replacement    Hypertension     Current Outpatient Medications on File Prior to Visit  Medication Sig Dispense Refill   apixaban  (ELIQUIS ) 5 MG TABS tablet Take 1 tablet (5 mg total) by mouth 2 (two) times daily. 60 tablet 5   atorvastatin  (LIPITOR) 40 MG tablet Take 1 tablet (40 mg total) by mouth daily. 90 tablet 3   carvedilol  (COREG ) 25 MG tablet Take 1 tablet (25 mg total) by mouth 2 (two) times daily. 60 tablet 10   FARXIGA  10 MG TABS tablet TAKE 1 TABLET (10 MG TOTAL) BY MOUTH DAILY. NEEDS FOLLOW UP APPOINTMENT FOR MORE REFILLS 30 tablet 0   furosemide  (LASIX ) 40 MG tablet TAKE 2 TABLETS BY MOUTH 2 (TWO) TIMES DAILY FOR 3 DAYS, THEN 1 TABLET (40 MG TOTAL) 2 (TWO) TIMES DAILY. 180 tablet 2   potassium chloride  SA (KLOR-CON  M) 20 MEQ tablet TAKE 2 TABLETS (40 MEQ TOTAL) BY MOUTH DAILY. NEEDS FOLLOW UP APPOINTMENT FOR MORE REFILLS 180 tablet 0   sacubitril -valsartan  (ENTRESTO ) 97-103 MG Take 1 tablet by mouth 2 (two) times daily. 60 tablet 11   spironolactone  (ALDACTONE ) 25 MG tablet Take 1 tablet (25 mg total) by mouth  daily. 90 tablet 3   No current facility-administered medications on file prior to visit.    Family History  Problem Relation Age of Onset   Pulmonary Hypertension Mother    Hypertension Mother    CAD Father        s/p CABG x 4   Heart failure Father    Heart failure Sister    Hypertension Sister     Social History   Socioeconomic History   Marital status: Significant Other    Spouse name: Not on file   Number of children: Not on file   Years of education: Not on file   Highest education level: Not on file  Occupational History   Not on file  Tobacco Use   Smoking status: Never   Smokeless tobacco: Former    Types: Chew   Tobacco comments:    Former chew/dip 12/23/21  Vaping Use   Vaping status: Never Used  Substance and Sexual Activity   Alcohol use: Yes    Alcohol/week: 4.0 - 8.0 standard drinks of alcohol    Types: 4 - 8 Cans of beer per week    Comment: 1-2 beers every other day 12/23/21   Drug use: Not Currently   Sexual activity: Not on file  Other Topics Concern   Not on file  Social History Narrative   Not on file   Social Drivers of Health   Financial Resource Strain: Not on file  Food Insecurity: No Food Insecurity (05/27/2023)   Hunger Vital Sign    Worried About Running Out of Food in the Last Year: Never true    Ran Out of Food in the Last Year: Never true  Transportation Needs: Not on file  Physical Activity: Not on file  Stress: Not on file  Social Connections: Not on file  Intimate Partner Violence: Not At Risk (05/27/2023)   Humiliation, Afraid, Rape, and Kick questionnaire    Fear of Current or Ex-Partner: No    Emotionally Abused: No    Physically Abused: No    Sexually Abused: No    Review of Systems: ROS  All pertinent review of systems in the HPI and plan Vitals:   06/21/24 1506  BP: (!) 146/98  Pulse: 99  SpO2: 99%  Weight: (!) 312 lb 1.6 oz (141.6 kg)    Physical Exam: Physical Exam Constitutional:      Appearance:  He is obese.  Cardiovascular:     Rate and Rhythm: Normal rate and regular rhythm.     Pulses: Normal pulses.     Heart sounds: Normal heart sounds.  Abdominal:     General: There is distension.     Tenderness: There is no abdominal tenderness. There is no guarding.     Comments: Patient's abdomen was severely distended  Musculoskeletal:     Right lower leg: Edema present.     Left lower leg: Edema present.     Comments: Mild bilateral lower extremity edema  Neurological:     Mental Status: He is alert.     Motor: No weakness.  Psychiatric:        Mood and Affect: Mood normal.      Assessment & Plan:     Patient seen with Dr. CHARLENA Eastern  Assessment & Plan Biventricular heart failure [facility]) Followed by cardiology who the patient last saw more than a year ago.  His last echo was on 11/22 that showed an EF of 60 to 65%.  Regimen for heart failure includes Entresto , Farxiga , Lasix , Aldactone .  Patient mentioned that he was told to stop taking Farxiga .  However, there has been a miscommunication per charting which shows that patient needs to be on Farxiga  in some documentations and the opposite in another.  We believe patient will benefit from Farxiga  for his biventricular heart failure and also his CKD.  Started patient back on Farxiga .  Patient has distended abdomen and some swelling in his bilateral lower extremities.  Furosemide  2 times a day does not seem to help him with diuresis.  We will discontinue furosemide  and start with torsemide  daily.  Asked patient to take 1 tablet of torsemide  every morning.  Asked patient to add another tablet of torsemide  in the evening as needed. --Continue Entresto  97-103 mg twice daily --Continue Aldactone  25 mg daily --Start Farxiga  5 mg daily --Start torsemide  40 mg tablet: 1 tablet in a.m. daily; another tablet in p.m. as needed --Discontinue Lasix  --Will get BMP today --Follow-up in 2 weeks Primary hypertension Patient's blood pressure is  controlled today at 136/88 asked him to continue with his home medications.  Will continue Coreg  25 mg tablet, spironolactone  25 mg tablet, Entresto  --Continue Coreg  25 mg 2 times tablet daily  --Continue Aldactone  25 mg daily --Continue Entresto  97-103 mg  twice daily Prediabetes His last A1c was on 05/27/2023 at 6.4.  Will repeat A1c today. --Will get A1c today Hyperlipidemia, unspecified hyperlipidemia type Patient takes atorvastatin  40 mg daily.  Will get a lipid panel today --Continue taking atorvastatin  40 mg daily --Will get lipid panel today Atrial flutter, unspecified type [facility]) Patient takes Eliquis  twice a day.  He denies any palpitations chest pain shortness of breath. --Continue taking Eliquis  5 mg 2 times a day Stage 3b chronic kidney disease (CKD) (HCC) Patient's GFR from 05/27/2023 was 42.  Will get BMP today.  Restarted patient on Farxiga  and that should help him with his CKD. --Will get BMP today --Start Farxiga  5 mg daily   No orders of the defined types were placed in this encounter.    Rebecka Pion, D.O. [facility] Internal Medicine, PGY-1 Date 06/21/2024 Time 3:20 PM

## 2024-06-21 NOTE — Telephone Encounter (Signed)
 Patient is scheduled to see Dr.Mclendon this afternoon.

## 2024-06-21 NOTE — Telephone Encounter (Signed)
 Needs appt

## 2024-06-21 NOTE — Assessment & Plan Note (Signed)
 Patient takes Eliquis  twice a day.  He denies any palpitations chest pain shortness of breath. --Continue taking Eliquis  5 mg 2 times a day

## 2024-06-22 ENCOUNTER — Other Ambulatory Visit: Payer: Self-pay

## 2024-06-22 ENCOUNTER — Other Ambulatory Visit: Payer: Self-pay | Admitting: *Deleted

## 2024-06-22 DIAGNOSIS — I5082 Biventricular heart failure: Secondary | ICD-10-CM

## 2024-06-22 DIAGNOSIS — I5022 Chronic systolic (congestive) heart failure: Secondary | ICD-10-CM

## 2024-06-22 LAB — BASIC METABOLIC PANEL WITH GFR
BUN/Creatinine Ratio: 14 (ref 10–24)
BUN: 15 mg/dL (ref 8–27)
CO2: 20 mmol/L (ref 20–29)
Calcium: 9.6 mg/dL (ref 8.6–10.2)
Chloride: 103 mmol/L (ref 96–106)
Creatinine, Ser: 1.07 mg/dL (ref 0.76–1.27)
Glucose: 94 mg/dL (ref 70–99)
Potassium: 5.2 mmol/L (ref 3.5–5.2)
Sodium: 138 mmol/L (ref 134–144)
eGFR: 77 mL/min/1.73 (ref >59)

## 2024-06-22 LAB — LIPID PANEL
Chol/HDL Ratio: 2.4 ratio (ref 0.0–5.0)
Cholesterol, Total: 117 mg/dL (ref 100–199)
HDL: 49 mg/dL (ref >39)
LDL Chol Calc (NIH): 53 mg/dL (ref 0–99)
Triglycerides: 73 mg/dL (ref 0–149)
VLDL Cholesterol Cal: 15 mg/dL (ref 5–40)

## 2024-06-22 LAB — HEMOGLOBIN A1C
Est. average glucose Bld gHb Est-mCnc: 128 mg/dL
Hgb A1c MFr Bld: 6.1 % — ABNORMAL HIGH (ref 4.8–5.6)

## 2024-06-22 MED ORDER — CARVEDILOL 25 MG PO TABS: 25.0000 mg | ORAL_TABLET | Freq: Two times a day (BID) | ORAL | 6 refills | Status: AC

## 2024-06-22 MED ORDER — TORSEMIDE 20 MG PO TABS: 40.0000 mg | ORAL_TABLET | Freq: Two times a day (BID) | ORAL | 3 refills | Status: DC | PRN

## 2024-06-22 MED ORDER — TORSEMIDE 20 MG PO TABS: 20.0000 mg | ORAL_TABLET | Freq: Two times a day (BID) | ORAL | 3 refills | Status: DC

## 2024-06-22 NOTE — Assessment & Plan Note (Signed)
 Patient was unable to get torsemide  40 mg tablets.  On inquiring with pharmacy he was told that 40 mg was considered brand-name as generic does not come in 40 mg.  Pharmacy recommended changing prescription to torsemide  20 mg take 2 tablets 2 times daily.  Plan: --Discontinued prescription for 40 mg twice daily --Started torsemide  20 mg 2 tablets in a.m. daily and 2 tablets in p.m. as needed.

## 2024-06-22 NOTE — Telephone Encounter (Addendum)
 Call from pt Upset new rx was not at pharmacy Upon further review of chart -New rx for torsemide  20mg  tabs take 2 tabs twice daily #120 was unable to be sent electronically due to line breaks and discrete and free text in sig. Therefore rx printed and was never received by pharmacy.  CMA resent rx without further issues after removing

## 2024-06-22 NOTE — Progress Notes (Signed)
 Assessment & Plan Biventricular heart failure (HCC) Patient was unable to get torsemide  40 mg tablets.  On inquiring with pharmacy he was told that 40 mg was considered brand-name as generic does not come in 40 mg.  Pharmacy recommended changing prescription to torsemide  20 mg take 2 tablets 2 times daily.  Plan: --Discontinued prescription for 40 mg twice daily --Started torsemide  20 mg 2 tablets in a.m. daily and 2 tablets in p.m. as needed.

## 2024-06-22 NOTE — Progress Notes (Signed)
 Internal Medicine Clinic Attending  I was physically present during the key portions of the resident provided service and participated in the medical decision making of patient's management care. I reviewed pertinent patient test results.  The assessment, diagnosis, and plan were formulated together and I agree with the documentation in the resident's note.  Rosan Dayton BROCKS, DO  This is a 63 yo male with PMH of HFrecEF on high dose lasix  with infrequent visits. Has had weight gain and out of lasix .  (Usually takes 80mg  every morning and occasionally 80 in afternoon), is on appropriate GDMT however as noted there was a misscommunication and he is no longer on farxiga .  We discussed potentailly change to torsemide  for better duration of action and restarting farxiga  both of which he is agreeable to, he will monitor his weight and follow up closely after these changes.

## 2024-06-22 NOTE — Telephone Encounter (Signed)
 Received call from pt (transferred for E2C2) pt was unable to get Torsemide  40mg  tabs.   Call to pharmacy- 40mg  considered brand name as generic does not come in 40mg  Pharmacy recommended changing rx to torsemide  20mg  take 2 tablets 2 times daily  #120 .  Will send to md to change rx

## 2024-06-22 NOTE — Addendum Note (Signed)
 Addended by: Simcha Speir C on: 06/22/2024 03:40 PM   Modules accepted: Orders

## 2024-06-30 ENCOUNTER — Ambulatory Visit: Payer: Self-pay

## 2024-06-30 NOTE — Telephone Encounter (Signed)
 Spoke to the pt about his cholesterol, A1c, and BMP numbers. Informed him that everything looks normal--good kidney function, no electrolyte abnormalities, he is in the pre-diabetic range, good LDL and total cholesterol levels.

## 2024-07-05 ENCOUNTER — Telehealth: Payer: Self-pay | Admitting: *Deleted

## 2024-07-05 ENCOUNTER — Ambulatory Visit: Attending: Cardiology | Admitting: Cardiovascular Disease

## 2024-07-05 ENCOUNTER — Encounter: Admitting: Student

## 2024-07-05 NOTE — Progress Notes (Deleted)
   CC: Heart failure follow-up.  HPI:  [name] is a 63 y.o. male living with a history stated below and presents today for ***.   Patient was last seen in resident [facility] about 2 weeks ago.  At that visit, was noted to be follow-up limited.  He was started back on Phexxi gel which was previously discontinued.  Dose of Lasix  was also adjusted to torsemide  40 mg twice a day.  Weight today       Please see problem based assessment and plan for additional details.  Past Medical History:  Diagnosis Date   CHF (congestive heart failure) (HCC)    Dyspnea    History of hip replacement    Hypertension     Current Outpatient Medications on File Prior to Visit  Medication Sig Dispense Refill   apixaban  (ELIQUIS ) 5 MG TABS tablet Take 1 tablet (5 mg total) by mouth 2 (two) times daily. 60 tablet 5   atorvastatin  (LIPITOR) 40 MG tablet Take 1 tablet (40 mg total) by mouth daily. 90 tablet 3   carvedilol  (COREG ) 25 MG tablet Take 1 tablet (25 mg total) by mouth 2 (two) times daily. 60 tablet 6   dapagliflozin  propanediol (FARXIGA ) 5 MG TABS tablet Take 1 tablet (5 mg total) by mouth daily. 90 tablet 3   potassium chloride  SA (KLOR-CON  M) 20 MEQ tablet TAKE 2 TABLETS (40 MEQ TOTAL) BY MOUTH DAILY. NEEDS FOLLOW UP APPOINTMENT FOR MORE REFILLS 180 tablet 0   sacubitril -valsartan  (ENTRESTO ) 97-103 MG Take 1 tablet by mouth 2 (two) times daily. 60 tablet 11   spironolactone  (ALDACTONE ) 25 MG tablet Take 1 tablet (25 mg total) by mouth daily. 90 tablet 3   torsemide  (DEMADEX ) 20 MG tablet Take 2 tablets (40 mg total) by mouth 2 (two) times daily as needed. 120 tablet 3   No current facility-administered medications on file prior to visit.    Review of Systems: ROS negative except for what is noted on the assessment and plan.  There were no vitals filed for this visit. {Labs (Optional):23779} {Vitals History (Optional):23777}  Physical Exam: Constitutional: NAD Cardiovascular: RRR,  no murmurs. Pulmonary/Chest: Clear bilateral lungs Abdominal: soft, non-tender, non-distended.  Assessment & Plan:   Patient {GC/GE:3044014::discussed with,seen with} Dr. {WJFZD:[id number]::Tpoopjfd,Z. Hoffman,Chambliss, Winfrey,Lau,Machen}  Assessment & Plan Biventricular heart failure (HCC) HF follow-up. GDMT regimen includes carvedilol  25 mg, Entresto  97-1 03, spironolactone  25 mg daily, and Farxiga  5 mg.  -Will assessment. -Will get a BMP.  Stage 3b chronic kidney disease (CKD) (HCC) Electrolytes are stable to weeks ago.  No orders of the defined types were placed in this encounter.   Missy Sandhoff, MD [facility] Internal Medicine, PGY-2  Date 07/05/2024 Time 11:08 AM

## 2024-07-05 NOTE — Assessment & Plan Note (Deleted)
 Electrolytes are stable to weeks ago.

## 2024-07-05 NOTE — Telephone Encounter (Signed)
 RTC to patient states thinks he may have Covid as his girlfriend has it and was exposed to her mother who has it.  Does not want to infect anyone else.  Has a 100 degree temperature.  Says is doing ok.  Has lost weight and is happy about that.  Did not want the doctor to call. Copied from CRM #[id number]. Topic: General - Other >> Jul 05, 2024 12:02 PM Susanna ORN wrote: Reason for CRM: Patient states that he's not feeling well and cancelled his appt for today. States he's been around someone that has the flu and he's now coughing & sneezing. States he does not want to come in and infect others. Patient states to let provider know that he has been taking the Torsemide  and he's doing well with it.

## 2024-07-05 NOTE — Assessment & Plan Note (Deleted)
 HF follow-up. GDMT regimen includes carvedilol  25 mg, Entresto  97-1 03, spironolactone  25 mg daily, and Farxiga  5 mg.  -Will assessment. -Will get a BMP.

## 2024-07-24 ENCOUNTER — Other Ambulatory Visit (HOSPITAL_COMMUNITY): Payer: Self-pay

## 2024-08-12 ENCOUNTER — Other Ambulatory Visit (HOSPITAL_COMMUNITY): Payer: Self-pay | Admitting: Internal Medicine

## 2024-08-14 ENCOUNTER — Telehealth (HOSPITAL_COMMUNITY): Payer: Self-pay

## 2024-08-14 NOTE — Telephone Encounter (Signed)
 Called to confirm/remind patient of their appointment at the [facility] on 08/15/24.   Appointment:   [x] Confirmed  [] Left mess   [] No answer/No voice mail  [] VM Full/unable to leave message  [] Phone not in service  Patient reminded to bring all medications and/or complete list.  Confirmed patient has transportation. Gave directions, instructed to utilize valet parking.

## 2024-08-15 ENCOUNTER — Ambulatory Visit (HOSPITAL_COMMUNITY)
Admission: RE | Admit: 2024-08-15 | Discharge: 2024-08-15 | Disposition: A | Discharge status: Home / Self Care | Source: Ambulatory Visit | Attending: Family Medicine | Admitting: Family Medicine

## 2024-08-15 ENCOUNTER — Encounter (HOSPITAL_COMMUNITY): Payer: Self-pay

## 2024-08-15 ENCOUNTER — Ambulatory Visit (HOSPITAL_COMMUNITY): Payer: Self-pay | Admitting: Family Medicine

## 2024-08-15 VITALS — BP 124/82 | HR 73 | Wt 304.0 lb

## 2024-08-15 DIAGNOSIS — I110 Hypertensive heart disease with heart failure: Secondary | ICD-10-CM | POA: Insufficient documentation

## 2024-08-15 DIAGNOSIS — I2510 Atherosclerotic heart disease of native coronary artery without angina pectoris: Secondary | ICD-10-CM | POA: Insufficient documentation

## 2024-08-15 DIAGNOSIS — F1010 Alcohol abuse, uncomplicated: Secondary | ICD-10-CM | POA: Insufficient documentation

## 2024-08-15 DIAGNOSIS — Z79899 Other long term (current) drug therapy: Secondary | ICD-10-CM | POA: Diagnosis not present

## 2024-08-15 DIAGNOSIS — I4892 Unspecified atrial flutter: Secondary | ICD-10-CM | POA: Diagnosis present

## 2024-08-15 DIAGNOSIS — I5082 Biventricular heart failure: Secondary | ICD-10-CM | POA: Insufficient documentation

## 2024-08-15 DIAGNOSIS — R0683 Snoring: Secondary | ICD-10-CM

## 2024-08-15 DIAGNOSIS — Z86718 Personal history of other venous thrombosis and embolism: Secondary | ICD-10-CM | POA: Insufficient documentation

## 2024-08-15 DIAGNOSIS — I5022 Chronic systolic (congestive) heart failure: Secondary | ICD-10-CM | POA: Insufficient documentation

## 2024-08-15 DIAGNOSIS — I10 Essential (primary) hypertension: Secondary | ICD-10-CM | POA: Diagnosis not present

## 2024-08-15 DIAGNOSIS — Z7901 Long term (current) use of anticoagulants: Secondary | ICD-10-CM | POA: Insufficient documentation

## 2024-08-15 LAB — BASIC METABOLIC PANEL WITH GFR
Anion gap: 14 (ref 5–15)
BUN: 23 mg/dL (ref 8–23)
CO2: 29 mmol/L (ref 22–32)
Calcium: 9.2 mg/dL (ref 8.9–10.3)
Chloride: 93 mmol/L — ABNORMAL LOW (ref 98–111)
Creatinine, Ser: 1.39 mg/dL — ABNORMAL HIGH (ref 0.61–1.24)
GFR, Estimated: 57 mL/min — ABNORMAL LOW (ref >60)
Glucose, Bld: 100 mg/dL — ABNORMAL HIGH (ref 70–99)
Potassium: 3.8 mmol/L (ref 3.5–5.1)
Sodium: 136 mmol/L (ref 135–145)

## 2024-08-15 LAB — BRAIN NATRIURETIC PEPTIDE: B Natriuretic Peptide: 106.4 pg/mL — ABNORMAL HIGH (ref 0.0–100.0)

## 2024-08-15 MED ORDER — SPIRONOLACTONE 25 MG PO TABS: 25.0000 mg | ORAL_TABLET | Freq: Every day | ORAL | 3 refills | Status: AC

## 2024-08-15 MED ORDER — APIXABAN 5 MG PO TABS: 5.0000 mg | ORAL_TABLET | Freq: Two times a day (BID) | ORAL | 4 refills | Status: AC

## 2024-08-15 NOTE — Patient Instructions (Addendum)
 Good to see you today!  RESTART Spironolactone  25 mg daily  STOP Potassium   RESTART Eliquis  5 mg Twice daily  Your physician has requested that you have an echocardiogram. Echocardiography is a painless test that uses sound waves to create images of your heart. It provides your doctor with information about the size and shape of your heart and how well your heart's chambers and valves are working. This procedure takes approximately one hour. There are no restrictions for this procedure. Please do NOT wear cologne, perfume, aftershave, or lotions (deodorant is allowed). Please arrive 15 minutes prior to your appointment time.  Please note: We ask at that you not bring children with you during ultrasound (echo/ vascular) testing. Due to room size and safety concerns, children are not allowed in the ultrasound rooms during exams. Our front office staff cannot provide observation of children in our lobby area while testing is being conducted. An adult accompanying a patient to their appointment will only be allowed in the ultrasound room at the discretion of the ultrasound technician under special circumstances. We apologize for any inconvenience.  Labs done today, your results will be available in MyChart, we will contact you for abnormal readings.  Follow up lab work in 1 week  Your physician recommends that you schedule a follow-up appointment as scheduled  Please follow up with Dr. Nancey schedule an appointment   If you have any questions or concerns before your next appointment please send us  a message through [place] or call our office at [phone].    TO LEAVE A MESSAGE FOR THE NURSE SELECT OPTION 2, PLEASE LEAVE A MESSAGE INCLUDING: YOUR NAME DATE OF BIRTH CALL BACK NUMBER REASON FOR CALL**this is important as we prioritize the call backs  YOU WILL RECEIVE A CALL BACK THE SAME DAY AS LONG AS YOU CALL BEFORE 4:00 PM At the [facility], you and your health  needs are our priority. As part of our continuing mission to provide you with exceptional heart care, we have created designated Provider Care Teams. These Care Teams include your primary Cardiologist (physician) and Advanced Practice Providers (APPs- Physician Assistants and Nurse Practitioners) who all work together to provide you with the care you need, when you need it.   You may see any of the following providers on your designated Care Team at your next follow up: Dr Toribio Fuel Dr Ezra Shuck Dr. Ria Commander Dr. Morene Brownie Amy Lenetta, NP Caffie Shed, [place] [facility] Lindsay, [place] Beckey Coe, NP [place] Lee, NP Ellouise Class, NP Tinnie Redman, PharmD Jaun Bash, PharmD   Please be sure to bring in all your medications bottles to every appointment.    Thank you for choosing  [facility]

## 2024-08-15 NOTE — Progress Notes (Signed)
 Advanced Heart Failure Clinic Note  PCP: Napoleon Limes, MD HF Cardiologist: Dr. Cherrie  HPI: Paul [name] is a 63 y.o.male with obesity, HTN and ETOH use. Admitted in 11/21 with new onset HF.    Echo biventricular heart failure. LVEF 20-25% w/ global hypokinesis, mild LVH. RV systolic function moderately reduced. There is an LV apical thrombus measuring 2.4 cm x 1.3 cm.   - cMRI Severe LVE EF 22%. 1 cm x 1.4 cm mobile apical thrombus. Mild RVE with moderate reduction in RVEF 31%. Combination of mid/basal hypertrophy poor nulling of myocardium, elevated ECV and mildly elevated T1 suggests possibility of infiltrative cardiomyopathy.   - PYP 12/21 negative  - LHC 10/17/20  Prox LAD to Mid LAD lesion is 30% stenosed. Mid LAD lesion is 30% stenosed. Dist LAD lesion is 30% stenosed. Apical LAD with diffuse disease 1st Diag lesion is 70% stenosed  Developed NSVT and LifeVest placed.  Echo 2/22 EF 50% RV normal.   Echo 11/22 EF 55-60% mod LVH G1 DD   S/p AF ablation 11/2022  Today he returns for HF follow up. We have not seen him since 09/2021. Overall feeling fine. He is not SOB with activity, broke up with GF so now less life stress. Denies palpitations, abnormal bleeding, CP, dizziness, edema, or PND/Orthopnea. Appetite ok. Weight at home 298 pounds. Off Eliquis , Farxiga  and spiro. Takes torsemide  weekly with good diuresis.   ROS: All systems negative except as listed in HPI, PMH and Problem List.  SH:  Social History   Socioeconomic History   Marital status: Significant Other    Spouse name: Not on file   Number of children: Not on file   Years of education: Not on file   Highest education level: Not on file  Occupational History   Not on file  Tobacco Use   Smoking status: Never   Smokeless tobacco: Former    Types: Chew   Tobacco comments:    Former chew/dip 12/23/21  Vaping Use   Vaping status: Never Used  Substance and Sexual Activity   Alcohol use: Yes     Alcohol/week: 4.0 - 8.0 standard drinks of alcohol    Types: 4 - 8 Cans of beer per week    Comment: 1-2 beers every other day 12/23/21   Drug use: Not Currently   Sexual activity: Not on file  Other Topics Concern   Not on file  Social History Narrative   Not on file   Social Drivers of Health   Financial Resource Strain: Not on file  Food Insecurity: No Food Insecurity (05/27/2023)   Hunger Vital Sign    Worried About Running Out of Food in the Last Year: Never true    Ran Out of Food in the Last Year: Never true  Transportation Needs: Not on file  Physical Activity: Not on file  Stress: Not on file  Social Connections: Not on file  Intimate Partner Violence: Not At Risk (05/27/2023)   Humiliation, Afraid, Rape, and Kick questionnaire    Fear of Current or Ex-Partner: No    Emotionally Abused: No    Physically Abused: No    Sexually Abused: No   FH:  Family History  Problem Relation Age of Onset   Pulmonary Hypertension Mother    Hypertension Mother    CAD Father        s/p CABG x 4   Heart failure Father    Heart failure Sister    Hypertension Sister  Past Medical History:  Diagnosis Date   CHF (congestive heart failure) (HCC)    Dyspnea    History of hip replacement    Hypertension    Current Outpatient Medications  Medication Sig Dispense Refill   atorvastatin  (LIPITOR) 40 MG tablet Take 1 tablet (40 mg total) by mouth daily. 90 tablet 3   carvedilol  (COREG ) 25 MG tablet Take 1 tablet (25 mg total) by mouth 2 (two) times daily. 60 tablet 6   potassium chloride  SA (KLOR-CON  M) 20 MEQ tablet TAKE 2 TABLETS (40 MEQ TOTAL) BY MOUTH DAILY. NEEDS FOLLOW UP APPOINTMENT FOR MORE REFILLS 180 tablet 0   sacubitril -valsartan  (ENTRESTO ) 97-103 MG Take 1 tablet by mouth 2 (two) times daily. 60 tablet 11   torsemide  (DEMADEX ) 20 MG tablet Take 2 tablets (40 mg total) by mouth 2 (two) times daily as needed. 120 tablet 3   apixaban  (ELIQUIS ) 5 MG TABS tablet Take 1  tablet (5 mg total) by mouth 2 (two) times daily. (Patient not taking: Reported on 08/15/2024) 60 tablet 5   dapagliflozin  propanediol (FARXIGA ) 5 MG TABS tablet Take 1 tablet (5 mg total) by mouth daily. 90 tablet 3   spironolactone  (ALDACTONE ) 25 MG tablet Take 1 tablet (25 mg total) by mouth daily. (Patient not taking: Reported on 08/15/2024) 90 tablet 3   No current facility-administered medications for this encounter.   BP 124/82   Pulse 73   Wt (!) 137.9 kg (304 lb)   SpO2 97%   BMI 38.00 kg/m   Wt Readings from Last 3 Encounters:  08/15/24 (!) 137.9 kg (304 lb)  06/21/24 (!) 141.6 kg (312 lb 1.6 oz)  11/25/23 (!) 136.6 kg (301 lb 3.2 oz)   PHYSICAL EXAM: General:  NAD. No resp difficulty, walked into clinic HEENT: Normal Neck: Supple. No JVD. Thick neck Cor: Regular rate & rhythm. No rubs, gallops or murmurs. Lungs: Clear Abdomen: Soft, nontender, nondistended.  Extremities: No cyanosis, clubbing, rash, edema Neuro: Alert & oriented x 3, moves all 4 extremities w/o difficulty. Affect pleasant.  ECG (personally reviewed): NSR  ASSESSMENT & PLAN: 1. Chronic Systolic Heart Failure - Admitted (11/21) with acute systolic HF. Markedly hypertensive in ED. HS trop 113>>93. EKG sinus tach w/ PACs - Echo (11/21): LVEF 20-25% w/ global HK, mild LVH and moderately reduced RV (no prior study for comparison)  - cMRI (12/21): Severe LVE EF 22%. 1 cm x 1.4 cm mobile apical thrombus. Mild RVE with moderate reduction in RVEF 31%. Combination of mid/basal hypertrophy poor nulling of myocardium, elevated ECV and mildly elevated T1 suggests possibility of infiltrative cardiomyopathy.  - PYP (12/21) negative. - Cath with mild CAD and elevated filling pressures. - Echo (2/22): EF 45-50% RV normal. - Echo  09/16/21 EF 55-60% mod LVH G1 DD.  - NYHA I, volume looks ok. - Restart siro 25 mg daily. - Stop daily KCL. - Continue torsemide  40 mg PRN .  - Continue carvedilol  25 mg bid - Continue  Entresto  97-103 mg bid. - he stopped SGLT2i x years. Hold off restarting for now. - Labs today. Repeat BMET in 1 week - Repeat echo.  2. CAD, mild non-obstructive - LHC 10/17/20  Prox LAD to Mid LAD lesion is 30% stenosed. Mid LAD lesion is 30% stenosed. Dist LAD lesion is 30% stenosed. Apical LAD with diffuse disease 1st Diag lesion is 70% stenosed.  - No chest pain - Continue statin  - Off ASA with AC   3. 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 HTN - BP stable - Meds as above  5. LV Thrombus  - Resolved.    6. ETOH Abuse  - Cessation advised.  7. Probable OSA - Not interested in sleep study.  Follow up in 3 months with APP.  Harlene CHRISTELLA Gainer, FNP  08/15/24

## 2024-08-22 ENCOUNTER — Other Ambulatory Visit (HOSPITAL_COMMUNITY)

## 2024-09-04 ENCOUNTER — Other Ambulatory Visit: Payer: Self-pay

## 2024-09-04 ENCOUNTER — Telehealth: Payer: Self-pay

## 2024-09-04 MED ORDER — ATORVASTATIN CALCIUM 40 MG PO TABS: 40.0000 mg | ORAL_TABLET | Freq: Every day | ORAL | 3 refills | Status: AC

## 2024-09-04 MED ORDER — SACUBITRIL-VALSARTAN 97-103 MG PO TABS: 1.0000 | ORAL_TABLET | Freq: Two times a day (BID) | ORAL | 11 refills | Status: DC

## 2024-09-04 NOTE — Telephone Encounter (Signed)
 Pharmacy is requesting a new rx to be sent in by a provider [place].   Medication sent to pharmacy.

## 2024-09-04 NOTE — Telephone Encounter (Signed)
 Prior Authorization for patient (Sacubitril -Valsartan  97-103MG  tablets) came through on cover my meds was submitted with last office notes awaiting approval or denial.  KEY:[id number]

## 2024-09-05 NOTE — Telephone Encounter (Signed)
 Paul [name] (Key: [id number]) PA Case ID #: [id number] Rx #: [id number] Need Help? Call us  at [phone] Outcome Approved today by [facility] Medicaid 2017 Approved. This drug is covered on the Preferred Drug List. It does not require prior approval. Please call the pharmacy to process the claim. Effective Date: 09/05/2024 Authorization Expiration Date: 11/15/2024 Drug Sacubitril -Valsartan  97-103MG  tablets ePA cloud logo Form [employer] [place]  Electronic Prior Authorization Request Form (2017 [place]) Original Claim Info 75 NON-PREFERRED AGENT. IF PREFERRED ALTERNATIVE CANNOT BE USED, PA RQRD. PLEASE CALLSubmit 3 DS For Emerg Fill with PA Type01, PA Number [id number], Level of Service 3  Lvm for the patient regarding the approval.

## 2024-09-08 ENCOUNTER — Other Ambulatory Visit (HOSPITAL_COMMUNITY): Payer: Self-pay

## 2024-09-08 ENCOUNTER — Other Ambulatory Visit (HOSPITAL_COMMUNITY): Payer: Self-pay | Admitting: Cardiology

## 2024-09-08 MED ORDER — ENTRESTO 97-103 MG PO TABS: 1.0000 | ORAL_TABLET | Freq: Two times a day (BID) | ORAL | 11 refills | Status: AC

## 2024-09-08 MED ORDER — SACUBITRIL-VALSARTAN 97-103 MG PO TABS: 1.0000 | ORAL_TABLET | Freq: Two times a day (BID) | ORAL | 11 refills | Status: DC

## 2024-09-15 ENCOUNTER — Ambulatory Visit (HOSPITAL_COMMUNITY): Admission: RE | Admit: 2024-09-15 | Source: Ambulatory Visit

## 2024-09-15 ENCOUNTER — Other Ambulatory Visit (HOSPITAL_COMMUNITY): Payer: Self-pay

## 2024-10-03 ENCOUNTER — Ambulatory Visit: Attending: Student | Admitting: Student

## 2024-10-03 ENCOUNTER — Encounter: Payer: Self-pay | Admitting: Student

## 2024-10-03 DIAGNOSIS — D6869 Other thrombophilia: Secondary | ICD-10-CM

## 2024-10-03 DIAGNOSIS — I483 Typical atrial flutter: Secondary | ICD-10-CM

## 2024-10-03 NOTE — Progress Notes (Unsigned)
  Electrophysiology Office Note:   Date:  10/03/2024  ID:  Paul [name], DOB 1961-07-01, MRN [id number]  Primary Cardiologist: None Electrophysiologist: Eulas FORBES Furbish, MD   Electrophysiologist:  Eulas FORBES Furbish, MD  Advanced Heart Failure:  Toribio Fuel, MD  {Click to update primary MD,subspecialty MD or APP then REFRESH:1}    History of Present Illness:   Paul [name] is a 63 y.o. male with h/o HFrEF, CAD, OSA, HTN, paroxysmal AF s/p ablation by Dr. Furbish on 12/03/2022 seen today for 1 year follow-up.  Paul [name] was last seen by Dr. Furbish on 01/04/2023 for annual follow-up.  During his visit he was maintaining sinus rhythm and had Cardizem  and digoxin  discontinue.  He was seen most recently on 08/15/2024 by the advanced heart failure clinic and reported feeling well overall and denied any palpitations or abnormal bleeding.  He was maintaining sinus rhythm on EKG and was off of his Eliquis  which was restarted at that time.  During today's visit Paul [name] reports***    Review of systems complete and found to be negative unless listed in HPI.   EP Information / Studies Reviewed:    {EKGtoday:28818}   Arrhythmia/Device History No specialty comments available.   Physical Exam:   VS:  There were no vitals taken for this visit.   Wt Readings from Last 3 Encounters:  08/15/24 (!) 304 lb (137.9 kg)  06/21/24 (!) 312 lb 1.6 oz (141.6 kg)  11/25/23 (!) 301 lb 3.2 oz (136.6 kg)     GEN: No acute distress NECK: No JVD; No carotid bruits CARDIAC: {EPRHYTHM:28826}, no murmurs, rubs, gallops RESPIRATORY:  Clear to auscultation without rales, wheezing or rhonchi  ABDOMEN: Soft, non-tender, non-distended EXTREMITIES:  {EDEMA LEVEL:28147::No} edema; No deformity   ASSESSMENT AND PLAN:     CHA2DS2-VASc Score = 2  {Confirm score is correct.  If not, click here to update score.  REFRESH note.  :1} This indicates a 2.2% annual risk of stroke. The patient's score is based  upon: CHF History: 1 HTN History: 1 Diabetes History: 0 Stroke History: 0 Vascular Disease History: 0 Age Score: 0 Gender Score: 0   {This patient has a significant risk of stroke if diagnosed with atrial fibrillation.  Please consider VKA or DOAC agent for anticoagulation if the bleeding risk is acceptable.   You can also use the SmartPhrase .[email or web address] for documentation.   :[id number]}   Typical atrial flutter: - s/p ablation by Dr. Furbish on 12/03/2022 and today reports*** - He previously discontinued his Eliquis  which was restarted in September and is currently*** -Continue Eliquis  5 mg twice daily  HFrEF: - Continue current treatment plan per advanced CHF clinic  Secondary hypercoagulable state: -Hx of LV thrombus -Patient denies any inappropriate bleeding*** - Hemoglobin and creatinine both stable with appropriate dose  Patient will follow-up as needed    @STWREVIEWED @  Follow up with {EPMDS:28135::EP Team} {EPFOLLOW LE:71826}  Signed, Wyn Raddle, Jackee Shove, NP

## 2024-10-04 NOTE — Progress Notes (Signed)
 Pt no show. Not seen.

## 2024-10-27 ENCOUNTER — Telehealth: Payer: Self-pay | Admitting: *Deleted

## 2024-10-27 NOTE — Telephone Encounter (Signed)
 Received faxed refill request from [facility] for entresto   Pt already has rx on file No further action needed

## 2024-11-14 ENCOUNTER — Encounter (HOSPITAL_COMMUNITY)

## 2024-11-14 ENCOUNTER — Telehealth (HOSPITAL_COMMUNITY): Payer: Self-pay

## 2024-11-14 NOTE — Progress Notes (Incomplete)
 "  Advanced Heart Failure Clinic Note  PCP: Napoleon Limes, MD HF Cardiologist: Dr. Cherrie  HPI: Paul [name] is a 63 y.o. male with obesity, HTN and ETOH use. Admitted in 11/21 with new onset HF.    Echo biventricular heart failure. LVEF 20-25% w/ global hypokinesis, mild LVH. RV systolic function moderately reduced. There is an LV apical thrombus measuring 2.4 cm x 1.3 cm.   - cMRI Severe LVE EF 22%. 1 cm x 1.4 cm mobile apical thrombus. Mild RVE with moderate reduction in RVEF 31%. Combination of mid/basal hypertrophy poor nulling of myocardium, elevated ECV and mildly elevated T1 suggests possibility of infiltrative cardiomyopathy.   - PYP 12/21 negative  - LHC 10/17/20  Prox LAD to Mid LAD lesion is 30% stenosed. Mid LAD lesion is 30% stenosed. Dist LAD lesion is 30% stenosed. Apical LAD with diffuse disease 1st Diag lesion is 70% stenosed  Developed NSVT and LifeVest placed.  Echo 2/22 EF 50% RV normal.   Echo 11/22 EF 55-60% mod LVH G1 DD   S/p AF ablation 11/2022  Today he returns for HF follow up. We have not seen him since 09/2021. Overall feeling fine. He is not SOB with activity, broke up with GF so now less life stress. Denies palpitations, abnormal bleeding, CP, dizziness, edema, or PND/Orthopnea. Appetite ok. Weight at home 298 pounds. Off Eliquis , Farxiga  and spiro. Takes torsemide  weekly with good diuresis.   ROS: All systems negative except as listed in HPI, PMH and Problem List.  SH:  Social History   Socioeconomic History   Marital status: Significant Other    Spouse name: Not on file   Number of children: Not on file   Years of education: Not on file   Highest education level: Not on file  Occupational History   Not on file  Tobacco Use   Smoking status: Never   Smokeless tobacco: Former    Types: Chew   Tobacco comments:    Former chew/dip 12/23/21  Vaping Use   Vaping status: Never Used  Substance and Sexual Activity   Alcohol use: Yes     Alcohol/week: 4.0 - 8.0 standard drinks of alcohol    Types: 4 - 8 Cans of beer per week    Comment: 1-2 beers every other day 12/23/21   Drug use: Not Currently   Sexual activity: Not on file  Other Topics Concern   Not on file  Social History Narrative   Not on file   Social Drivers of Health   Tobacco Use: Medium Risk (08/15/2024)   Patient History    Smoking Tobacco Use: Never    Smokeless Tobacco Use: Former    Passive Exposure: Not on Actuary Strain: Not on file  Food Insecurity: No Food Insecurity (05/27/2023)   Hunger Vital Sign    Worried About Running Out of Food in the Last Year: Never true    Ran Out of Food in the Last Year: Never true  Transportation Needs: Not on file  Physical Activity: Not on file  Stress: Not on file  Social Connections: Not on file  Intimate Partner Violence: Not At Risk (05/27/2023)   Humiliation, Afraid, Rape, and Kick questionnaire    Fear of Current or Ex-Partner: No    Emotionally Abused: No    Physically Abused: No    Sexually Abused: No  Depression (PHQ2-9): Low Risk (11/25/2023)   Depression (PHQ2-9)    PHQ-2 Score: 0  Alcohol Screen: Not on file  Housing: Low Risk (05/27/2023)   Housing    Last Housing Risk Score: 0  Utilities: Not on file  Health Literacy: Not on file   FH:  Family History  Problem Relation Age of Onset   Pulmonary Hypertension Mother    Hypertension Mother    CAD Father        s/p CABG x 4   Heart failure Father    Heart failure Sister    Hypertension Sister    Past Medical History:  Diagnosis Date   CHF (congestive heart failure) (HCC)    Dyspnea    History of hip replacement    Hypertension    Current Outpatient Medications  Medication Sig Dispense Refill   apixaban  (ELIQUIS ) 5 MG TABS tablet Take 1 tablet (5 mg total) by mouth 2 (two) times daily. 90 tablet 4   atorvastatin  (LIPITOR) 40 MG tablet Take 1 tablet (40 mg total) by mouth daily. 90 tablet 3   carvedilol  (COREG )  25 MG tablet Take 1 tablet (25 mg total) by mouth 2 (two) times daily. 60 tablet 6   dapagliflozin  propanediol (FARXIGA ) 5 MG TABS tablet Take 1 tablet (5 mg total) by mouth daily. 90 tablet 3   ENTRESTO  97-103 MG Take 1 tablet by mouth 2 (two) times daily. 60 tablet 11   spironolactone  (ALDACTONE ) 25 MG tablet Take 1 tablet (25 mg total) by mouth daily. 90 tablet 3   torsemide  (DEMADEX ) 20 MG tablet Take 2 tablets (40 mg total) by mouth 2 (two) times daily as needed. 120 tablet 3   No current facility-administered medications for this visit.   There were no vitals taken for this visit.  Wt Readings from Last 3 Encounters:  08/15/24 (!) 137.9 kg (304 lb)  06/21/24 (!) 141.6 kg (312 lb 1.6 oz)  11/25/23 (!) 136.6 kg (301 lb 3.2 oz)   PHYSICAL EXAM: General:  NAD. No resp difficulty, walked into clinic HEENT: Normal Neck: Supple. No JVD. Thick neck Cor: Regular rate & rhythm. No rubs, gallops or murmurs. Lungs: Clear Abdomen: Soft, nontender, nondistended.  Extremities: No cyanosis, clubbing, rash, edema Neuro: Alert & oriented x 3, moves all 4 extremities w/o difficulty. Affect pleasant.  ECG (personally reviewed): NSR  ASSESSMENT & PLAN: 1. Chronic Systolic Heart Failure - Admitted (11/21) with acute systolic HF. Markedly hypertensive in ED. HS trop 113>>93. EKG sinus tach w/ PACs - Echo (11/21): LVEF 20-25% w/ global HK, mild LVH and moderately reduced RV (no prior study for comparison)  - cMRI (12/21): Severe LVE EF 22%. 1 cm x 1.4 cm mobile apical thrombus. Mild RVE with moderate reduction in RVEF 31%. Combination of mid/basal hypertrophy poor nulling of myocardium, elevated ECV and mildly elevated T1 suggests possibility of infiltrative cardiomyopathy.  - PYP (12/21) negative. - Cath with mild CAD and elevated filling pressures. - Echo (2/22): EF 45-50% RV normal. - Echo  09/16/21 EF 55-60% mod LVH G1 DD.  - NYHA I, volume looks ok. - Restart siro 25 mg daily. - Stop daily  KCL. - Continue torsemide  40 mg PRN .  - Continue carvedilol  25 mg bid - Continue Entresto  97-103 mg bid. - he stopped SGLT2i x years. Hold off restarting for now. - Labs today. Repeat BMET in 1 week - Repeat echo.  2. CAD, mild non-obstructive - LHC 10/17/20  Prox LAD to Mid LAD lesion is 30% stenosed. Mid LAD lesion is 30% stenosed. Dist LAD lesion is 30% stenosed. Apical LAD with diffuse disease 1st Diag  lesion is 70% stenosed.  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 HTN - BP stable - Meds as above  5. LV Thrombus  - Resolved.    6. ETOH Abuse  - Cessation advised.  7. Probable OSA - Not interested in sleep study.  Follow up in 3 months with APP.  Harlene CHRISTELLA Gainer, FNP  11/14/2024 "

## 2024-11-14 NOTE — Telephone Encounter (Signed)
 Called to confirm/remind patient of their appointment at the Advanced Heart Failure Clinic on 11/14/24.   Appointment:   [] Confirmed  [x] Left mess   [] No answer/No voice mail  [] VM Full/unable to leave message  [] Phone not in service  And to bring in all medications and/or complete list.

## 2024-11-30 ENCOUNTER — Other Ambulatory Visit (HOSPITAL_COMMUNITY): Payer: Self-pay | Admitting: Cardiology

## 2024-11-30 MED ORDER — TORSEMIDE 20 MG PO TABS: 40.0000 mg | ORAL_TABLET | Freq: Two times a day (BID) | ORAL | 3 refills | Status: AC | PRN
